# Patient Record
Sex: Male | Born: 1960 | Hispanic: Yes | Smoking: Current every day smoker
Health system: Southern US, Community
[De-identification: ages and names within clinical notes are randomized; demographics above are authoritative.]

## PROBLEM LIST (undated history)

## (undated) DIAGNOSIS — I259 Chronic ischemic heart disease, unspecified: Secondary | ICD-10-CM

## (undated) DIAGNOSIS — F329 Major depressive disorder, single episode, unspecified: Secondary | ICD-10-CM

## (undated) DIAGNOSIS — E7849 Other hyperlipidemia: Secondary | ICD-10-CM

## (undated) DIAGNOSIS — M1990 Unspecified osteoarthritis, unspecified site: Secondary | ICD-10-CM

## (undated) DIAGNOSIS — I255 Ischemic cardiomyopathy: Secondary | ICD-10-CM

## (undated) DIAGNOSIS — I509 Heart failure, unspecified: Secondary | ICD-10-CM

## (undated) DIAGNOSIS — K5792 Diverticulitis of intestine, part unspecified, without perforation or abscess without bleeding: Secondary | ICD-10-CM

## (undated) DIAGNOSIS — E119 Type 2 diabetes mellitus without complications: Secondary | ICD-10-CM

## (undated) DIAGNOSIS — I219 Acute myocardial infarction, unspecified: Secondary | ICD-10-CM

## (undated) DIAGNOSIS — F32A Depression, unspecified: Secondary | ICD-10-CM

## (undated) DIAGNOSIS — I2510 Atherosclerotic heart disease of native coronary artery without angina pectoris: Secondary | ICD-10-CM

## (undated) DIAGNOSIS — E785 Hyperlipidemia, unspecified: Secondary | ICD-10-CM

## (undated) DIAGNOSIS — I10 Essential (primary) hypertension: Secondary | ICD-10-CM

## (undated) DIAGNOSIS — A6920 Lyme disease, unspecified: Secondary | ICD-10-CM

## (undated) HISTORY — DX: Diverticulitis of intestine, part unspecified, without perforation or abscess without bleeding: K57.92

## (undated) HISTORY — PX: KNEE SURGERY: SHX244

## (undated) HISTORY — PX: HERNIA REPAIR: SHX51

## (undated) HISTORY — DX: Ischemic cardiomyopathy: I25.5

## (undated) HISTORY — DX: Other hyperlipidemia: E78.49

## (undated) HISTORY — PX: TONSILLECTOMY: SUR1361

## (undated) HISTORY — DX: Chronic ischemic heart disease, unspecified: I25.9

## (undated) HISTORY — DX: Atherosclerotic heart disease of native coronary artery without angina pectoris: I25.10

## (undated) HISTORY — DX: Heart failure, unspecified: I50.9

## (undated) HISTORY — PX: SMALL INTESTINE SURGERY: SHX150

## (undated) HISTORY — DX: Unspecified osteoarthritis, unspecified site: M19.90

## (undated) HISTORY — PX: JOINT REPLACEMENT: SHX530

## (undated) HISTORY — DX: Type 2 diabetes mellitus without complications: E11.9

## (undated) HISTORY — DX: Acute myocardial infarction, unspecified: I21.9

## (undated) HISTORY — DX: Lyme disease, unspecified: A69.20

## (undated) HISTORY — DX: Hyperlipidemia, unspecified: E78.5

## (undated) HISTORY — DX: Essential (primary) hypertension: I10

---

## 2010-10-29 HISTORY — PX: CARDIAC CATHETERIZATION: SHX172

## 2012-10-29 HISTORY — PX: CARDIAC CATHETERIZATION: SHX172

## 2016-10-29 DIAGNOSIS — A6920 Lyme disease, unspecified: Secondary | ICD-10-CM

## 2016-10-29 HISTORY — DX: Lyme disease, unspecified: A69.20

## 2017-06-11 ENCOUNTER — Encounter: Payer: Self-pay | Admitting: Family Medicine

## 2017-06-14 ENCOUNTER — Encounter: Payer: Self-pay | Admitting: Emergency Medicine

## 2017-06-14 ENCOUNTER — Ambulatory Visit (INDEPENDENT_AMBULATORY_CARE_PROVIDER_SITE_OTHER): Payer: Medicare Other | Admitting: Emergency Medicine

## 2017-06-14 VITALS — BP 112/74 | HR 73 | Temp 97.8°F | Resp 16 | Ht 71.0 in | Wt 184.8 lb

## 2017-06-14 DIAGNOSIS — I10 Essential (primary) hypertension: Secondary | ICD-10-CM

## 2017-06-14 DIAGNOSIS — G894 Chronic pain syndrome: Secondary | ICD-10-CM

## 2017-06-14 DIAGNOSIS — I2510 Atherosclerotic heart disease of native coronary artery without angina pectoris: Secondary | ICD-10-CM | POA: Diagnosis not present

## 2017-06-14 DIAGNOSIS — E785 Hyperlipidemia, unspecified: Secondary | ICD-10-CM | POA: Diagnosis not present

## 2017-06-14 DIAGNOSIS — I259 Chronic ischemic heart disease, unspecified: Secondary | ICD-10-CM

## 2017-06-14 DIAGNOSIS — E119 Type 2 diabetes mellitus without complications: Secondary | ICD-10-CM

## 2017-06-14 DIAGNOSIS — E1169 Type 2 diabetes mellitus with other specified complication: Secondary | ICD-10-CM | POA: Insufficient documentation

## 2017-06-14 DIAGNOSIS — Z96651 Presence of right artificial knee joint: Secondary | ICD-10-CM | POA: Diagnosis not present

## 2017-06-14 NOTE — Progress Notes (Signed)
Alyas Talamantez 56 y.o.   Chief Complaint  Patient presents with  . Establish Care    new to the area    HISTORY OF PRESENT ILLNESS: This is a 56 y.o. male recently moved from [place]; multiple stable medical problems. Has no complaints; also has h/o chronic pain syndrome taking Oxycodone several times a day.  HPI   Prior to Admission medications   Not on File    No Known Allergies  There are no active problems to display for this patient.   Past Medical History:  Diagnosis Date  . CAD (coronary artery disease)   . Diabetes mellitus without complication (HCC)   . Hyperlipidemia   . Hypertension   . IHD (ischemic heart disease)     Past Surgical History:  Procedure Laterality Date  . KNEE SURGERY Right     Social History   Social History  . Marital status: Married    Spouse name: N/A  . Number of children: N/A  . Years of education: N/A   Occupational History  . Not on file.   Social History Main Topics  . Smoking status: Current Every Day Smoker    Packs/day: 1.00    Years: 30.00    Types: Cigarettes  . Smokeless tobacco: Never Used  . Alcohol use No  . Drug use: No  . Sexual activity: Not on file   Other Topics Concern  . Not on file   Social History Narrative  . No narrative on file    Family History  Problem Relation Age of Onset  . Hyperlipidemia Mother      Review of Systems  Constitutional: Negative.  Negative for chills and fever.  HENT: Negative.   Eyes: Negative.   Respiratory: Negative for cough and shortness of breath.   Cardiovascular: Negative.  Negative for chest pain, palpitations and leg swelling.  Gastrointestinal: Negative for abdominal pain, diarrhea, nausea and vomiting.  Genitourinary: Negative.   Musculoskeletal: Negative.   Skin: Negative.  Negative for rash.  Neurological: Negative.  Negative for dizziness and headaches.  Endo/Heme/Allergies: Negative.   All other systems reviewed and are negative.  Vitals:   06/14/17 1045  BP: 112/74  Pulse: 73  Resp: 16  Temp: 97.8 F (36.6 C)  SpO2: 97%     Physical Exam  Constitutional: He is oriented to person, place, and time. He appears well-developed and well-nourished.  HENT:  Head: Normocephalic and atraumatic.  Nose: Nose normal.  Mouth/Throat: Oropharynx is clear and moist.  Eyes: Pupils are equal, round, and reactive to light. Conjunctivae and EOM are normal.  Neck: Normal range of motion. Neck supple. No JVD present. No thyromegaly present.  Cardiovascular: Normal rate, regular rhythm, normal heart sounds and intact distal pulses.   Pulmonary/Chest: Effort normal and breath sounds normal.  Abdominal: Soft. Bowel sounds are normal. He exhibits no distension. There is no tenderness.  Musculoskeletal: Normal range of motion.  Lymphadenopathy:    He has no cervical adenopathy.  Neurological: He is alert and oriented to person, place, and time. No sensory deficit. He exhibits normal muscle tone.  Skin: Skin is warm and dry. Capillary refill takes less than 2 seconds. No rash noted.  Psychiatric: He has a normal mood and affect. His behavior is normal.  Vitals reviewed.    ASSESSMENT & PLAN: Xzayvion was seen today for establish care.  Diagnoses and all orders for this visit:  CAD, multiple vessel -     Ambulatory referral to Cardiology  IHD (ischemic  heart disease) -     CBC with Differential/Platelet -     Comprehensive metabolic panel -     Ambulatory referral to Cardiology  Type 2 diabetes mellitus without complication, without long-term current use of insulin (HCC) -     Hemoglobin A1c  Essential hypertension -     CBC with Differential/Platelet -     Comprehensive metabolic panel  Dyslipidemia -     Lipid panel  History of right knee joint replacement -     Ambulatory referral to Orthopedic Surgery  Chronic pain syndrome -     Ambulatory referral to Pain Clinic     Patient Instructions       IF you  received an x-ray today, you will receive an invoice from [facility]. Please contact [facility] at [phone] with questions or concerns regarding your invoice.   IF you received labwork today, you will receive an invoice from [facility]. Please contact [facility] at [phone] with questions or concerns regarding your invoice.   Our billing staff will not be able to assist you with questions regarding bills from these companies.  You will be contacted with the lab results as soon as they are available. The fastest way to get your results is to activate your My Chart account. Instructions are located on the last page of this paperwork. If you have not heard from [place] regarding the results in 2 weeks, please contact this office.     Coronary Artery Disease, Male Coronary artery disease (CAD) is a condition in which the arteries that lead to the heart (coronary arteries) become narrow or blocked. The narrowing or blockage can lead to decreased blood flow to the heart. Prolonged reduced blood flow can cause a heart attack (myocardial infarction or MI). This condition may also be called coronary heart disease. Because CAD is the leading cause of death in men, it is important to understand what causes this condition and how it is treated. What are the causes? CAD is most often caused by atherosclerosis. This is the buildup of fat and cholesterol (plaque) on the inside of the arteries. Over time, the plaque may narrow or block the artery, reducing blood flow to the heart. Plaque can also become weak and break off within a coronary artery and cause a sudden blockage. Other less common causes of CAD include:  An embolism or blood clot in a coronary artery.  A tearing of the artery (spontaneous coronary artery dissection).  An aneurysm.  Inflammation (vasculitis) in the artery wall.  What increases the risk? The following factors may make you more likely to develop this  condition:  Age. Men over age 26 are at a greater risk of CAD.  Family history of CAD.  Gender. Men often develop CAD earlier in life than women.  High blood pressure (hypertension).  Diabetes.  High cholesterol levels.  Tobacco use.  Excessive alcohol use.  Lack of exercise.  A diet high in saturated and trans fats, such as fried food and processed meat.  Other possible risk factors include:  High stress levels.  Depression.  Obesity.  Sleep apnea.  What are the signs or symptoms? Many people do not have any symptoms during the early stages of CAD. As the condition progresses, symptoms may include:  Chest pain (angina). The pain can: ? Feel like a crushing or squeezing, or a tightness, pressure, fullness, or heaviness in the chest. ? Last more than a few minutes or can stop and recur. The pain  tends to get worse with exercise or stress and to fade with rest.  Pain in the arms, neck, jaw, or back.  Unexplained heartburn or indigestion.  Shortness of breath.  Nausea or vomiting.  Sudden light-headedness.  Sudden cold sweats.  Fluttering or fast heartbeat (palpitations).  How is this diagnosed? This condition is diagnosed based on:  Your family and medical history.  A physical exam.  Tests, including: ? A test to check the electrical signals in your heart (electrocardiogram). ? Exercise stress test. This looks for signs of blockage when the heart is stressed with exercise, such as running on a treadmill. ? Pharmacologic stress test. This test looks for signs of blockage when the heart is being stressed with a medicine. ? Blood tests. ? Coronary angiogram. This is a procedure to look at the coronary arteries to see if there is any blockage. During this test, a dye is injected into your arteries so they appear on an X-ray. ? A test that uses sound waves to take a picture of your heart (echocardiogram). ? Chest X-ray.  How is this treated? This  condition may be treated by:  Healthy lifestyle changes to reduce risk factors.  Medicines such as: ? Antiplatelet medicines and blood-thinning medicines, such as aspirin. These help to prevent blood clots. ? Nitroglycerin. ? Blood pressure medicines. ? Cholesterol-lowering medicine.  Coronary angioplasty and stenting. During this procedure, a thin, flexible tube is inserted through a blood vessel and into a blocked artery. A balloon or similar device on the end of the tube is inflated to open up the artery. In some cases, a small, mesh tube (stent) is inserted into the artery to keep it open.  Coronary artery bypass surgery. During this surgery, veins or arteries from other parts of the body are used to create a bypass around the blockage and allow blood to reach your heart.  Follow these instructions at home: Medicines  Take over-the-counter and prescription medicines only as told by your health care provider.  Do not take the following medicines unless your health care provider approves: ? NSAIDs, such as ibuprofen, naproxen, or celecoxib. ? Vitamin supplements that contain vitamin A, vitamin E, or both. Lifestyle  Follow an exercise program approved by your health care provider. Aim for 150 minutes of moderate exercise or 75 minutes of vigorous exercise each week.  Maintain a healthy weight or lose weight as approved by your health care provider.  Rest when you are tired.  Learn to manage stress or try to limit your stress. Ask your health care provider for suggestions if you need help.  Get screened for depression and seek treatment, if needed.  Do not use any products that contain nicotine or tobacco, such as cigarettes and e-cigarettes. If you need help quitting, ask your health care provider.  Do not use illegal drugs. Eating and drinking  Follow a heart-healthy diet. A dietitian can help educate you about healthy food options and changes. In general, eat plenty of  fruits and vegetables, lean meats, and whole grains.  Avoid foods high in: ? Sugar. ? Salt (sodium). ? Saturated fat, such as processed or fatty meat. ? Trans fat, such as fried foods.  Use healthy cooking methods such as roasting, grilling, broiling, baking, poaching, steaming, or stir-frying.  If you drink alcohol, and your health care provider approves, limit your alcohol intake to no more than 2 drinks per day. One drink equals 12 ounces of beer, 5 ounces of wine, or 1 ounces  of hard liquor. General instructions  Manage any other health conditions, such as hypertension and diabetes. These conditions affect your heart.  Your health care provider may ask you to monitor your blood pressure. Ideally, your blood pressure should be below 130/80.  Keep all follow-up visits as told by your health care provider. This is important. Get help right away if:  You have pain in your chest, neck, arm, jaw, stomach, or back that: ? Lasts more than a few minutes. ? Is recurring. ? Is not relieved by taking medicine under your tongue (sublingualnitroglycerin).  You have too much (profuse) sweating without cause.  You have unexplained: ? Heartburn or indigestion. ? Shortness of breath or difficulty breathing. ? Fluttering or fast heartbeat (palpitations). ? Nausea or vomiting. ? Fatigue. ? Feelings of nervousness or anxiety. ? Weakness. ? Diarrhea.  You have sudden light-headedness or dizziness.  You faint.  You feel like hurting yourself or think about taking your own life. These symptoms may represent a serious problem that is an emergency. Do not wait to see if the symptoms will go away. Get medical help right away. Call your local emergency services (911 in the [place] Do not drive yourself to the hospital. Summary  Coronary artery disease (CAD) is a process in which the arteries that lead to the heart (coronary arteries) become narrow or blocked. The narrowing or blockage can lead  to a heart attack.  Many people do not have any symptoms during the early stages of CAD. This is called "silent CAD."  CAD can be treated with lifestyle changes, medicines, surgery, or a combination of these treatments. This information is not intended to replace advice given to you by your health care provider. Make sure you discuss any questions you have with your health care provider. Document Released: 05/12/2014 Document Revised: 10/05/2016 Document Reviewed: 10/05/2016 Elsevier Interactive Patient Education  2017 [employer] Inc.      Edwina Barth, MD [facility]

## 2017-06-14 NOTE — Patient Instructions (Addendum)
IF you received an x-ray today, you will receive an invoice from [facility]. Please contact [facility] at [phone] with questions or concerns regarding your invoice.   IF you received labwork today, you will receive an invoice from [facility]. Please contact [facility] at [phone] with questions or concerns regarding your invoice.   Our billing staff will not be able to assist you with questions regarding bills from these companies.  You will be contacted with the lab results as soon as they are available. The fastest way to get your results is to activate your My Chart account. Instructions are located on the last page of this paperwork. If you have not heard from [place] regarding the results in 2 weeks, please contact this office.     Coronary Artery Disease, Male Coronary artery disease (CAD) is a condition in which the arteries that lead to the heart (coronary arteries) become narrow or blocked. The narrowing or blockage can lead to decreased blood flow to the heart. Prolonged reduced blood flow can cause a heart attack (myocardial infarction or MI). This condition may also be called coronary heart disease. Because CAD is the leading cause of death in men, it is important to understand what causes this condition and how it is treated. What are the causes? CAD is most often caused by atherosclerosis. This is the buildup of fat and cholesterol (plaque) on the inside of the arteries. Over time, the plaque may narrow or block the artery, reducing blood flow to the heart. Plaque can also become weak and break off within a coronary artery and cause a sudden blockage. Other less common causes of CAD include:  An embolism or blood clot in a coronary artery.  A tearing of the artery (spontaneous coronary artery dissection).  An aneurysm.  Inflammation (vasculitis) in the artery wall.  What increases the risk? The following factors may make you more likely to develop  this condition:  Age. Men over age 56 are at a greater risk of CAD.  Family history of CAD.  Gender. Men often develop CAD earlier in life than women.  High blood pressure (hypertension).  Diabetes.  High cholesterol levels.  Tobacco use.  Excessive alcohol use.  Lack of exercise.  A diet high in saturated and trans fats, such as fried food and processed meat.  Other possible risk factors include:  High stress levels.  Depression.  Obesity.  Sleep apnea.  What are the signs or symptoms? Many people do not have any symptoms during the early stages of CAD. As the condition progresses, symptoms may include:  Chest pain (angina). The pain can: ? Feel like a crushing or squeezing, or a tightness, pressure, fullness, or heaviness in the chest. ? Last more than a few minutes or can stop and recur. The pain tends to get worse with exercise or stress and to fade with rest.  Pain in the arms, neck, jaw, or back.  Unexplained heartburn or indigestion.  Shortness of breath.  Nausea or vomiting.  Sudden light-headedness.  Sudden cold sweats.  Fluttering or fast heartbeat (palpitations).  How is this diagnosed? This condition is diagnosed based on:  Your family and medical history.  A physical exam.  Tests, including: ? A test to check the electrical signals in your heart (electrocardiogram). ? Exercise stress test. This looks for signs of blockage when the heart is stressed with exercise, such as running on a treadmill. ? Pharmacologic stress test. This test looks for signs of  blockage when the heart is being stressed with a medicine. ? Blood tests. ? Coronary angiogram. This is a procedure to look at the coronary arteries to see if there is any blockage. During this test, a dye is injected into your arteries so they appear on an X-ray. ? A test that uses sound waves to take a picture of your heart (echocardiogram). ? Chest X-ray.  How is this treated? This  condition may be treated by:  Healthy lifestyle changes to reduce risk factors.  Medicines such as: ? Antiplatelet medicines and blood-thinning medicines, such as aspirin. These help to prevent blood clots. ? Nitroglycerin. ? Blood pressure medicines. ? Cholesterol-lowering medicine.  Coronary angioplasty and stenting. During this procedure, a thin, flexible tube is inserted through a blood vessel and into a blocked artery. A balloon or similar device on the end of the tube is inflated to open up the artery. In some cases, a small, mesh tube (stent) is inserted into the artery to keep it open.  Coronary artery bypass surgery. During this surgery, veins or arteries from other parts of the body are used to create a bypass around the blockage and allow blood to reach your heart.  Follow these instructions at home: Medicines  Take over-the-counter and prescription medicines only as told by your health care provider.  Do not take the following medicines unless your health care provider approves: ? NSAIDs, such as ibuprofen, naproxen, or celecoxib. ? Vitamin supplements that contain vitamin A, vitamin E, or both. Lifestyle  Follow an exercise program approved by your health care provider. Aim for 150 minutes of moderate exercise or 75 minutes of vigorous exercise each week.  Maintain a healthy weight or lose weight as approved by your health care provider.  Rest when you are tired.  Learn to manage stress or try to limit your stress. Ask your health care provider for suggestions if you need help.  Get screened for depression and seek treatment, if needed.  Do not use any products that contain nicotine or tobacco, such as cigarettes and e-cigarettes. If you need help quitting, ask your health care provider.  Do not use illegal drugs. Eating and drinking  Follow a heart-healthy diet. A dietitian can help educate you about healthy food options and changes. In general, eat plenty of  fruits and vegetables, lean meats, and whole grains.  Avoid foods high in: ? Sugar. ? Salt (sodium). ? Saturated fat, such as processed or fatty meat. ? Trans fat, such as fried foods.  Use healthy cooking methods such as roasting, grilling, broiling, baking, poaching, steaming, or stir-frying.  If you drink alcohol, and your health care provider approves, limit your alcohol intake to no more than 2 drinks per day. One drink equals 12 ounces of beer, 5 ounces of wine, or 1 ounces of hard liquor. General instructions  Manage any other health conditions, such as hypertension and diabetes. These conditions affect your heart.  Your health care provider may ask you to monitor your blood pressure. Ideally, your blood pressure should be below 130/80.  Keep all follow-up visits as told by your health care provider. This is important. Get help right away if:  You have pain in your chest, neck, arm, jaw, stomach, or back that: ? Lasts more than a few minutes. ? Is recurring. ? Is not relieved by taking medicine under your tongue (sublingualnitroglycerin).  You have too much (profuse) sweating without cause.  You have unexplained: ? Heartburn or indigestion. ? Shortness of  breath or difficulty breathing. ? Fluttering or fast heartbeat (palpitations). ? Nausea or vomiting. ? Fatigue. ? Feelings of nervousness or anxiety. ? Weakness. ? Diarrhea.  You have sudden light-headedness or dizziness.  You faint.  You feel like hurting yourself or think about taking your own life. These symptoms may represent a serious problem that is an emergency. Do not wait to see if the symptoms will go away. Get medical help right away. Call your local emergency services (911 in the [place] Do not drive yourself to the hospital. Summary  Coronary artery disease (CAD) is a process in which the arteries that lead to the heart (coronary arteries) become narrow or blocked. The narrowing or blockage can lead  to a heart attack.  Many people do not have any symptoms during the early stages of CAD. This is called "silent CAD."  CAD can be treated with lifestyle changes, medicines, surgery, or a combination of these treatments. This information is not intended to replace advice given to you by your health care provider. Make sure you discuss any questions you have with your health care provider. Document Released: 05/12/2014 Document Revised: 10/05/2016 Document Reviewed: 10/05/2016 Elsevier Interactive Patient Education  2017 [employer].

## 2017-06-15 LAB — HEMOGLOBIN A1C
ESTIMATED AVERAGE GLUCOSE: 131 mg/dL
HEMOGLOBIN A1C: 6.2 % — AB (ref 4.8–5.6)

## 2017-06-15 LAB — COMPREHENSIVE METABOLIC PANEL
A/G RATIO: 2 (ref 1.2–2.2)
ALBUMIN: 5.1 g/dL (ref 3.5–5.5)
ALT: 18 IU/L (ref 0–44)
AST: 19 IU/L (ref 0–40)
Alkaline Phosphatase: 122 IU/L — ABNORMAL HIGH (ref 39–117)
BILIRUBIN TOTAL: 0.9 mg/dL (ref 0.0–1.2)
BUN / CREAT RATIO: 16 (ref 9–20)
BUN: 13 mg/dL (ref 6–24)
CALCIUM: 10.4 mg/dL — AB (ref 8.7–10.2)
CHLORIDE: 101 mmol/L (ref 96–106)
CO2: 21 mmol/L (ref 20–29)
Creatinine, Ser: 0.82 mg/dL (ref 0.76–1.27)
GFR, EST AFRICAN AMERICAN: 115 mL/min/{1.73_m2} (ref >59)
GFR, EST NON AFRICAN AMERICAN: 100 mL/min/{1.73_m2} (ref >59)
GLUCOSE: 124 mg/dL — AB (ref 65–99)
Globulin, Total: 2.6 g/dL (ref 1.5–4.5)
Potassium: 4.6 mmol/L (ref 3.5–5.2)
Sodium: 145 mmol/L — ABNORMAL HIGH (ref 134–144)
TOTAL PROTEIN: 7.7 g/dL (ref 6.0–8.5)

## 2017-06-15 LAB — CBC WITH DIFFERENTIAL/PLATELET
BASOS ABS: 0 10*3/uL (ref 0.0–0.2)
Basos: 0 %
EOS (ABSOLUTE): 0.1 10*3/uL (ref 0.0–0.4)
Eos: 1 %
HEMOGLOBIN: 17 g/dL (ref 13.0–17.7)
Hematocrit: 49 % (ref 37.5–51.0)
IMMATURE GRANS (ABS): 0 10*3/uL (ref 0.0–0.1)
Immature Granulocytes: 0 %
LYMPHS: 29 %
Lymphocytes Absolute: 2 10*3/uL (ref 0.7–3.1)
MCH: 31 pg (ref 26.6–33.0)
MCHC: 34.7 g/dL (ref 31.5–35.7)
MCV: 89 fL (ref 79–97)
MONOCYTES: 7 %
Monocytes Absolute: 0.5 10*3/uL (ref 0.1–0.9)
NEUTROS PCT: 63 %
Neutrophils Absolute: 4.3 10*3/uL (ref 1.4–7.0)
PLATELETS: 234 10*3/uL (ref 150–379)
RBC: 5.49 x10E6/uL (ref 4.14–5.80)
RDW: 13.4 % (ref 12.3–15.4)
WBC: 6.8 10*3/uL (ref 3.4–10.8)

## 2017-06-15 LAB — LIPID PANEL
CHOL/HDL RATIO: 4.5 ratio (ref 0.0–5.0)
Cholesterol, Total: 154 mg/dL (ref 100–199)
HDL: 34 mg/dL — AB (ref >39)
LDL Calculated: 85 mg/dL (ref 0–99)
Triglycerides: 175 mg/dL — ABNORMAL HIGH (ref 0–149)
VLDL CHOLESTEROL CAL: 35 mg/dL (ref 5–40)

## 2017-06-26 ENCOUNTER — Telehealth: Payer: Self-pay | Admitting: Emergency Medicine

## 2017-06-26 NOTE — Telephone Encounter (Signed)
Don't know the answer but I'm sure patient knows.

## 2017-06-26 NOTE — Telephone Encounter (Signed)
I tried calling the pt to get this information but call would not go through. Will try again later. I let Kathie RhodesBetty know we did not have this information, but they are needing it before scheduling pt.

## 2017-06-26 NOTE — Telephone Encounter (Signed)
Betty from [facility] called stating that Dr. Jordan LikesSpivey at their office needs to know the dosage/frequency of oxycodone that pt takes. Please advise. Kathie RhodesBetty can be reached at [phone].

## 2017-06-27 NOTE — Telephone Encounter (Signed)
Call placed to patient, wife answered phone and would not allow staff to speak with patient. Patient wife states that patient has upcoming visit with new PCP, will not need referral. Patient wife then hung up./ S.Kyjuan Gause,CMA

## 2017-06-28 NOTE — Telephone Encounter (Signed)
Left message for John Mathis at [facility] to disregard referral. Will close this referral. Thanks.

## 2017-06-29 NOTE — Telephone Encounter (Signed)
OK 

## 2017-07-16 ENCOUNTER — Ambulatory Visit: Payer: Self-pay | Admitting: Unknown Physician Specialty

## 2017-08-09 ENCOUNTER — Encounter: Payer: Self-pay | Admitting: Family Medicine

## 2017-08-09 ENCOUNTER — Ambulatory Visit (INDEPENDENT_AMBULATORY_CARE_PROVIDER_SITE_OTHER): Payer: Medicare Other | Admitting: Family Medicine

## 2017-08-09 VITALS — BP 116/72 | HR 75 | Temp 98.3°F | Ht 70.0 in | Wt 186.5 lb

## 2017-08-09 DIAGNOSIS — Z7689 Persons encountering health services in other specified circumstances: Secondary | ICD-10-CM | POA: Diagnosis not present

## 2017-08-09 DIAGNOSIS — E119 Type 2 diabetes mellitus without complications: Secondary | ICD-10-CM

## 2017-08-09 DIAGNOSIS — Z79891 Long term (current) use of opiate analgesic: Secondary | ICD-10-CM

## 2017-08-09 DIAGNOSIS — I2510 Atherosclerotic heart disease of native coronary artery without angina pectoris: Secondary | ICD-10-CM | POA: Diagnosis not present

## 2017-08-09 DIAGNOSIS — I10 Essential (primary) hypertension: Secondary | ICD-10-CM

## 2017-08-09 DIAGNOSIS — G8929 Other chronic pain: Secondary | ICD-10-CM | POA: Diagnosis not present

## 2017-08-09 DIAGNOSIS — Z96652 Presence of left artificial knee joint: Secondary | ICD-10-CM | POA: Diagnosis not present

## 2017-08-09 DIAGNOSIS — M25562 Pain in left knee: Secondary | ICD-10-CM

## 2017-08-09 DIAGNOSIS — Z720 Tobacco use: Secondary | ICD-10-CM

## 2017-08-09 NOTE — Patient Instructions (Signed)
I will put in referral to pain management, orthopedics and cardiology  Please follow up in 6 months  Consider working to stop smoking completely   Steps to Quit Smoking Smoking tobacco can be harmful to your health and can affect almost every organ in your body. Smoking puts you, and those around you, at risk for developing many serious chronic diseases. Quitting smoking is difficult, but it is one of the best things that you can do for your health. It is never too late to quit. What are the benefits of quitting smoking? When you quit smoking, you lower your risk of developing serious diseases and conditions, such as:  Lung cancer or lung disease, such as COPD.  Heart disease.  Stroke.  Heart attack.  Infertility.  Osteoporosis and bone fractures.  Additionally, symptoms such as coughing, wheezing, and shortness of breath may get better when you quit. You may also find that you get sick less often because your body is stronger at fighting off colds and infections. If you are pregnant, quitting smoking can help to reduce your chances of having a baby of low birth weight. How do I get ready to quit? When you decide to quit smoking, create a plan to make sure that you are successful. Before you quit:  Pick a date to quit. Set a date within the next two weeks to give you time to prepare.  Write down the reasons why you are quitting. Keep this list in places where you will see it often, such as on your bathroom mirror or in your car or wallet.  Identify the people, places, things, and activities that make you want to smoke (triggers) and avoid them. Make sure to take these actions: ? Throw away all cigarettes at home, at work, and in your car. ? Throw away smoking accessories, such as Set designer. ? Clean your car and make sure to empty the ashtray. ? Clean your home, including curtains and carpets.  Tell your family, friends, and coworkers that you are quitting. Support  from your loved ones can make quitting easier.  Talk with your health care provider about your options for quitting smoking.  Find out what treatment options are covered by your health insurance.  What strategies can I use to quit smoking? Talk with your healthcare provider about different strategies to quit smoking. Some strategies include:  Quitting smoking altogether instead of gradually lessening how much you smoke over a period of time. Research shows that quitting "cold [place]" is more successful than gradually quitting.  Attending in-person counseling to help you build problem-solving skills. You are more likely to have success in quitting if you attend several counseling sessions. Even short sessions of 10 minutes can be effective.  Finding resources and support systems that can help you to quit smoking and remain smoke-free after you quit. These resources are most helpful when you use them often. They can include: ? Online chats with a Veterinary surgeon. ? Telephone quitlines. ? Automotive engineer. ? Support groups or group counseling. ? Text messaging programs. ? Mobile phone applications.  Taking medicines to help you quit smoking. (If you are pregnant or breastfeeding, talk with your health care provider first.) Some medicines contain nicotine and some do not. Both types of medicines help with cravings, but the medicines that include nicotine help to relieve withdrawal symptoms. Your health care provider may recommend: ? Nicotine patches, gum, or lozenges. ? Nicotine inhalers or sprays. ? Non-nicotine medicine that is taken by  mouth.  Talk with your health care provider about combining strategies, such as taking medicines while you are also receiving in-person counseling. Using these two strategies together makes you more likely to succeed in quitting than if you used either strategy on its own. If you are pregnant or breastfeeding, talk with your health care provider about  finding counseling or other support strategies to quit smoking. Do not take medicine to help you quit smoking unless told to do so by your health care provider. What things can I do to make it easier to quit? Quitting smoking might feel overwhelming at first, but there is a lot that you can do to make it easier. Take these important actions:  Reach out to your family and friends and ask that they support and encourage you during this time. Call telephone quitlines, reach out to support groups, or work with a counselor for support.  Ask people who smoke to avoid smoking around you.  Avoid places that trigger you to smoke, such as bars, parties, or smoke-break areas at work.  Spend time around people who do not smoke.  Lessen stress in your life, because stress can be a smoking trigger for some people. To lessen stress, try: ? Exercising regularly. ? Deep-breathing exercises. ? Yoga. ? Meditating. ? Performing a body scan. This involves closing your eyes, scanning your body from head to toe, and noticing which parts of your body are particularly tense. Purposefully relax the muscles in those areas.  Download or purchase mobile phone or tablet apps (applications) that can help you stick to your quit plan by providing reminders, tips, and encouragement. There are many free apps, such as QuitGuide from the [employer] developer for Disease Control and Prevention). You can find other support for quitting smoking (smoking cessation) through smokefree.gov and other websites.  How will I feel when I quit smoking? Within the first 24 hours of quitting smoking, you may start to feel some withdrawal symptoms. These symptoms are usually most noticeable 2-3 days after quitting, but they usually do not last beyond 2-3 weeks. Changes or symptoms that you might experience include:  Mood swings.  Restlessness, anxiety, or irritation.  Difficulty concentrating.  Dizziness.  Strong cravings for sugary foods in  addition to nicotine.  Mild weight gain.  Constipation.  Nausea.  Coughing or a sore throat.  Changes in how your medicines work in your body.  A depressed mood.  Difficulty sleeping (insomnia).  After the first 2-3 weeks of quitting, you may start to notice more positive results, such as:  Improved sense of smell and taste.  Decreased coughing and sore throat.  Slower heart rate.  Lower blood pressure.  Clearer skin.  The ability to breathe more easily.  Fewer sick days.  Quitting smoking is very challenging for most people. Do not get discouraged if you are not successful the first time. Some people need to make many attempts to quit before they achieve long-term success. Do your best to stick to your quit plan, and talk with your health care provider if you have any questions or concerns. This information is not intended to replace advice given to you by your health care provider. Make sure you discuss any questions you have with your health care provider. Document Released: 10/09/2001 Document Revised: 06/12/2016 Document Reviewed: 03/01/2015 Elsevier Interactive Patient Education  2017 [employer].

## 2017-08-09 NOTE — Progress Notes (Signed)
Subjective:    Patient ID: John Mathis, male    DOB: November 20, 1960, 56 y.o.   MRN: [id number]  HPI This is a 56 yo male who presents today to establish care. Recently moved to [place] from [place]. Lives with your wife and youngest child (52, male). Enjoys swimming, is retired, walks his dogs for exercise.   Was seen by Dr. Alvy Bimler (PCP) 8/18 to establish care but decided to not stay with him because his wife didn't like the doctor's bedside manner. Dr. Alvy Bimler put in referral to cardiology, pain management and orthopedic surgery to address patient's CAD, long term opioid use and chronic right knee pain s/p knee replacement x 2. Patient's wife told staff at PCP to cancel referrals that patient was establishing with new PCP.   Chronic pain- He is having pain medication filled by  PCP MD in [place]. Takes oxycodone for pain at knee joint replacement site. Last joint replacement (#2) was March 2017. He also takes Ambien occasionally for sleep, states he hasn't needed since moving to [place]. His wife asked that I refill his alprazolam prescription because he has been taking hers. I do not see on his med list or in [employer]. Database showed oxycodone 15 mg #300 filled 07/24/17, zolpidem 5 mg # 30 filled 07/22/17, prior fills- oxycodone 15 mg  #300 filled 06/26/17, hydrocodone-acetaminophen 7.5-325 # 18 filled 06/21/17, zolpidem 5 mg #30 filled 06/10/17.   CAD/CHF- reported by patient. He denies any current chest pain/sob/leg swelling. Smokes 1/2 ppd, has reduced from 1 ppd since his move. Takes ASA, atorvastatin, lisinopril, metoprolol and clopidogrel. Lipid panel 06/14/17 showed total cholesterol 154, triglycerides 175, HDL 34, LDL 85, chol/HDL ratio 4.5.   DM type 2- takes metformin 500 mg bid. HgbA1C was 6.2 06/14/17.    Past Medical History:  Diagnosis Date  . Arthritis   . CAD (coronary artery disease)   . CHF (congestive heart failure) (HCC)   . Diabetes mellitus without complication (HCC)   . Hyperlipidemia    . Hypertension   . IHD (ischemic heart disease)   . Myocardial infarction [facility])    Past Surgical History:  Procedure Laterality Date  . HERNIA REPAIR    . JOINT REPLACEMENT    . KNEE SURGERY Right   . SMALL INTESTINE SURGERY     Family History  Problem Relation Age of Onset  . Hyperlipidemia Mother   . Mental illness Maternal Grandmother    Social History  Substance Use Topics  . Smoking status: Current Every Day Smoker    Packs/day: 1.00    Years: 30.00    Types: Cigarettes  . Smokeless tobacco: Never Used  . Alcohol use No      Review of Systems Per HPI    Objective:   Physical Exam Physical Exam  Constitutional: Oriented to person, place, and time. He appears well-developed and well-nourished.  HENT:  Head: Normocephalic and atraumatic.  Eyes: Conjunctivae are normal.  Neck: Normal range of motion. Neck supple.  Cardiovascular: Normal rate, regular rhythm and normal heart sounds.   Pulmonary/Chest: Effort normal and breath sounds normal.  Musculoskeletal: Normal range of motion.  Neurological: Alert and oriented to person, place, and time.  Skin: Skin is warm and dry.  Psychiatric: Normal mood and affect. Behavior is normal. Judgment and thought content normal.  Vitals reviewed.     BP 116/72   Pulse 75   Temp 98.3 F (36.8 C) (Oral)   Ht  (1.778 m)   Wt  186 lb 8 oz (84.6 kg)   SpO2 96%   BMI 26.76 kg/m  Wt Readings from Last 3 Encounters:  08/09/17 186 lb 8 oz (84.6 kg)  06/14/17 184 lb 12.8 oz (83.8 kg)   BP Readings from Last 3 Encounters:  08/09/17 116/72  06/14/17 112/74        Assessment & Plan:  1. Encounter to establish care - will request records from previous providers in [place]   2. CAD, multiple vessel - Ambulatory referral to Cardiology  3. Chronic knee pain after total replacement of left knee joint - Ambulatory referral to Pain Clinic - Ambulatory referral to Orthopedic Surgery  4. Type 2 diabetes mellitus without  complication, without long-term current use of insulin (HCC) - continue metformin  5. Essential hypertension - good control on current meds, continue lisinopril and metoprolol - Ambulatory referral to Cardiology  6. Long term (current) use of opiate analgesic - informed him that I would not write for any controlled substances for him, advised him against taking zolpidem, alprazolam with his oxycodone due to risk of accidental overdose/respiratory depression - Ambulatory referral to Pain Clinic - Ambulatory referral to Orthopedic Surgery  7. Tobacco abuse - has cut back on smoking, encouraged him to stop, he is not ready at this time  - follow up in 6 months  Olean Ree, FNP-BC  [facility], [facility]  08/13/2017 9:51 AM

## 2017-08-13 ENCOUNTER — Telehealth: Payer: Self-pay

## 2017-08-13 DIAGNOSIS — Z79891 Long term (current) use of opiate analgesic: Secondary | ICD-10-CM | POA: Insufficient documentation

## 2017-08-13 DIAGNOSIS — Z720 Tobacco use: Secondary | ICD-10-CM | POA: Insufficient documentation

## 2017-08-13 NOTE — Telephone Encounter (Signed)
pts wife (DPR signed) said that pt had anxiety attack last night and she does not want to take pt to ED. Request alprazolam refilled; advised per 08/09/17 office note that D Leone Payor FNP will not prescribe controlled substances and advised against taking alprazolam due to risk of overdose or respiratory depression since taking oxycodone also. Mrs Solivan voiced understanding and then said what can she do for her husband; she understands will take time to get appt with pain clinic. Advised if needed to take pt to ED. pts wife voiced understanding. FYI to Harlin Heys FNP. I ran this past Allayne Gitelman NP and she advised pt could try deep breathing exercises and trying to relax.

## 2017-09-26 NOTE — Telephone Encounter (Signed)
Noted  

## 2017-10-02 ENCOUNTER — Ambulatory Visit: Payer: Medicare Other | Admitting: Internal Medicine

## 2017-10-02 ENCOUNTER — Encounter: Payer: Self-pay | Admitting: Internal Medicine

## 2017-10-02 VITALS — BP 121/81 | HR 64 | Ht 71.0 in | Wt 187.0 lb

## 2017-10-02 DIAGNOSIS — I5022 Chronic systolic (congestive) heart failure: Secondary | ICD-10-CM

## 2017-10-02 DIAGNOSIS — I255 Ischemic cardiomyopathy: Secondary | ICD-10-CM

## 2017-10-02 DIAGNOSIS — E785 Hyperlipidemia, unspecified: Secondary | ICD-10-CM

## 2017-10-02 DIAGNOSIS — I2510 Atherosclerotic heart disease of native coronary artery without angina pectoris: Secondary | ICD-10-CM

## 2017-10-02 MED ORDER — LISINOPRIL 5 MG PO TABS: 5.0000 mg | ORAL_TABLET | Freq: Every day | ORAL | 3 refills | Status: DC

## 2017-10-02 MED ORDER — CLOPIDOGREL BISULFATE 75 MG PO TABS: 75.0000 mg | ORAL_TABLET | Freq: Every day | ORAL | 3 refills | Status: DC

## 2017-10-02 MED ORDER — ATORVASTATIN CALCIUM 80 MG PO TABS: 80.0000 mg | ORAL_TABLET | Freq: Every day | ORAL | 3 refills | Status: DC

## 2017-10-02 MED ORDER — METOPROLOL SUCCINATE ER 25 MG PO TB24: 25.0000 mg | ORAL_TABLET | Freq: Every day | ORAL | 3 refills | Status: DC

## 2017-10-02 NOTE — Patient Instructions (Addendum)
Medication Instructions:  Your physician has recommended you make the following change in your medication:  1- INCREASE Lipitor to 80 mg by mouth once a day.   Labwork: Your physician recommends that you return for lab work in: 3 MONTHS AT MEDICAL MALL (LIPID, ALT) - ON OR AROUND December 31, 2017. - YOU WILL NEED TO BE FASTING. - Please go to the [facility]. You will check in at the front desk to the right as you walk into the atrium. Valet Parking is offered if needed.    Testing/Procedures: none  Follow-Up: Your physician recommends that you schedule a follow-up appointment in: 6 MONTHS WITH DR END.   If you need a refill on your cardiac medications before your next appointment, please call your pharmacy.

## 2017-10-02 NOTE — Progress Notes (Addendum)
New Outpatient Visit Date: 10/02/2017  Referring Provider: Emi BelfastGessner, Deborah B, FNP [address] [place], [place] [id number]  Chief Complaint: Establish cardiology care with history of coronary artery disease and ischemic cardiomyopathy  HPI:  Mr. Lindaann SloughGulin is a 56 y.o. male who is being seen today for the evaluation of coronary artery disease and ischemic cardiomyopathy at the request of Ms. Leone PayorGessner. He has a history of CAD with report of 14 prior stent placements while living in [place], ischemic cardiomyopathy with LVEF of 30-35% by echo in 2017, hypertension, hyperlipidemia, diabetes mellitus, and Lyme disease.  Mr. Lindaann SloughGulin reports having his first heart attack around 2012.  He believes that he had to 90% blockages and was treated with a total of 4 stents.  Two years later, he had recurrence of chest pain and ultimately needed with 10 additional stent that month.  Since that time, he has done well.  He moved to [place] in September after retiring from the [employer] with [place].   Today, Mr. Lindaann SloughGulin reports feeling very well.  He has not had any chest pain, shortness of breath, palpitations, lightheadedness, orthopnea, PND, and edema.  He has occasional muscle cramps involving his chest wall, though these are nonexertional and typically only last a few minutes.  He remains compliant with his medications.  He walks regularly, albeit usually not for more than 1/4 mile at a time.  He does not have any limitations with this.  He denies ever being told that he needed an ICD.  --------------------------------------------------------------------------------------------------  Cardiovascular History & Procedures: Cardiovascular Problems:  Coronary artery disease status post multiple PCI's  Chronic systolic heart failure secondary to ischemic cardiomyopathy  Risk Factors:  Known coronary artery disease, hypertension, hyperlipidemia, diabetes mellitus, male  gender, and age greater than 1355  Cath/PCI:  None available.  Patient reports a total of 14 stents from 2012 through 2014  CV Surgery:  None  EP Procedures and Devices:  None  Non-Invasive Evaluation(s):  TTE ([facility]/18/16, [facility], [place], [place] Normal LV size.  LVEF 30-35% with inferior hypokinesis.  Trace aortic and mild mitral regurgitation.  Trace to mild tricuspid regurgitation.  Normal RV size and function.  Recent CV Pertinent Labs: Lab Results  Component Value Date   CHOL 154 06/14/2017   HDL 34 (L) 06/14/2017   LDLCALC 85 06/14/2017   TRIG 175 (H) 06/14/2017   CHOLHDL 4.5 06/14/2017   K 4.6 06/14/2017   BUN 13 06/14/2017   CREATININE 0.82 06/14/2017    --------------------------------------------------------------------------------------------------  Past Medical History:  Diagnosis Date  . Arthritis   . CAD (coronary artery disease)   . CHF (congestive heart failure) (HCC)   . Diabetes mellitus without complication (HCC)   . Hyperlipidemia   . Hypertension   . IHD (ischemic heart disease)   . Lyme disease   . Myocardial infarction [facility](HCC)     Past Surgical History:  Procedure Laterality Date  . CARDIAC CATHETERIZATION  2012   x4 stents  . CARDIAC CATHETERIZATION  2014   x6 stents  . CARDIAC CATHETERIZATION  2014   x2 stents  . HERNIA REPAIR    . JOINT REPLACEMENT    . KNEE SURGERY Right   . SMALL INTESTINE SURGERY      Current Meds  Medication Sig  . aspirin EC 81 MG tablet Take 81 mg by mouth daily.  . clopidogrel (PLAVIX) 75 MG tablet Take 1 tablet (75 mg total) by  mouth daily.  Marland Kitchen lisinopril (PRINIVIL,ZESTRIL) 5 MG tablet Take 1 tablet (5 mg total) by mouth daily.  . metFORMIN (GLUCOPHAGE) 500 MG tablet Take by mouth 2 (two) times daily with a meal.  . metoprolol succinate (TOPROL-XL) 25 MG 24 hr tablet Take 1 tablet (25 mg total) by mouth daily.  Marland Kitchen oxyCODONE (ROXICODONE) 15 MG immediate release tablet Take 14 mg by  mouth. Take 2 tablets by mouth 4 times a day. Take 2 tablets at bedtime  . zolpidem (AMBIEN) 5 MG tablet Take 5 mg by mouth at bedtime as needed. for sleep  . [DISCONTINUED] atorvastatin (LIPITOR) 40 MG tablet Take 40 mg by mouth daily.  . [DISCONTINUED] clopidogrel (PLAVIX) 75 MG tablet Take 75 mg by mouth daily.  . [DISCONTINUED] lisinopril (PRINIVIL,ZESTRIL) 5 MG tablet Take 5 mg by mouth daily.  . [DISCONTINUED] metoprolol succinate (TOPROL-XL) 25 MG 24 hr tablet Take 25 mg by mouth daily.    Allergies: Ivp dye [iodinated diagnostic agents]  Social History   Socioeconomic History  . Marital status: Married    Spouse name: Not on file  . Number of children: Not on file  . Years of education: Not on file  . Highest education level: Not on file  Social Needs  . Financial resource strain: Not on file  . Food insecurity - worry: Not on file  . Food insecurity - inability: Not on file  . Transportation needs - medical: Not on file  . Transportation needs - non-medical: Not on file  Occupational History  . Not on file  Tobacco Use  . Smoking status: Current Every Day Smoker    Packs/day: 0.50    Years: 30.00    Pack years: 15.00    Types: Cigarettes  . Smokeless tobacco: Never Used  Substance and Sexual Activity  . Alcohol use: No  . Drug use: No  . Sexual activity: No  Other Topics Concern  . Not on file  Social History Narrative  . Not on file    Family History  Problem Relation Age of Onset  . Hyperlipidemia Mother   . Other Father        parasite infection  . Mental illness Maternal Grandmother     Review of Systems: A 12-system review of systems was performed and was negative except as noted in the HPI.  --------------------------------------------------------------------------------------------------  Physical Exam: BP 121/81 (BP Location: Right Arm, Patient Position: Sitting, Cuff Size: Normal)   Pulse 64   Ht 5\' 11"  (1.803 m)   Wt 187 lb (84.8 kg)    BMI 26.08 kg/m   General: Well-developed, well-nourished man, seated comfortably in the exam room. HEENT: No conjunctival pallor or scleral icterus. Moist mucous membranes. OP clear. Neck: Supple without lymphadenopathy, thyromegaly, JVD, or HJR. No carotid bruit. Lungs: Normal work of breathing. Clear to auscultation bilaterally without wheezes or crackles. Heart: Regular rate and rhythm without murmurs, rubs, or gallops. Non-displaced PMI. Abd: Bowel sounds present. Soft, NT/ND without hepatosplenomegaly Ext: No lower extremity edema. Radial, PT, and DP pulses are 2+ bilaterally Skin: Warm and dry without rash. Neuro: CNIII-XII intact. Strength and fine-touch sensation intact in upper and lower extremities bilaterally. Psych: Normal mood and affect.  EKG: Normal sinus rhythm with baseline wander.  No significant abnormalities.  Lab Results  Component Value Date   WBC 6.8 06/14/2017   HGB 17.0 06/14/2017   HCT 49.0 06/14/2017   MCV 89 06/14/2017   PLT 234 06/14/2017    Lab Results  Component  Value Date   NA 145 (H) 06/14/2017   K 4.6 06/14/2017   CL 101 06/14/2017   CO2 21 06/14/2017   BUN 13 06/14/2017   CREATININE 0.82 06/14/2017   GLUCOSE 124 (H) 06/14/2017   ALT 18 06/14/2017    Lab Results  Component Value Date   CHOL 154 06/14/2017   HDL 34 (L) 06/14/2017   LDLCALC 85 06/14/2017   TRIG 175 (H) 06/14/2017   CHOLHDL 4.5 06/14/2017     --------------------------------------------------------------------------------------------------  ASSESSMENT AND PLAN: Coronary artery disease without angina Mr. Lindaann SloughGulin reports an extensive history of coronary disease with up to 14 stents.  He has not had any symptoms for over a year.  He is on good medical therapy, including aspirin, clopidogrel, atorvastatin, and metoprolol.  I will request his records from his prior cardiologist in [place].  I encouraged Mr. Lindaann SloughGulin to remain active and to stop smoking.  Chronic systolic  heart failure secondary to ischemic cardiomyopathy Echo report from 2017 indicates moderate to severe LV dysfunction with an EF of 30-35%.  Mr. Lindaann SloughGulin appears euvolemic and well compensated on exam today with NYHA class I-II symptoms.  I will attempt to obtain further records from his prior cardiologist.  It may be worthwhile to repeat an echo to see if his EF has improved at all.  We will continue his current doses of metoprolol and lisinopril.  I am hesitant to escalate metoprolol given his low normal resting heart rate. We could consider increasing lisinopril in the future.  Hyperlipidemia Goal LDL is less than 70.  Labs in 05/2017 showed an LDL of 85.  We have agreed to increase atorvastatin from 40 mg daily to 80 mg daily.  We will plan to recheck a lipid panel and ALT in about 3 months.  Tobacco use Smoking cessation was encouraged.  Follow-up: Return to clinic in 6 months.  Yvonne Kendallhristopher Amparo Donalson, MD 10/04/2017 9:40 AM

## 2017-10-04 ENCOUNTER — Encounter: Payer: Self-pay | Admitting: Internal Medicine

## 2017-10-04 DIAGNOSIS — E7849 Other hyperlipidemia: Secondary | ICD-10-CM | POA: Insufficient documentation

## 2017-10-04 DIAGNOSIS — E782 Mixed hyperlipidemia: Secondary | ICD-10-CM | POA: Insufficient documentation

## 2017-10-04 DIAGNOSIS — I255 Ischemic cardiomyopathy: Secondary | ICD-10-CM | POA: Insufficient documentation

## 2017-10-04 DIAGNOSIS — E1169 Type 2 diabetes mellitus with other specified complication: Secondary | ICD-10-CM | POA: Insufficient documentation

## 2017-10-10 ENCOUNTER — Telehealth: Payer: Self-pay | Admitting: *Deleted

## 2017-10-10 DIAGNOSIS — I255 Ischemic cardiomyopathy: Secondary | ICD-10-CM

## 2017-10-10 NOTE — Telephone Encounter (Signed)
-----   Message from Yvonne Kendallhristopher End, MD sent at 10/10/2017  7:50 AM EST ----- Regarding: Outside records Please let Mr. Lindaann SloughGulin know that I have reviewed his outside records and would like to schedule him for an echocardiogram to reassess his LV function (history of ischemic cardiomyopathy and LVEF of 30-35% in 2017). This can be done at Mr. Delton SeeGulin's convenience before his next f/u appointment. Thanks.  Thayer Ohmhris

## 2017-10-10 NOTE — Telephone Encounter (Signed)
S/w patient. He verbalized understanding of plan of care for echo. Order for echo entered and [facility]

## 2017-10-23 ENCOUNTER — Other Ambulatory Visit: Payer: Medicare Other

## 2017-11-07 DIAGNOSIS — M25569 Pain in unspecified knee: Secondary | ICD-10-CM | POA: Diagnosis not present

## 2017-11-07 DIAGNOSIS — Z79891 Long term (current) use of opiate analgesic: Secondary | ICD-10-CM | POA: Diagnosis not present

## 2017-11-07 DIAGNOSIS — Z79899 Other long term (current) drug therapy: Secondary | ICD-10-CM | POA: Diagnosis not present

## 2017-11-07 DIAGNOSIS — M1710 Unilateral primary osteoarthritis, unspecified knee: Secondary | ICD-10-CM | POA: Diagnosis not present

## 2017-11-07 DIAGNOSIS — G894 Chronic pain syndrome: Secondary | ICD-10-CM | POA: Diagnosis not present

## 2017-11-08 ENCOUNTER — Other Ambulatory Visit: Payer: Medicare HMO

## 2017-11-18 ENCOUNTER — Other Ambulatory Visit: Payer: Self-pay

## 2017-11-18 ENCOUNTER — Ambulatory Visit (INDEPENDENT_AMBULATORY_CARE_PROVIDER_SITE_OTHER): Payer: Medicare HMO

## 2017-11-18 DIAGNOSIS — I255 Ischemic cardiomyopathy: Secondary | ICD-10-CM

## 2017-11-18 LAB — ECHOCARDIOGRAM COMPLETE
CHL CUP MV DEC (S): 208
CHL CUP STROKE VOLUME: 41 mL
E/e' ratio: 5.83
EWDT: 208 ms
FS: 26 % — AB (ref 28–44)
IV/PV OW: 0.82
LA ID, A-P, ES: 37 mm
LA diam index: 1.8 cm/m2
LAVOLA4C: 29.2 mL
LDCA: 3.14 cm2
LEFT ATRIUM END SYS DIAM: 37 mm
LV E/e'average: 5.83
LV PW d: 11 mm — AB (ref 0.6–1.1)
LV dias vol index: 35 mL/m2
LV sys vol: 31 mL (ref 21–61)
LVDIAVOL: 73 mL (ref 62–150)
LVEEMED: 5.83
LVELAT: 13.3 cm/s
LVOT VTI: 18.9 cm
LVOT diameter: 20 mm
LVOTSV: 59 mL
LVSYSVOLIN: 15 mL/m2
Lateral S' vel: 14.6 cm/s
MV Peak grad: 2 mmHg
MV pk A vel: 76.2 m/s
MV pk E vel: 77.6 m/s
Simpson's disk: 57
TAPSE: 26 mm
TDI e' lateral: 13.3
TDI e' medial: 9.03

## 2017-11-19 ENCOUNTER — Telehealth: Payer: Self-pay | Admitting: Internal Medicine

## 2017-11-19 NOTE — Telephone Encounter (Signed)
Patient returning call.

## 2017-11-20 NOTE — Telephone Encounter (Signed)
Results of recent echo called to patient and verbalized understanding.

## 2017-12-16 ENCOUNTER — Ambulatory Visit: Payer: Medicare HMO | Admitting: Family Medicine

## 2017-12-16 ENCOUNTER — Encounter: Payer: Self-pay | Admitting: Family Medicine

## 2017-12-16 VITALS — BP 128/78 | HR 75 | Temp 98.0°F | Wt 186.2 lb

## 2017-12-16 DIAGNOSIS — M7552 Bursitis of left shoulder: Secondary | ICD-10-CM

## 2017-12-16 MED ORDER — PREDNISONE 10 MG PO TABS: ORAL_TABLET | ORAL | 0 refills | Status: DC

## 2017-12-16 NOTE — Patient Instructions (Signed)
If not better in 10-14 days, consider making an appointment with Dr. Patsy Lageropland- sports medicine for possible injection

## 2017-12-16 NOTE — Progress Notes (Signed)
Subjective:    Patient ID: John Mathis, male    DOB: 1961/01/08, 57 y.o.   MRN: [id number]  HPI This is a 57 yo male who presents today with left shoulder pain. Started about 3 weeks ago, feels like a hot knife going through it, constant pain. Right handed. Has tried heat/ice. Does not recall a specific injury, but has lifted his wife several times recently when she has fallen. No falls.  He continues to see pain management for his chronic knee pain, is currently on levophanol, off oxycontin. Taking Levorphanol sparingly.  Increased stress at home with wife's recent frequent hospitalizations, she was readmitted today following a fall and hip dislocation.   Past Medical History:  Diagnosis Date  . Arthritis   . CAD (coronary artery disease)   . CHF (congestive heart failure) (HCC)   . Diabetes mellitus without complication (HCC)   . Hyperlipidemia   . Hypertension   . IHD (ischemic heart disease)   . Lyme disease   . Myocardial infarction [facility])    Past Surgical History:  Procedure Laterality Date  . CARDIAC CATHETERIZATION  2012   x4 stents  . CARDIAC CATHETERIZATION  2014   x6 stents  . CARDIAC CATHETERIZATION  2014   x2 stents  . HERNIA REPAIR    . JOINT REPLACEMENT    . KNEE SURGERY Right   . SMALL INTESTINE SURGERY     Family History  Problem Relation Age of Onset  . Hyperlipidemia Mother   . Other Father        parasite infection  . Mental illness Maternal Grandmother    Social History   Tobacco Use  . Smoking status: Current Every Day Smoker    Packs/day: 0.50    Years: 30.00    Pack years: 15.00    Types: Cigarettes  . Smokeless tobacco: Never Used  Substance Use Topics  . Alcohol use: No  . Drug use: No      Review of Systems Per HPI    Objective:   Physical Exam  Constitutional: He is oriented to person, place, and time. He appears well-developed and well-nourished. No distress.  HENT:  Head: Normocephalic and atraumatic.  Eyes:  Conjunctivae are normal.  Cardiovascular: Normal rate.  Pulmonary/Chest: Effort normal.  Musculoskeletal:       Left shoulder: He exhibits tenderness (anterior) and crepitus. He exhibits normal range of motion and normal strength.  Pain with abduction and supination.   Neurological: He is alert and oriented to person, place, and time.  Skin: Skin is warm and dry. He is not diaphoretic.  Psychiatric: He has a normal mood and affect. His behavior is normal. Judgment and thought content normal.  Vitals reviewed.     BP 128/78   Pulse 75   Temp 98 F (36.7 C) (Oral)   Wt 186 lb 4 oz (84.5 kg)   SpO2 98%   BMI 25.98 kg/m  Wt Readings from Last 3 Encounters:  12/16/17 186 lb 4 oz (84.5 kg)  10/02/17 187 lb (84.8 kg)  08/09/17 186 lb 8 oz (84.6 kg)       Assessment & Plan:  1. Acute bursitis of left shoulder - on plavix for CAD so unable to take NSAIDs, will give course of prednisone - Provided written and verbal information regarding diagnosis and treatment. - potential side effects of prednisone reviewed - predniSONE (DELTASONE) 10 MG tablet; Take 6 x 1 day, 5x1 day, 4x1 day, 3 x 1 day, 2 x  1 day, 1 x 1 day  Dispense: 21 tablet; Refill: 0 - follow up suggested with Dr. Patsy Lageropland if not improved with prednisone for possible injection  Olean Reeeborah Sentoria Brent, FNP-BC  [facility], [facility]  12/16/2017 11:03 AM

## 2017-12-26 ENCOUNTER — Ambulatory Visit (INDEPENDENT_AMBULATORY_CARE_PROVIDER_SITE_OTHER)
Admission: RE | Admit: 2017-12-26 | Discharge: 2017-12-26 | Disposition: A | Discharge status: Home / Self Care | Payer: Medicare HMO | Source: Ambulatory Visit | Attending: Internal Medicine | Admitting: Internal Medicine

## 2017-12-26 ENCOUNTER — Ambulatory Visit: Payer: Medicare HMO | Admitting: Internal Medicine

## 2017-12-26 ENCOUNTER — Encounter: Payer: Self-pay | Admitting: Internal Medicine

## 2017-12-26 VITALS — BP 120/68 | HR 74 | Temp 97.8°F | Wt 186.0 lb

## 2017-12-26 DIAGNOSIS — M25512 Pain in left shoulder: Secondary | ICD-10-CM

## 2017-12-26 NOTE — Progress Notes (Signed)
Subjective:    Patient ID: John Mathis, male    DOB: [birth year], 57 y.o.   MRN: [id number]  HPI  Pt presents to the clinic today with c/o left shoulder pain. This started 1 month ago. He describes the pain as sharp and stabbing. The pain radiates deep into his shoulder. He denies numbness, tingling or weakness. He has pain with range of motion. He denies any injury to the area. He was seen 12/16/17 by his PCP for the same. He was diagnosed with bursitis and treated with a Prednisone Taper.  Review of Systems      Past Medical History:  Diagnosis Date  . Arthritis   . CAD (coronary artery disease)   . CHF (congestive heart failure) (HCC)   . Diabetes mellitus without complication (HCC)   . Hyperlipidemia   . Hypertension   . IHD (ischemic heart disease)   . Lyme disease   . Myocardial infarction [facility](HCC)     Current Outpatient Medications  Medication Sig Dispense Refill  . ALPRAZolam (XANAX) 1 MG tablet TAKE 1 TABLET BY MOUTH 3 TIMES A DAY AS NEEDED FOR ANXIETY  5  . aspirin EC 81 MG tablet Take 81 mg by mouth daily.    Marland Kitchen. atorvastatin (LIPITOR) 80 MG tablet Take 1 tablet (80 mg total) by mouth daily. 90 tablet 3  . clopidogrel (PLAVIX) 75 MG tablet Take 1 tablet (75 mg total) by mouth daily. 90 tablet 3  . levorphanol (LEVODROMORAN) 2 MG tablet TAKE 1.5 TABLETS BY MOUTH THREE TIMES DAILY.  0  . lisinopril (PRINIVIL,ZESTRIL) 5 MG tablet Take 1 tablet (5 mg total) by mouth daily. 90 tablet 3  . metFORMIN (GLUCOPHAGE) 500 MG tablet Take by mouth 2 (two) times daily with a meal.    . metoprolol succinate (TOPROL-XL) 25 MG 24 hr tablet Take 1 tablet (25 mg total) by mouth daily. 90 tablet 3  . zolpidem (AMBIEN) 5 MG tablet Take 5 mg by mouth at bedtime as needed. for sleep  0   No current facility-administered medications for this visit.     Allergies  Allergen Reactions  . Ivp Dye [Iodinated Diagnostic Agents] Hives    Family History  Problem Relation Age of Onset  .  Hyperlipidemia Mother   . Other Father        parasite infection  . Mental illness Maternal Grandmother     Social History   Socioeconomic History  . Marital status: Married    Spouse name: Not on file  . Number of children: Not on file  . Years of education: Not on file  . Highest education level: Not on file  Social Needs  . Financial resource strain: Not on file  . Food insecurity - worry: Not on file  . Food insecurity - inability: Not on file  . Transportation needs - medical: Not on file  . Transportation needs - non-medical: Not on file  Occupational History  . Not on file  Tobacco Use  . Smoking status: Current Every Day Smoker    Packs/day: 0.50    Years: 30.00    Pack years: 15.00    Types: Cigarettes  . Smokeless tobacco: Never Used  Substance and Sexual Activity  . Alcohol use: No  . Drug use: No  . Sexual activity: No  Other Topics Concern  . Not on file  Social History Narrative  . Not on file     Constitutional: Denies fever, malaise, fatigue, headache or abrupt weight changes.  Musculoskeletal: Pt reports left shoulder pain, decrease in ROM. Denies difficulty with gait, muscle pain or joint swelling.  Neurological: Denies dizziness, difficulty with memory, difficulty with speech or problems with balance and coordination.    No other specific complaints in a complete review of systems (except as listed in HPI above).  Objective:   Physical Exam   BP 120/68   Pulse 74   Temp 97.8 F (36.6 C) (Oral)   Wt 186 lb (84.4 kg)   SpO2 97%   BMI 25.94 kg/m  Wt Readings from Last 3 Encounters:  12/26/17 186 lb (84.4 kg)  12/16/17 186 lb 4 oz (84.5 kg)  10/02/17 187 lb (84.8 kg)    General: Appears his stated age, well developed, well nourished in NAD. Musculoskeletal: Decreased external rotation of the left shoulder. Normal internal rotation. Pain with palpation over the left anterior biceps tendon. Negative drop can test. Strength 5/5  BUE. Neurological: Sensation intact to BUE.  BMET    Component Value Date/Time   NA 145 (H) 06/14/2017 1123   K 4.6 06/14/2017 1123   CL 101 06/14/2017 1123   CO2 21 06/14/2017 1123   GLUCOSE 124 (H) 06/14/2017 1123   BUN 13 06/14/2017 1123   CREATININE 0.82 06/14/2017 1123   CALCIUM 10.4 (H) 06/14/2017 1123   GFRNONAA 100 06/14/2017 1123   GFRAA 115 06/14/2017 1123    Lipid Panel     Component Value Date/Time   CHOL 154 06/14/2017 1123   TRIG 175 (H) 06/14/2017 1123   HDL 34 (L) 06/14/2017 1123   CHOLHDL 4.5 06/14/2017 1123   LDLCALC 85 06/14/2017 1123    CBC    Component Value Date/Time   WBC 6.8 06/14/2017 1123   RBC 5.49 06/14/2017 1123   HGB 17.0 06/14/2017 1123   HCT 49.0 06/14/2017 1123   PLT 234 06/14/2017 1123   MCV 89 06/14/2017 1123   MCH 31.0 06/14/2017 1123   MCHC 34.7 06/14/2017 1123   RDW 13.4 06/14/2017 1123   LYMPHSABS 2.0 06/14/2017 1123   EOSABS 0.1 06/14/2017 1123   BASOSABS 0.0 06/14/2017 1123    Hgb A1C Lab Results  Component Value Date   HGBA1C 6.2 (H) 06/14/2017           Assessment & Plan:   Left Shoulder Pain:  No improvement with Prednisone Will obtain xray of left shoulder today Advised Tylenol and Ibuprofen prn Ice may be helpful  Shoulder exercises given  Will follow up after xray, return precautions discussed Nicki Reaper, NP

## 2017-12-26 NOTE — Patient Instructions (Signed)
Shoulder Exercises Ask your health care provider which exercises are safe for you. Do exercises exactly as told by your health care provider and adjust them as directed. It is normal to feel mild stretching, pulling, tightness, or discomfort as you do these exercises, but you should stop right away if you feel sudden pain or your pain gets worse.Do not begin these exercises until told by your health care provider. RANGE OF MOTION EXERCISES These exercises warm up your muscles and joints and improve the movement and flexibility of your shoulder. These exercises also help to relieve pain, numbness, and tingling. These exercises involve stretching your injured shoulder directly. Exercise A: Pendulum  1. Stand near a wall or a surface that you can hold onto for balance. 2. Bend at the waist and let your left / right arm hang straight down. Use your other arm to support you. Keep your back straight and do not lock your knees. 3. Relax your left / right arm and shoulder muscles, and move your hips and your trunk so your left / right arm swings freely. Your arm should swing because of the motion of your body, not because you are using your arm or shoulder muscles. 4. Keep moving your body so your arm swings in the following directions, as told by your health care provider: ? Side to side. ? Forward and backward. ? In clockwise and counterclockwise circles. 5. Continue each motion for __________ seconds, or for as long as told by your health care provider. 6. Slowly return to the starting position. Repeat __________ times. Complete this exercise __________ times a day. Exercise B:Flexion, Standing  1. Stand and hold a broomstick, a cane, or a similar object. Place your hands a little more than shoulder-width apart on the object. Your left / right hand should be palm-up, and your other hand should be palm-down. 2. Keep your elbow straight and keep your shoulder muscles relaxed. Push the stick down with  your healthy arm to raise your left / right arm in front of your body, and then over your head until you feel a stretch in your shoulder. ? Avoid shrugging your shoulder while you raise your arm. Keep your shoulder blade tucked down toward the middle of your back. 3. Hold for __________ seconds. 4. Slowly return to the starting position. Repeat __________ times. Complete this exercise __________ times a day. Exercise C: Abduction, Standing 1. Stand and hold a broomstick, a cane, or a similar object. Place your hands a little more than shoulder-width apart on the object. Your left / right hand should be palm-up, and your other hand should be palm-down. 2. While keeping your elbow straight and your shoulder muscles relaxed, push the stick across your body toward your left / right side. Raise your left / right arm to the side of your body and then over your head until you feel a stretch in your shoulder. ? Do not raise your arm above shoulder height, unless your health care provider tells you to do that. ? Avoid shrugging your shoulder while you raise your arm. Keep your shoulder blade tucked down toward the middle of your back. 3. Hold for __________ seconds. 4. Slowly return to the starting position. Repeat __________ times. Complete this exercise __________ times a day. Exercise D:Internal Rotation  1. Place your left / right hand behind your back, palm-up. 2. Use your other hand to dangle an exercise band, a towel, or a similar object over your shoulder. Grasp the band with   your left / right hand so you are holding onto both ends. 3. Gently pull up on the band until you feel a stretch in the front of your left / right shoulder. ? Avoid shrugging your shoulder while you raise your arm. Keep your shoulder blade tucked down toward the middle of your back. 4. Hold for __________ seconds. 5. Release the stretch by letting go of the band and lowering your hands. Repeat __________ times. Complete  this exercise __________ times a day. STRETCHING EXERCISES These exercises warm up your muscles and joints and improve the movement and flexibility of your shoulder. These exercises also help to relieve pain, numbness, and tingling. These exercises are done using your healthy shoulder to help stretch the muscles of your injured shoulder. Exercise E: Corner Stretch (External Rotation and Abduction)  1. Stand in a doorway with one of your feet slightly in front of the other. This is called a staggered stance. If you cannot reach your forearms to the door frame, stand facing a corner of a room. 2. Choose one of the following positions as told by your health care provider: ? Place your hands and forearms on the door frame above your head. ? Place your hands and forearms on the door frame at the height of your head. ? Place your hands on the door frame at the height of your elbows. 3. Slowly move your weight onto your front foot until you feel a stretch across your chest and in the front of your shoulders. Keep your head and chest upright and keep your abdominal muscles tight. 4. Hold for __________ seconds. 5. To release the stretch, shift your weight to your back foot. Repeat __________ times. Complete this stretch __________ times a day. Exercise F:Extension, Standing 1. Stand and hold a broomstick, a cane, or a similar object behind your back. ? Your hands should be a little wider than shoulder-width apart. ? Your palms should face away from your back. 2. Keeping your elbows straight and keeping your shoulder muscles relaxed, move the stick away from your body until you feel a stretch in your shoulder. ? Avoid shrugging your shoulders while you move the stick. Keep your shoulder blade tucked down toward the middle of your back. 3. Hold for __________ seconds. 4. Slowly return to the starting position. Repeat __________ times. Complete this exercise __________ times a day. STRENGTHENING  EXERCISES These exercises build strength and endurance in your shoulder. Endurance is the ability to use your muscles for a long time, even after they get tired. Exercise G:External Rotation  1. Sit in a stable chair without armrests. 2. Secure an exercise band at elbow height on your left / right side. 3. Place a soft object, such as a folded towel or a small pillow, between your left / right upper arm and your body to move your elbow a few inches away (about 10 cm) from your side. 4. Hold the end of the band so it is tight and there is no slack. 5. Keeping your elbow pressed against the soft object, move your left / right forearm out, away from your abdomen. Keep your body steady so only your forearm moves. 6. Hold for __________ seconds. 7. Slowly return to the starting position. Repeat __________ times. Complete this exercise __________ times a day. Exercise H:Shoulder Abduction  1. Sit in a stable chair without armrests, or stand. 2. Hold a __________ weight in your left / right hand, or hold an exercise band with both hands.   3. Start with your arms straight down and your left / right palm facing in, toward your body. 4. Slowly lift your left / right hand out to your side. Do not lift your hand above shoulder height unless your health care provider tells you that this is safe. ? Keep your arms straight. ? Avoid shrugging your shoulder while you do this movement. Keep your shoulder blade tucked down toward the middle of your back. 5. Hold for __________ seconds. 6. Slowly lower your arm, and return to the starting position. Repeat __________ times. Complete this exercise __________ times a day. Exercise I:Shoulder Extension 1. Sit in a stable chair without armrests, or stand. 2. Secure an exercise band to a stable object in front of you where it is at shoulder height. 3. Hold one end of the exercise band in each hand. Your palms should face each other. 4. Straighten your elbows and  lift your hands up to shoulder height. 5. Step back, away from the secured end of the exercise band, until the band is tight and there is no slack. 6. Squeeze your shoulder blades together as you pull your hands down to the sides of your thighs. Stop when your hands are straight down by your sides. Do not let your hands go behind your body. 7. Hold for __________ seconds. 8. Slowly return to the starting position. Repeat __________ times. Complete this exercise __________ times a day. Exercise J:Standing Shoulder Row 1. Sit in a stable chair without armrests, or stand. 2. Secure an exercise band to a stable object in front of you so it is at waist height. 3. Hold one end of the exercise band in each hand. Your palms should be in a thumbs-up position. 4. Bend each of your elbows to an "L" shape (about 90 degrees) and keep your upper arms at your sides. 5. Step back until the band is tight and there is no slack. 6. Slowly pull your elbows back behind you. 7. Hold for __________ seconds. 8. Slowly return to the starting position. Repeat __________ times. Complete this exercise __________ times a day. Exercise K:Shoulder Press-Ups  1. Sit in a stable chair that has armrests. Sit upright, with your feet flat on the floor. 2. Put your hands on the armrests so your elbows are bent and your fingers are pointing forward. Your hands should be about even with the sides of your body. 3. Push down on the armrests and use your arms to lift yourself off of the chair. Straighten your elbows and lift yourself up as much as you comfortably can. ? Move your shoulder blades down, and avoid letting your shoulders move up toward your ears. ? Keep your feet on the ground. As you get stronger, your feet should support less of your body weight as you lift yourself up. 4. Hold for __________ seconds. 5. Slowly lower yourself back into the chair. Repeat __________ times. Complete this exercise __________ times a  day. Exercise L: Wall Push-Ups  1. Stand so you are facing a stable wall. Your feet should be about one arm-length away from the wall. 2. Lean forward and place your palms on the wall at shoulder height. 3. Keep your feet flat on the floor as you bend your elbows and lean forward toward the wall. 4. Hold for __________ seconds. 5. Straighten your elbows to push yourself back to the starting position. Repeat __________ times. Complete this exercise __________ times a day. This information is not intended to replace advice   given to you by your health care provider. Make sure you discuss any questions you have with your health care provider. Document Released: 08/29/2005 Document Revised: 07/09/2016 Document Reviewed: 06/26/2015 Elsevier Interactive Patient Education  2018 [employer] Inc[employer]  

## 2017-12-30 ENCOUNTER — Other Ambulatory Visit: Payer: Self-pay | Admitting: Internal Medicine

## 2017-12-30 ENCOUNTER — Telehealth: Payer: Self-pay

## 2017-12-30 DIAGNOSIS — M25512 Pain in left shoulder: Secondary | ICD-10-CM

## 2017-12-30 NOTE — Telephone Encounter (Signed)
Patient asked for a call back from provider. He states this is in regards to "bad flare up of colitis he is having he thinks". He has had this in the past and wants to know what he can take over the counter. Patient advised that the nurse will call back first probably but I would send a message. -AH

## 2017-12-30 NOTE — Telephone Encounter (Signed)
Called and spoke with patient who reports intermittent LLQ pain which is dull. Has been having some constipation, had small bowel movement today. Noticed small amount bright red blood. No fever or chills. He was advised to take Miralax, follow up if no improvement in 24-48 hours or sooner if worsening symptoms.

## 2018-01-01 DIAGNOSIS — M7542 Impingement syndrome of left shoulder: Secondary | ICD-10-CM | POA: Diagnosis not present

## 2018-01-01 DIAGNOSIS — M7540 Impingement syndrome of unspecified shoulder: Secondary | ICD-10-CM | POA: Insufficient documentation

## 2018-01-08 ENCOUNTER — Ambulatory Visit: Payer: Medicare Other | Admitting: Internal Medicine

## 2018-01-13 DIAGNOSIS — R609 Edema, unspecified: Secondary | ICD-10-CM | POA: Diagnosis not present

## 2018-01-13 DIAGNOSIS — M25612 Stiffness of left shoulder, not elsewhere classified: Secondary | ICD-10-CM | POA: Diagnosis not present

## 2018-01-13 DIAGNOSIS — M25512 Pain in left shoulder: Secondary | ICD-10-CM | POA: Diagnosis not present

## 2018-01-20 ENCOUNTER — Other Ambulatory Visit: Payer: Self-pay | Admitting: Family Medicine

## 2018-01-20 DIAGNOSIS — M25612 Stiffness of left shoulder, not elsewhere classified: Secondary | ICD-10-CM | POA: Diagnosis not present

## 2018-01-20 DIAGNOSIS — M25512 Pain in left shoulder: Secondary | ICD-10-CM | POA: Diagnosis not present

## 2018-01-20 DIAGNOSIS — R609 Edema, unspecified: Secondary | ICD-10-CM | POA: Diagnosis not present

## 2018-01-22 DIAGNOSIS — R609 Edema, unspecified: Secondary | ICD-10-CM | POA: Diagnosis not present

## 2018-01-22 DIAGNOSIS — M25612 Stiffness of left shoulder, not elsewhere classified: Secondary | ICD-10-CM | POA: Diagnosis not present

## 2018-01-22 DIAGNOSIS — M25512 Pain in left shoulder: Secondary | ICD-10-CM | POA: Diagnosis not present

## 2018-01-29 DIAGNOSIS — M25612 Stiffness of left shoulder, not elsewhere classified: Secondary | ICD-10-CM | POA: Diagnosis not present

## 2018-01-29 DIAGNOSIS — M25512 Pain in left shoulder: Secondary | ICD-10-CM | POA: Diagnosis not present

## 2018-01-29 DIAGNOSIS — R609 Edema, unspecified: Secondary | ICD-10-CM | POA: Diagnosis not present

## 2018-01-31 ENCOUNTER — Other Ambulatory Visit: Payer: Self-pay | Admitting: Family Medicine

## 2018-01-31 DIAGNOSIS — E119 Type 2 diabetes mellitus without complications: Secondary | ICD-10-CM

## 2018-01-31 MED ORDER — METFORMIN HCL 500 MG PO TABS: 500.0000 mg | ORAL_TABLET | Freq: Two times a day (BID) | ORAL | 3 refills | Status: DC

## 2018-01-31 NOTE — Progress Notes (Signed)
Patient was in office with his wife and requested refill of metformin.

## 2018-02-03 DIAGNOSIS — M25512 Pain in left shoulder: Secondary | ICD-10-CM | POA: Diagnosis not present

## 2018-02-03 DIAGNOSIS — R609 Edema, unspecified: Secondary | ICD-10-CM | POA: Diagnosis not present

## 2018-02-03 DIAGNOSIS — M25612 Stiffness of left shoulder, not elsewhere classified: Secondary | ICD-10-CM | POA: Diagnosis not present

## 2018-02-04 ENCOUNTER — Other Ambulatory Visit: Payer: Self-pay | Admitting: Family Medicine

## 2018-02-04 NOTE — Telephone Encounter (Signed)
Copied from CRM [id number]. Topic: Quick Communication - Rx Refill/Question >> Feb 04, 2018 11:29 AM Cipriano BunkerLambe, Annette S wrote:  Medication: zolpidem (AMBIEN) 5 MG tablet He has one pill left  Has the patient contacted their pharmacy? No.  (Agent: If no, request that the patient contact the pharmacy for the refill.) Preferred Pharmacy (with phone number or street name):  [id number] [place] [place] - [address] [place] [id number] Phone: [phone] Fax: [phone]  Has been told to call to call pharmacy at least 5 days early for next prescriptions.   Agent: Please be advised that RX refills may take up to 3 business days. We ask that you follow-up with your pharmacy.

## 2018-02-04 NOTE — Telephone Encounter (Signed)
Pt is out of medication

## 2018-02-04 NOTE — Telephone Encounter (Signed)
Wife called, she is asking for a call about this medication, pt it out.  She wants to know what can be done to get this filled today.

## 2018-02-04 NOTE — Telephone Encounter (Signed)
LOV  12/26/17 [facility] [address]

## 2018-02-05 NOTE — Telephone Encounter (Signed)
I have never [place]. Please call the patient and tell him that he will have to get this from his Pain Management provider because it is a controlled substance.

## 2018-02-05 NOTE — Telephone Encounter (Signed)
[facility] states he will contact pain mgmt

## 2018-02-05 NOTE — Telephone Encounter (Signed)
[facility] [place];  Pt established care 08/09/17; in the office note last received ambien # 30 on 06/10/17.  Last acute visit 12/26/17. Do not see where Harlin Heys [facility] FNP has filled ambien.Please advise. Pt is out of medication.

## 2018-02-06 DIAGNOSIS — G894 Chronic pain syndrome: Secondary | ICD-10-CM | POA: Diagnosis not present

## 2018-02-06 DIAGNOSIS — M1710 Unilateral primary osteoarthritis, unspecified knee: Secondary | ICD-10-CM | POA: Diagnosis not present

## 2018-02-06 DIAGNOSIS — Z79891 Long term (current) use of opiate analgesic: Secondary | ICD-10-CM | POA: Diagnosis not present

## 2018-02-06 DIAGNOSIS — M25569 Pain in unspecified knee: Secondary | ICD-10-CM | POA: Diagnosis not present

## 2018-02-06 DIAGNOSIS — Z79899 Other long term (current) drug therapy: Secondary | ICD-10-CM | POA: Diagnosis not present

## 2018-02-06 DIAGNOSIS — M19019 Primary osteoarthritis, unspecified shoulder: Secondary | ICD-10-CM | POA: Diagnosis not present

## 2018-02-10 DIAGNOSIS — M25612 Stiffness of left shoulder, not elsewhere classified: Secondary | ICD-10-CM | POA: Diagnosis not present

## 2018-02-10 DIAGNOSIS — R609 Edema, unspecified: Secondary | ICD-10-CM | POA: Diagnosis not present

## 2018-02-10 DIAGNOSIS — M25512 Pain in left shoulder: Secondary | ICD-10-CM | POA: Diagnosis not present

## 2018-02-12 DIAGNOSIS — M7542 Impingement syndrome of left shoulder: Secondary | ICD-10-CM | POA: Diagnosis not present

## 2018-02-17 ENCOUNTER — Ambulatory Visit: Payer: Medicare HMO | Admitting: Primary Care

## 2018-02-17 ENCOUNTER — Encounter: Payer: Self-pay | Admitting: Primary Care

## 2018-02-17 VITALS — BP 114/72 | HR 78 | Temp 98.1°F | Ht 71.0 in | Wt 184.0 lb

## 2018-02-17 DIAGNOSIS — R1012 Left upper quadrant pain: Secondary | ICD-10-CM

## 2018-02-17 LAB — CBC WITH DIFFERENTIAL/PLATELET
BASOS PCT: 0.3 % (ref 0.0–3.0)
Basophils Absolute: 0 10*3/uL (ref 0.0–0.1)
Eosinophils Absolute: 0.1 10*3/uL (ref 0.0–0.7)
Eosinophils Relative: 1.6 % (ref 0.0–5.0)
HCT: 46 % (ref 39.0–52.0)
Hemoglobin: 16.3 g/dL (ref 13.0–17.0)
LYMPHS ABS: 1.4 10*3/uL (ref 0.7–4.0)
Lymphocytes Relative: 22.2 % (ref 12.0–46.0)
MCHC: 35.5 g/dL (ref 30.0–36.0)
MCV: 89.3 fl (ref 78.0–100.0)
MONO ABS: 0.3 10*3/uL (ref 0.1–1.0)
Monocytes Relative: 5.2 % (ref 3.0–12.0)
NEUTROS ABS: 4.3 10*3/uL (ref 1.4–7.7)
NEUTROS PCT: 70.7 % (ref 43.0–77.0)
PLATELETS: 239 10*3/uL (ref 150.0–400.0)
RBC: 5.15 Mil/uL (ref 4.22–5.81)
RDW: 13.3 % (ref 11.5–15.5)
WBC: 6.1 10*3/uL (ref 4.0–10.5)

## 2018-02-17 LAB — COMPREHENSIVE METABOLIC PANEL
ALT: 17 U/L (ref 0–53)
AST: 14 U/L (ref 0–37)
Albumin: 4.5 g/dL (ref 3.5–5.2)
Alkaline Phosphatase: 111 U/L (ref 39–117)
BUN: 13 mg/dL (ref 6–23)
CALCIUM: 9.9 mg/dL (ref 8.4–10.5)
CHLORIDE: 102 meq/L (ref 96–112)
CO2: 29 meq/L (ref 19–32)
CREATININE: 0.83 mg/dL (ref 0.40–1.50)
GFR: 101.7 mL/min (ref >60.00)
GLUCOSE: 248 mg/dL — AB (ref 70–99)
Potassium: 4.2 mEq/L (ref 3.5–5.1)
SODIUM: 140 meq/L (ref 135–145)
Total Bilirubin: 0.8 mg/dL (ref 0.2–1.2)
Total Protein: 7.3 g/dL (ref 6.0–8.3)

## 2018-02-17 MED ORDER — CIPROFLOXACIN HCL 500 MG PO TABS: 500.0000 mg | ORAL_TABLET | Freq: Two times a day (BID) | ORAL | 0 refills | Status: DC

## 2018-02-17 MED ORDER — METRONIDAZOLE 500 MG PO TABS: 500.0000 mg | ORAL_TABLET | Freq: Three times a day (TID) | ORAL | 0 refills | Status: DC

## 2018-02-17 NOTE — Patient Instructions (Signed)
Stop by the lab prior to leaving today. I will notify you of your results once received.   Start ciprofloxacin antibiotics for the infection. Take 1 tablet by mouth twice daily for 10 days.  Start metronidazole antibiotics for the infection. Take 1 tablet by mouth three times daily for 10 days.  Work on a clear liquid diet to prevent further stomach upset.   Please go to the hospital if you develop fevers, notice increased pain, or do not feel improved after 3-4 days on antibiotics.  It was a pleasure meeting you!   Clear Liquid Diet A clear liquid diet means that you only have liquids that you can see through. You do not eat any food on this diet. Most people need to follow this diet for only a short time. What do I need to know about this diet?  A clear liquid is a liquid that you can see through when you hold it up to a light.  This diet does not give you all the nutrients that you need. Choose a variety of the liquids that your doctor says you can drink on this diet. That way, you will get as many nutrients as possible.  If you are not sure whether you can have certain items, ask your doctor. What can I have?  Water and flavored water.  Fruit juices that do not have pulp, such as cranberry juice and apple juice.  Tea and coffee without milk or cream.  Clear bouillon or broth.  Broth-based soups that have been strained.  Flavored gelatins.  Honey.  Sugar water.  Frozen ice or frozen ice pops that do not have any milk, yogurt, fruit pieces, or fruit pulp in them.  Clear sodas.  Clear sports drinks. The items listed above may not be a complete list of recommended liquids. Contact your food and nutrition expert (dietitian) for more options. What can I not have?  Juices that have pulp.  Milk.  Cream or cream-based soups.  Yogurt. The items listed above may not be a complete list of liquids to avoid. Contact your food and nutrition expert for more  information. Summary  A clear liquid diet is a diet that includes only liquids that you can see through.  The goal of this diet is to help you recover.  Make sure to avoid liquids with milk, cream, or pulp while you are on this diet. This information is not intended to replace advice given to you by your health care provider. Make sure you discuss any questions you have with your health care provider. Document Released: 09/27/2008 Document Revised: 05/28/2016 Document Reviewed: 09/11/2013 Elsevier Interactive Patient Education  2017 [employer]

## 2018-02-17 NOTE — Progress Notes (Signed)
Subjective:    Patient ID: John Mathis, male    DOB: January 22, 1961, 57 y.o.   MRN: [id number]  HPI  Mr. Maldonado is a 57 year old male with a history of type 2 diabetes, tobacco abuse, hyperlipidemia, CAD, diverticulitis with surgical intervention who presents today with a chief complaint of abdominal pain.   His pain is located to the left upper and lower quadrant that began 10 days ago. He's been taking some old Amoxicillin 500 mg tablets twice daily for two days, stopped two days ago with some improvement in inflammation. He's been under a lot of stress with his wife who has been in and out of the hospital over the last several months. He denies rectal bleeding, fevers, vomiting, diarrhea. He's taken Miralax without improvement in constipation.   His last bout of diverticulitis was several years ago, this feels similar. Overall he's feeling worse.   dfdReview of Systems  Constitutional: Negative for fever.  Gastrointestinal: Positive for abdominal pain, constipation and nausea. Negative for diarrhea and vomiting.  Neurological: Negative for dizziness.       Past Medical History:  Diagnosis Date  . Arthritis   . CAD (coronary artery disease)   . CHF (congestive heart failure) (HCC)   . Diabetes mellitus without complication (HCC)   . Diverticulitis   . Hyperlipidemia   . Hypertension   . IHD (ischemic heart disease)   . Lyme disease   . Myocardial infarction [facility])      Social History   Socioeconomic History  . Marital status: Married    Spouse name: Not on file  . Number of children: Not on file  . Years of education: Not on file  . Highest education level: Not on file  Occupational History  . Not on file  Social Needs  . Financial resource strain: Not on file  . Food insecurity:    Worry: Not on file    Inability: Not on file  . Transportation needs:    Medical: Not on file    Non-medical: Not on file  Tobacco Use  . Smoking status: Current Every Day Smoker   Packs/day: 0.50    Years: 30.00    Pack years: 15.00    Types: Cigarettes  . Smokeless tobacco: Never Used  Substance and Sexual Activity  . Alcohol use: No  . Drug use: No  . Sexual activity: Never  Lifestyle  . Physical activity:    Days per week: Not on file    Minutes per session: Not on file  . Stress: Not on file  Relationships  . Social connections:    Talks on phone: Not on file    Gets together: Not on file    Attends religious service: Not on file    Active member of club or organization: Not on file    Attends meetings of clubs or organizations: Not on file    Relationship status: Not on file  . Intimate partner violence:    Fear of current or ex partner: Not on file    Emotionally abused: Not on file    Physically abused: Not on file    Forced sexual activity: Not on file  Other Topics Concern  . Not on file  Social History Narrative  . Not on file    Past Surgical History:  Procedure Laterality Date  . CARDIAC CATHETERIZATION  2012   x4 stents  . CARDIAC CATHETERIZATION  2014   x6 stents  . CARDIAC CATHETERIZATION  2014   x2 stents  . HERNIA REPAIR    . JOINT REPLACEMENT    . KNEE SURGERY Right   . SMALL INTESTINE SURGERY      Family History  Problem Relation Age of Onset  . Hyperlipidemia Mother   . Other Father        parasite infection  . Mental illness Maternal Grandmother     Allergies  Allergen Reactions  . Ivp Dye [Iodinated Diagnostic Agents] Hives    Current Outpatient Medications on File Prior to Visit  Medication Sig Dispense Refill  . ALPRAZolam (XANAX) 1 MG tablet TAKE 1 TABLET BY MOUTH 3 TIMES A DAY AS NEEDED FOR ANXIETY  5  . aspirin EC 81 MG tablet Take 81 mg by mouth daily.    Marland Kitchen. atorvastatin (LIPITOR) 80 MG tablet Take 1 tablet (80 mg total) by mouth daily. 90 tablet 3  . clopidogrel (PLAVIX) 75 MG tablet Take 1 tablet (75 mg total) by mouth daily. 90 tablet 3  . levorphanol (LEVODROMORAN) 2 MG tablet TAKE 1.5 TABLETS BY  MOUTH THREE TIMES DAILY.  0  . lisinopril (PRINIVIL,ZESTRIL) 5 MG tablet Take 1 tablet (5 mg total) by mouth daily. 90 tablet 3  . metFORMIN (GLUCOPHAGE) 500 MG tablet Take 1 tablet (500 mg total) by mouth 2 (two) times daily with a meal. 180 tablet 3  . metoprolol succinate (TOPROL-XL) 25 MG 24 hr tablet TAKE 1 TABLET BY MOUTH TWICE DAILY 180 tablet 1   No current facility-administered medications on file prior to visit.     BP 114/72   Pulse 78   Temp 98.1 F (36.7 C) (Oral)   Ht 5\' 11"  (1.803 m)   Wt 184 lb (83.5 kg)   SpO2 98%   BMI 25.66 kg/m    Objective:   Physical Exam  Constitutional: He appears well-nourished. He does not appear ill.  Cardiovascular: Normal rate and regular rhythm.  Pulmonary/Chest: Effort normal and breath sounds normal.  Abdominal: Soft. Normal appearance and bowel sounds are normal. There is tenderness in the left upper quadrant and left lower quadrant. There is no rebound.    Skin: Skin is warm and dry.          Assessment & Plan:  Abdominal Pain:  Located to left mid and lower quadrant x 10 days. Exam today suspicious for acute diverticulitis, especially given history.  Check labs today including CBC, CMP. Rx for Cipro and Flagyl courses sent to pharmacy.  Discussed clear liquid diet. Strict emergency department precautions, he verbalized understanding. At this point he appears stable for outpatient treatment.  Doreene NestKatherine K Elbie Statzer, NP

## 2018-02-24 DIAGNOSIS — M7542 Impingement syndrome of left shoulder: Secondary | ICD-10-CM | POA: Diagnosis not present

## 2018-03-25 ENCOUNTER — Emergency Department
Admission: EM | Admit: 2018-03-25 | Discharge: 2018-03-25 | Disposition: A | Discharge status: Home / Self Care | Payer: Medicare HMO | Attending: Emergency Medicine | Admitting: Emergency Medicine

## 2018-03-25 ENCOUNTER — Encounter: Payer: Self-pay | Admitting: Emergency Medicine

## 2018-03-25 ENCOUNTER — Ambulatory Visit: Payer: Self-pay | Admitting: *Deleted

## 2018-03-25 DIAGNOSIS — R42 Dizziness and giddiness: Secondary | ICD-10-CM | POA: Insufficient documentation

## 2018-03-25 DIAGNOSIS — Z5321 Procedure and treatment not carried out due to patient leaving prior to being seen by health care provider: Secondary | ICD-10-CM | POA: Insufficient documentation

## 2018-03-25 LAB — BASIC METABOLIC PANEL WITH GFR
Anion gap: 12 (ref 5–15)
BUN: 13 mg/dL (ref 6–20)
CO2: 20 mmol/L — ABNORMAL LOW (ref 22–32)
Calcium: 9.4 mg/dL (ref 8.9–10.3)
Chloride: 102 mmol/L (ref 101–111)
Creatinine, Ser: 0.75 mg/dL (ref 0.61–1.24)
GFR calc Af Amer: >60 mL/min
GFR calc non Af Amer: >60 mL/min
Glucose, Bld: 273 mg/dL — ABNORMAL HIGH (ref 65–99)
Potassium: 3.6 mmol/L (ref 3.5–5.1)
Sodium: 134 mmol/L — ABNORMAL LOW (ref 135–145)

## 2018-03-25 LAB — CBC
HCT: 45 % (ref 40.0–52.0)
Hemoglobin: 16.3 g/dL (ref 13.0–18.0)
MCH: 32.1 pg (ref 26.0–34.0)
MCHC: 36.3 g/dL — ABNORMAL HIGH (ref 32.0–36.0)
MCV: 88.5 fL (ref 80.0–100.0)
Platelets: 221 10*3/uL (ref 150–440)
RBC: 5.08 MIL/uL (ref 4.40–5.90)
RDW: 13.4 % (ref 11.5–14.5)
WBC: 9.9 10*3/uL (ref 3.8–10.6)

## 2018-03-25 NOTE — ED Triage Notes (Signed)
Patient presents to the ED with dizziness and palpitations that began this morning.  Patient reports he has had some cold symptoms and took nyquil last night.  Patient reports history of cardiac stents.  Patient is in no obvious distress at this time.  Denies chest pain.

## 2018-03-25 NOTE — Telephone Encounter (Signed)
No availability at [facility] pt advised to proceed to Urgent care    Reason for Disposition . [1] Heart beating very rapidly (e.g., > 140 / minute) AND [2] not present now  (Exception: during exercise)  Protocols used: HEART RATE AND HEARTBEAT QUESTIONS-A-AH

## 2018-03-25 NOTE — Telephone Encounter (Signed)
  Reason for Disposition . [1] Palpitations AND [2] no improvement after following Care Advice  Answer Assessment - Initial Assessment Questions 1. DESCRIPTION: "Please describe your heart rate or heart beat that you are having" (e.g., fast/slow, regular/irregular, skipped or extra beats, "palpitations")       Fast Heart beat 2. ONSET: "When did it start?" (Minutes, hours or days)         This  Am   3. DURATION: "How long does it last" (e.g., seconds, minutes, hours)        Constant x  3  Hours    4. PATTERN "Does it come and go, or has it been constant since it started?"  "Does it get worse with exertion?"   "Are you feeling it now?"       Constant   5. TAP: "Using your hand, can you tap out what you are feeling on a chair or table in front of you, so that I can hear?" (Note: not all patients can do this)      Yes  6. HEART RATE: "Can you tell me your heart rate?" "How many beats in 15 seconds?"  (Note: not all patients can do this)       120 7. RECURRENT SYMPTOM: "Have you ever had this before?" If so, ask: "When was the last time?" and "What happened that time?"       no 8. CAUSE: "What do you think is causing the palpitations?"     Taking otc cold medications  9. CARDIAC HISTORY: "Do you have any history of heart disease?" (e.g., heart attack, angina, bypass surgery, angioplasty, arrhythmia)        Heart disease   Stints    Heart attack x  2   10. OTHER SYMPTOMS: "Do you have any other symptoms?" (e.g., dizziness, chest pain, sweating, difficulty breathing)       Dizzy at times  Also has  Symptoms  Stuffy head  Coughing up phlegm   11. PREGNANCY: "Is there any chance you are pregnant?" "When was your last menstrual period?"       n/a  Protocols used: HEART RATE AND HEARTBEAT QUESTIONS-A-AH

## 2018-03-25 NOTE — ED Notes (Signed)
First Nurse Note:  Patients radial pulse is 98 and regular.  Patient complaining of head cold, taking OTC meds.  Complaining of heart racing, hands tingling.  Alert and oriented.

## 2018-03-27 ENCOUNTER — Telehealth: Payer: Self-pay | Admitting: Emergency Medicine

## 2018-03-27 NOTE — Telephone Encounter (Signed)
Called patient due to lwot to inquire about condition and follow up plans. Left message.   

## 2018-03-31 ENCOUNTER — Encounter: Payer: Self-pay | Admitting: Family Medicine

## 2018-03-31 ENCOUNTER — Ambulatory Visit: Payer: Medicare HMO | Admitting: Family Medicine

## 2018-03-31 VITALS — BP 120/78 | HR 85 | Temp 98.2°F | Ht 71.0 in | Wt 184.0 lb

## 2018-03-31 DIAGNOSIS — R05 Cough: Secondary | ICD-10-CM | POA: Diagnosis not present

## 2018-03-31 DIAGNOSIS — E119 Type 2 diabetes mellitus without complications: Secondary | ICD-10-CM | POA: Diagnosis not present

## 2018-03-31 DIAGNOSIS — J0100 Acute maxillary sinusitis, unspecified: Secondary | ICD-10-CM | POA: Diagnosis not present

## 2018-03-31 DIAGNOSIS — R059 Cough, unspecified: Secondary | ICD-10-CM

## 2018-03-31 LAB — POCT GLYCOSYLATED HEMOGLOBIN (HGB A1C): HEMOGLOBIN A1C: 6.8 % — AB (ref 4.0–5.6)

## 2018-03-31 MED ORDER — AMOXICILLIN-POT CLAVULANATE 875-125 MG PO TABS: 1.0000 | ORAL_TABLET | Freq: Two times a day (BID) | ORAL | 0 refills | Status: DC

## 2018-03-31 MED ORDER — BENZONATATE 100 MG PO CAPS: 100.0000 mg | ORAL_CAPSULE | Freq: Three times a day (TID) | ORAL | 0 refills | Status: DC | PRN

## 2018-03-31 NOTE — Patient Instructions (Addendum)
Take a daily antihistamine like cetirizine (generic Zyrtec) for drainage Can use saline nasal spray for congestion I have sent an antibiotic and cough pills to your pharmacy  Sinusitis, Adult Sinusitis is soreness and inflammation of your sinuses. Sinuses are hollow spaces in the bones around your face. They are located:  Around your eyes.  In the middle of your forehead.  Behind your nose.  In your cheekbones.  Your sinuses and nasal passages are lined with a stringy fluid (mucus). Mucus normally drains out of your sinuses. When your nasal tissues get inflamed or swollen, the mucus can get trapped or blocked so air cannot flow through your sinuses. This lets bacteria, viruses, and funguses grow, and that leads to infection. Follow these instructions at home: Medicines  Take, use, or apply over-the-counter and prescription medicines only as told by your doctor. These may include nasal sprays.  If you were prescribed an antibiotic medicine, take it as told by your doctor. Do not stop taking the antibiotic even if you start to feel better. Hydrate and Humidify  Drink enough water to keep your pee (urine) clear or pale yellow.  Use a cool mist humidifier to keep the humidity level in your home above 50%.  Breathe in steam for 10-15 minutes, 3-4 times a day or as told by your doctor. You can do this in the bathroom while a hot shower is running.  Try not to spend time in cool or dry air. Rest  Rest as much as possible.  Sleep with your head raised (elevated).  Make sure to get enough sleep each night. General instructions  Put a warm, moist washcloth on your face 3-4 times a day or as told by your doctor. This will help with discomfort.  Wash your hands often with soap and water. If there is no soap and water, use hand sanitizer.  Do not smoke. Avoid being around people who are smoking (secondhand smoke).  Keep all follow-up visits as told by your doctor. This is  important. Contact a doctor if:  You have a fever.  Your symptoms get worse.  Your symptoms do not get better within 10 days. Get help right away if:  You have a very bad headache.  You cannot stop throwing up (vomiting).  You have pain or swelling around your face or eyes.  You have trouble seeing.  You feel confused.  Your neck is stiff.  You have trouble breathing. This information is not intended to replace advice given to you by your health care provider. Make sure you discuss any questions you have with your health care provider. Document Released: 04/02/2008 Document Revised: 06/10/2016 Document Reviewed: 08/10/2015 Elsevier Interactive Patient Education  [employer]2018 Elsevier Inc.

## 2018-03-31 NOTE — Progress Notes (Signed)
Subjective:    Patient ID: John Mathis, male    DOB: 02/13/1961, 57 y.o.   MRN: [id number]  HPI This is a 57 yo male who presents today with several weeks of dry cough, green nasal drainage, nasal congestion. No known fever, no SOB, no wheeze. No ear pain.  Took some cold medication for symptoms, had some palpitations and went to ER, left before being seen.   Has had several elevated blood sugar readings recently.   Has been doing ok since wife died last month. His 57 yo son decided to move back to [place]. Patient plans to stay in this area.   Past Medical History:  Diagnosis Date  . Arthritis   . CAD (coronary artery disease)   . CHF (congestive heart failure) (HCC)   . Diabetes mellitus without complication (HCC)   . Diverticulitis   . Hyperlipidemia   . Hypertension   . IHD (ischemic heart disease)   . Lyme disease   . Myocardial infarction [facility](HCC)    Past Surgical History:  Procedure Laterality Date  . CARDIAC CATHETERIZATION  2012   x4 stents  . CARDIAC CATHETERIZATION  2014   x6 stents  . CARDIAC CATHETERIZATION  2014   x2 stents  . HERNIA REPAIR    . JOINT REPLACEMENT    . KNEE SURGERY Right   . SMALL INTESTINE SURGERY     Family History  Problem Relation Age of Onset  . Hyperlipidemia Mother   . Other Father        parasite infection  . Mental illness Maternal Grandmother    Social History   Tobacco Use  . Smoking status: Current Every Day Smoker    Packs/day: 0.50    Years: 30.00    Pack years: 15.00    Types: Cigarettes  . Smokeless tobacco: Never Used  Substance Use Topics  . Alcohol use: No  . Drug use: No      Review of Systems Per HPI    Objective:   Physical Exam  Constitutional: He is oriented to person, place, and time. He appears well-developed and well-nourished. No distress.  HENT:  Head: Normocephalic and atraumatic.  Right Ear: Tympanic membrane, external ear and ear canal normal.  Left Ear: Tympanic membrane, external ear  and ear canal normal.  Nose: Mucosal edema and rhinorrhea present. Right sinus exhibits maxillary sinus tenderness. Right sinus exhibits no frontal sinus tenderness. Left sinus exhibits maxillary sinus tenderness. Left sinus exhibits no frontal sinus tenderness.  Mouth/Throat: Uvula is midline and oropharynx is clear and moist.  Eyes: Conjunctivae are normal.  Cardiovascular: Normal rate, regular rhythm and normal heart sounds.  Pulmonary/Chest: Effort normal and breath sounds normal.  Neurological: He is alert and oriented to person, place, and time.  Skin: Skin is warm and dry. He is not diaphoretic.  Psychiatric: He has a normal mood and affect. His behavior is normal. Judgment and thought content normal.  Vitals reviewed.     BP 120/78 (BP Location: Right Arm, Patient Position: Sitting, Cuff Size: Normal)   Pulse 85   Temp 98.2 F (36.8 C) (Oral)   Ht 5\' 11"  (1.803 m)   Wt 184 lb (83.5 kg)   SpO2 97%   BMI 25.66 kg/m  Wt Readings from Last 3 Encounters:  03/31/18 184 lb (83.5 kg)  03/25/18 185 lb (83.9 kg)  02/17/18 184 lb (83.5 kg)   Results for orders placed or performed in visit on 03/31/18  HgB A1c  Result Value  Ref Range   Hemoglobin A1C 6.8 (A) 4.0 - 5.6 %   HbA1c, POC (prediabetic range)  5.7 - 6.4 %   HbA1c, POC (controlled diabetic range)  0.0 - 7.0 %       Assessment & Plan:  1. Type 2 diabetes mellitus without complication, without long-term current use of insulin (HCC) - HgB A1c- 6.8 today, up from 6.2, continue current dose metformin  2. Acute non-recurrent maxillary sinusitis -Provided written and verbal information regarding diagnosis and treatment. - amoxicillin-clavulanate (AUGMENTIN) 875-125 MG tablet; Take 1 tablet by mouth 2 (two) times daily.  Dispense: 14 tablet; Refill: 0 - RTC precautions reviewed  3. Cough - benzonatate (TESSALON) 100 MG capsule; Take 1-2 capsules (100-200 mg total) by mouth 3 (three) times daily as needed.  Dispense: 30  capsule; Refill: 0   Olean Ree, FNP-BC  [facility], [facility]  03/31/2018 11:14 AM

## 2018-04-08 DIAGNOSIS — M75122 Complete rotator cuff tear or rupture of left shoulder, not specified as traumatic: Secondary | ICD-10-CM | POA: Diagnosis not present

## 2018-04-09 ENCOUNTER — Telehealth: Payer: Self-pay | Admitting: Internal Medicine

## 2018-04-09 NOTE — Telephone Encounter (Signed)
The patient is due for follow up with Dr. Okey DupreEnd now. He should have an appointment to be seen with Dr. Okey DupreEnd or an APP for follow up and clearance.  Thanks!

## 2018-04-09 NOTE — Telephone Encounter (Signed)
Patient scheduled for 04/30/18 with Dr Okey DupreEnd

## 2018-04-09 NOTE — Telephone Encounter (Signed)
° °  [facility] Pre-operative Risk Assessment    Request for surgical clearance:  1. What type of surgery is being performed? Left Shoulder Arthroscopy With Open Roater Cuff repair   2. When is this surgery scheduled?  TBA  3. What type of clearance is required (medical clearance vs. Pharmacy clearance to hold med vs. Both)? Both   4. Are there any medications that need to be held prior to surgery and how long? Not listed  5. Practice name and name of physician performing surgery? [facility] office   6. What is your office phone number [phone]    7.   What is your office fax number [phone]  8.   Anesthesia type (None, local, MAC, general) ? General/Block

## 2018-04-09 NOTE — Telephone Encounter (Signed)
Victorino DikeJennifer- just an [place]!

## 2018-04-14 ENCOUNTER — Encounter: Payer: Self-pay | Admitting: Family Medicine

## 2018-04-29 NOTE — Progress Notes (Signed)
Follow-up Outpatient Visit Date: 04/30/2018  Primary Care Provider: Elby Beck, [place] [address]  Chief Complaint: Follow-up coronary artery disease and preoperative cardiac evaluation  HPI:  John Mathis is a 57 y.o. year-old male with history of CAD with report of 50 prior stent placements while living in [place], ischemic cardiomyopathy with LVEF of 30-35% by echo in 2017, hypertension, hyperlipidemia, diabetes mellitus, and Lyme disease, who presents for follow-up of coronary artery disease and ischemic cardiomyopathy.  I met John Mathis in December shortly after he moved to [facility].  He was feeling well at the time.  Subsequent echo showed low-normal LVEF (50-55%) with normal wall motion.  Today, John Mathis reports feeling well except for left shoulder and right knee pain.  He believes he injured his left shoulder moving a heavy wall unit.  He was recently evaluated at [facility] and will likely need operative intervention in the near future.  From a heart standpoint, he has been doing well.  He denies chest pain, shortness of breath, palpitations, lightheadedness, orthopnea, and edema.  He is tolerating his current medications well including dual antiplatelet therapy with aspirin and clopidogrel.  He is exercising regularly at the gym, doing both resistance training and cardio on the treadmill and stationary bike without limitations/symptoms.  --------------------------------------------------------------------------------------------------  Cardiovascular History & Procedures: Cardiovascular Problems:  Coronary artery disease status post multiple PCI's  Chronic systolic heart failure secondary to ischemic cardiomyopathy  Risk Factors:  Known coronary artery disease, hypertension, hyperlipidemia, diabetes mellitus, male gender, and age greater than 70  Cath/PCI:  None available.  Patient reports a total of 14 stents from 2012 through  2014  CV Surgery:  None  EP Procedures and Devices:  None  Non-Invasive Evaluation(s):  TTE (11/18/17): Mildly dilated LV with normal wall thickness.  LVEF 50-55% with normal wall motion and diastolic function.  Normal RV size and function.  TTE (01/18/16, [facility], [place], [place]): Normal LV size.  LVEF 30-35% with inferior hypokinesis.  Trace aortic and mild mitral regurgitation.  Trace to mild tricuspid regurgitation.  Normal RV size and function.   Recent CV Pertinent Labs: Lab Results  Component Value Date   CHOL 154 06/14/2017   HDL 34 (L) 06/14/2017   LDLCALC 85 06/14/2017   TRIG 175 (H) 06/14/2017   CHOLHDL 4.5 06/14/2017   K 3.6 03/25/2018   BUN 13 03/25/2018   BUN 13 06/14/2017   CREATININE 0.75 03/25/2018    Past medical and surgical history were reviewed and updated in EPIC.  Current Meds  Medication Sig  . aspirin EC 81 MG tablet Take 81 mg by mouth daily.  . clopidogrel (PLAVIX) 75 MG tablet Take 1 tablet (75 mg total) by mouth daily.  Marland Kitchen levorphanol (LEVODROMORAN) 2 MG tablet TAKE 1.5 TABLETS BY MOUTH THREE TIMES DAILY.  Marland Kitchen lisinopril (PRINIVIL,ZESTRIL) 5 MG tablet Take 1 tablet (5 mg total) by mouth daily.  . metFORMIN (GLUCOPHAGE) 500 MG tablet Take 1 tablet (500 mg total) by mouth 2 (two) times daily with a meal.  . metoprolol succinate (TOPROL-XL) 25 MG 24 hr tablet TAKE 1 TABLET BY MOUTH TWICE DAILY    Allergies: Ivp dye [iodinated diagnostic agents]  Social History   Tobacco Use  . Smoking status: Current Every Day Smoker    Packs/day: 0.50    Years: 30.00    Pack years: 15.00    Types: Cigarettes  . Smokeless tobacco: Never Used  Substance Use  Topics  . Alcohol use: No  . Drug use: No    Family History  Problem Relation Age of Onset  . Hyperlipidemia Mother   . Other Father        parasite infection  . Mental illness Maternal Grandmother     Review of Systems: A 12-system review of systems was performed  and was negative except as noted in the HPI.  --------------------------------------------------------------------------------------------------  Physical Exam: BP 110/60 (BP Location: Left Arm, Patient Position: Sitting, Cuff Size: Normal)   Pulse 75   Ht 5' 11"  (1.803 m)   Wt 187 lb (84.8 kg)   BMI 26.08 kg/m   General: NAD. HEENT: No conjunctival pallor or scleral icterus. Moist mucous membranes.  OP clear. Neck: Supple without lymphadenopathy, thyromegaly, JVD, or HJR. Lungs: Normal work of breathing. Clear to auscultation bilaterally without wheezes or crackles. Heart: Regular rate and rhythm without murmurs, rubs, or gallops. Non-displaced PMI. Abd: Bowel sounds present. Soft, NT/ND without hepatosplenomegaly Ext: No lower extremity edema.  Right knee brace in place. Skin: Warm and dry without rash.  EKG: Normal sinus rhythm with inferior Q waves.  No significant change from prior tracings.  Lab Results  Component Value Date   WBC 9.9 03/25/2018   HGB 16.3 03/25/2018   HCT 45.0 03/25/2018   MCV 88.5 03/25/2018   PLT 221 03/25/2018    Lab Results  Component Value Date   NA 134 (L) 03/25/2018   K 3.6 03/25/2018   CL 102 03/25/2018   CO2 20 (L) 03/25/2018   BUN 13 03/25/2018   CREATININE 0.75 03/25/2018   GLUCOSE 273 (H) 03/25/2018   ALT 17 02/17/2018    Lab Results  Component Value Date   CHOL 154 06/14/2017   HDL 34 (L) 06/14/2017   LDLCALC 85 06/14/2017   TRIG 175 (H) 06/14/2017   CHOLHDL 4.5 06/14/2017    --------------------------------------------------------------------------------------------------  ASSESSMENT AND PLAN: Coronary artery disease without angina John Mathis continues to do well following multiple PCI's.  He does not have any symptoms to suggest worsening coronary insufficiency.  We will continue aggressive secondary prevention and indefinite dual antiplatelet therapy with aspirin and clopidogrel.  Ischemic cardiomyopathy John Mathis  appears euvolemic and well compensated with NYHA class I symptoms.  Echocardiogram after last visit showed near normalization of LVEF, which had previously been moderately to severely reduced.  We will continue current doses of metoprolol succinate and lisinopril.  Borderline low blood pressure precludes up titration at this time.  Hyperlipidemia LDL last August was above goal at 85 (goal less than 70).  John Mathis has been making lifestyle changes and also remains on atorvastatin 80 mg daily.  I have asked him to continue with this.  He wishes to have a follow-up lipid panel with his PCP in the next few months.  If LDL remains greater than 70, addition of ezetimibe or a PCSK9 inhibitor will need to be considered.  Preoperative cardiovascular risk assessment John Mathis does not have any symptoms to suggest unstable cardiac disease.  Recent echocardiogram was reassuring.  He is able to perform more than 4 METS of activity without symptoms.  I think it is reasonable for him to proceed with elective shoulder surgery without additional cardiac testing or intervention.  Clopidogrel can be held 5 days prior to the procedure and restarted when felt safe to do so by his surgeon.  Aspirin should be continued throughout the perioperative period.  Follow-up: Return to clinic in 6 months.  Cyril Railey,  MD 04/30/2018 8:42 AM

## 2018-04-30 ENCOUNTER — Ambulatory Visit: Payer: Medicare HMO | Admitting: Internal Medicine

## 2018-04-30 ENCOUNTER — Encounter: Payer: Self-pay | Admitting: Internal Medicine

## 2018-04-30 VITALS — BP 110/60 | HR 75 | Ht 71.0 in | Wt 187.0 lb

## 2018-04-30 DIAGNOSIS — I2510 Atherosclerotic heart disease of native coronary artery without angina pectoris: Secondary | ICD-10-CM | POA: Diagnosis not present

## 2018-04-30 DIAGNOSIS — E785 Hyperlipidemia, unspecified: Secondary | ICD-10-CM

## 2018-04-30 DIAGNOSIS — I255 Ischemic cardiomyopathy: Secondary | ICD-10-CM

## 2018-04-30 DIAGNOSIS — Z01810 Encounter for preprocedural cardiovascular examination: Secondary | ICD-10-CM | POA: Diagnosis not present

## 2018-04-30 NOTE — Patient Instructions (Signed)
Medication Instructions: Your physician recommends that you continue on your current medications as directed. Please refer to the Current Medication list given to you today.  If you need a refill on your cardiac medications before your next appointment, please call your pharmacy.   Follow-Up: Your physician wants you to follow-up in 6 months with Dr. Okey DupreEnd. You will receive a reminder letter in the mail two months in advance. If you don't receive a letter, please call our office at [phone] to schedule this follow-up appointment.   Special Instructions: Please follow up with your PCP to have a fasting Lipid lab.   Thank you for choosing [facility]!

## 2018-05-09 ENCOUNTER — Emergency Department
Admission: EM | Admit: 2018-05-09 | Discharge: 2018-05-09 | Disposition: A | Discharge status: Home / Self Care | Payer: Medicare HMO | Attending: Emergency Medicine | Admitting: Emergency Medicine

## 2018-05-09 ENCOUNTER — Emergency Department: Payer: Medicare HMO

## 2018-05-09 ENCOUNTER — Encounter: Payer: Self-pay | Admitting: Emergency Medicine

## 2018-05-09 DIAGNOSIS — E119 Type 2 diabetes mellitus without complications: Secondary | ICD-10-CM | POA: Diagnosis not present

## 2018-05-09 DIAGNOSIS — Z79899 Other long term (current) drug therapy: Secondary | ICD-10-CM | POA: Diagnosis not present

## 2018-05-09 DIAGNOSIS — Z7984 Long term (current) use of oral hypoglycemic drugs: Secondary | ICD-10-CM | POA: Diagnosis not present

## 2018-05-09 DIAGNOSIS — M25562 Pain in left knee: Secondary | ICD-10-CM | POA: Diagnosis not present

## 2018-05-09 DIAGNOSIS — M79662 Pain in left lower leg: Secondary | ICD-10-CM | POA: Diagnosis not present

## 2018-05-09 DIAGNOSIS — Z7982 Long term (current) use of aspirin: Secondary | ICD-10-CM | POA: Insufficient documentation

## 2018-05-09 DIAGNOSIS — I110 Hypertensive heart disease with heart failure: Secondary | ICD-10-CM | POA: Diagnosis not present

## 2018-05-09 DIAGNOSIS — I509 Heart failure, unspecified: Secondary | ICD-10-CM | POA: Insufficient documentation

## 2018-05-09 DIAGNOSIS — M659 Synovitis and tenosynovitis, unspecified: Secondary | ICD-10-CM

## 2018-05-09 DIAGNOSIS — M76892 Other specified enthesopathies of left lower limb, excluding foot: Secondary | ICD-10-CM | POA: Diagnosis not present

## 2018-05-09 DIAGNOSIS — I2510 Atherosclerotic heart disease of native coronary artery without angina pectoris: Secondary | ICD-10-CM | POA: Insufficient documentation

## 2018-05-09 DIAGNOSIS — R69 Illness, unspecified: Secondary | ICD-10-CM | POA: Diagnosis not present

## 2018-05-09 DIAGNOSIS — F17210 Nicotine dependence, cigarettes, uncomplicated: Secondary | ICD-10-CM | POA: Insufficient documentation

## 2018-05-09 DIAGNOSIS — M65862 Other synovitis and tenosynovitis, left lower leg: Secondary | ICD-10-CM | POA: Diagnosis not present

## 2018-05-09 NOTE — ED Provider Notes (Signed)
[facility] Emergency Department Provider Note ____________________________________________  Time seen: 2238  I have reviewed the triage vital signs and the nursing notes.  HISTORY  Chief Complaint  Knee Pain  HPI John Mathis is a 57 y.o. male resents himself to the ED for evaluation of left lower leg pain. Patient describes pain to the lateral upper calf, he denies any known injury. He does admit to regular gym workouts including leg presses. He presents with exquisite tenderness to the lateral anterior calf. He denies chest pain, SOB, or edema.   Past Medical History:  Diagnosis Date  . Arthritis   . CAD (coronary artery disease)   . CHF (congestive heart failure) (HCC)   . Diabetes mellitus without complication (HCC)   . Diverticulitis   . Hyperlipidemia   . Hypertension   . IHD (ischemic heart disease)   . Lyme disease   . Myocardial infarction [facility](HCC)     Patient Active Problem List   Diagnosis Date Noted  . Coronary artery disease involving native coronary artery of native heart without angina pectoris 04/30/2018  . Ischemic cardiomyopathy 10/04/2017  . Hyperlipidemia LDL goal <70 10/04/2017  . Tobacco abuse 08/13/2017  . Long term (current) use of opiate analgesic 08/13/2017  . CAD, multiple vessel 06/14/2017  . IHD (ischemic heart disease) 06/14/2017  . Type 2 diabetes mellitus without complication, without long-term current use of insulin (HCC) 06/14/2017  . Essential hypertension 06/14/2017  . Dyslipidemia 06/14/2017  . History of right knee joint replacement 06/14/2017  . Chronic pain syndrome 06/14/2017    Past Surgical History:  Procedure Laterality Date  . CARDIAC CATHETERIZATION  2012   x4 stents  . CARDIAC CATHETERIZATION  2014   x6 stents  . CARDIAC CATHETERIZATION  2014   x2 stents  . HERNIA REPAIR    . JOINT REPLACEMENT    . KNEE SURGERY Right   . SMALL INTESTINE SURGERY      Prior to Admission medications    Medication Sig Start Date End Date Taking? Authorizing Provider  aspirin EC 81 MG tablet Take 81 mg by mouth daily.    [provider]  atorvastatin (LIPITOR) 80 MG tablet Take 1 tablet (80 mg total) by mouth daily. 10/02/17 02/17/18  End, Cristal Deerhristopher, MD  clopidogrel (PLAVIX) 75 MG tablet Take 1 tablet (75 mg total) by mouth daily. 10/02/17   End, Cristal Deerhristopher, MD  levorphanol (LEVODROMORAN) 2 MG tablet TAKE 1.5 TABLETS BY MOUTH THREE TIMES DAILY. 10/16/17   [provider]  lisinopril (PRINIVIL,ZESTRIL) 5 MG tablet Take 1 tablet (5 mg total) by mouth daily. 10/02/17   End, Cristal Deerhristopher, MD  metFORMIN (GLUCOPHAGE) 500 MG tablet Take 1 tablet (500 mg total) by mouth 2 (two) times daily with a meal. 01/31/18   Emi BelfastGessner, Deborah B, FNP  metoprolol succinate (TOPROL-XL) 25 MG 24 hr tablet TAKE 1 TABLET BY MOUTH TWICE DAILY 01/20/18   Emi BelfastGessner, Deborah B, FNP    Allergies Ivp dye [iodinated diagnostic agents]  Family History  Problem Relation Age of Onset  . Hyperlipidemia Mother   . Other Father        parasite infection  . Mental illness Maternal Grandmother     Social History Social History   Tobacco Use  . Smoking status: Current Every Day Smoker    Packs/day: 0.50    Years: 30.00    Pack years: 15.00    Types: Cigarettes  . Smokeless tobacco: Never Used  Substance Use Topics  . Alcohol use:  No  . Drug use: No    Review of Systems  Constitutional: Negative for fever. Cardiovascular: Negative for chest pain. Respiratory: Negative for shortness of breath. Musculoskeletal: Negative for back pain.  Left leg pain as above. Skin: Negative for rash. Neurological: Negative for headaches, focal weakness or numbness. ____________________________________________  PHYSICAL EXAM:  VITAL SIGNS: ED Triage Vitals  Enc Vitals Group     BP 05/09/18 2203 124/84     Pulse Rate 05/09/18 2203 76     Resp 05/09/18 2203 18     Temp 05/09/18 2203 98.3 F (36.8 C)     Temp  Source 05/09/18 2203 Oral     SpO2 05/09/18 2203 97 %     Weight 05/09/18 2200 185 lb (83.9 kg)     Height 05/09/18 2200 5\' 11"  (1.803 m)     Head Circumference --      Peak Flow --      Pain Score 05/09/18 2200 10     Pain Loc --      Pain Edu? --      Excl. in GC? --     Constitutional: Alert and oriented. Well appearing and in no distress. Head: Normocephalic and atraumatic. Cardiovascular: Normal rate, regular rhythm. Normal distal pulses. Respiratory: Normal respiratory effort.  Musculoskeletal: Left lower extremity without any obvious deformity, dislocation, or joint effusion.  Patient with tenderness to palpation to the anterior tibialis origin to the lateral proximal calf.  No palpable cords noted to the gastroc region.  No popliteal space fullness is noted.  Knee exam is otherwise benign with full range of motion.  Nontender with normal range of motion in all extremities.  Neurologic:  Normal gait without ataxia. Normal speech and language. No gross focal neurologic deficits are appreciated. Skin:  Skin is warm, dry and intact. No rash noted. ____________________________________________   RADIOLOGY  Left Knee IMPRESSION: Negative. ____________________________________________  PROCEDURES  Procedures Ace bandage  ____________________________________________  INITIAL IMPRESSION / ASSESSMENT AND PLAN / ED COURSE  Patient with ED evaluation of pain to the left lower leg.  Patient's x-rays negative for any acute fracture dislocation.  His exam is also benign in terms of any internal derangement being suspected.  He does have tenderness at the anterior tibialis muscle origin.  Symptoms may represent a small tear or tendinitis in his region.  Patient will be placed in Ace bandage around the upper leg for comfort and support.  He will ambulate with the single-point cane he brought in.  He will follow-up with Ortho provider next week if symptoms  persist. ____________________________________________  FINAL CLINICAL IMPRESSION(S) / ED DIAGNOSES  Final diagnoses:  Tibialis anterior tenosynovitis      Lissa Hoard, PA-C 05/09/18 2345    Merrily Brittle, MD 05/10/18 0041

## 2018-05-09 NOTE — ED Notes (Signed)

## 2018-05-09 NOTE — ED Triage Notes (Signed)
Patient with complaint of left knee pain that started about 17:00 tonight. Patient states that he can not put weight on his leg. Patient denies any injury.

## 2018-05-09 NOTE — Discharge Instructions (Addendum)
You appear to have a minor tear to the tibialis anterior muscle of the lower leg. Take your home meds as directed. Follow-up with your ortho provider for further management.

## 2018-05-15 ENCOUNTER — Other Ambulatory Visit: Payer: Self-pay | Admitting: Orthopedic Surgery

## 2018-05-19 ENCOUNTER — Other Ambulatory Visit: Payer: Self-pay

## 2018-05-19 ENCOUNTER — Encounter
Admission: RE | Admit: 2018-05-19 | Discharge: 2018-05-19 | Disposition: A | Discharge status: Home / Self Care | Payer: Medicare HMO | Source: Ambulatory Visit | Attending: Orthopedic Surgery | Admitting: Orthopedic Surgery

## 2018-05-19 DIAGNOSIS — M75122 Complete rotator cuff tear or rupture of left shoulder, not specified as traumatic: Secondary | ICD-10-CM | POA: Diagnosis present

## 2018-05-19 DIAGNOSIS — Z7982 Long term (current) use of aspirin: Secondary | ICD-10-CM | POA: Diagnosis not present

## 2018-05-19 DIAGNOSIS — M7542 Impingement syndrome of left shoulder: Secondary | ICD-10-CM | POA: Diagnosis not present

## 2018-05-19 DIAGNOSIS — M19012 Primary osteoarthritis, left shoulder: Secondary | ICD-10-CM | POA: Diagnosis not present

## 2018-05-19 DIAGNOSIS — Z79899 Other long term (current) drug therapy: Secondary | ICD-10-CM | POA: Diagnosis not present

## 2018-05-19 DIAGNOSIS — I2510 Atherosclerotic heart disease of native coronary artery without angina pectoris: Secondary | ICD-10-CM | POA: Diagnosis not present

## 2018-05-19 DIAGNOSIS — I509 Heart failure, unspecified: Secondary | ICD-10-CM | POA: Diagnosis not present

## 2018-05-19 DIAGNOSIS — E785 Hyperlipidemia, unspecified: Secondary | ICD-10-CM | POA: Diagnosis not present

## 2018-05-19 DIAGNOSIS — R69 Illness, unspecified: Secondary | ICD-10-CM | POA: Diagnosis not present

## 2018-05-19 DIAGNOSIS — Z955 Presence of coronary angioplasty implant and graft: Secondary | ICD-10-CM | POA: Diagnosis not present

## 2018-05-19 DIAGNOSIS — F329 Major depressive disorder, single episode, unspecified: Secondary | ICD-10-CM | POA: Diagnosis not present

## 2018-05-19 DIAGNOSIS — F17210 Nicotine dependence, cigarettes, uncomplicated: Secondary | ICD-10-CM | POA: Diagnosis not present

## 2018-05-19 DIAGNOSIS — I110 Hypertensive heart disease with heart failure: Secondary | ICD-10-CM | POA: Diagnosis not present

## 2018-05-19 DIAGNOSIS — Z7902 Long term (current) use of antithrombotics/antiplatelets: Secondary | ICD-10-CM | POA: Diagnosis not present

## 2018-05-19 DIAGNOSIS — Z7984 Long term (current) use of oral hypoglycemic drugs: Secondary | ICD-10-CM | POA: Diagnosis not present

## 2018-05-19 DIAGNOSIS — E119 Type 2 diabetes mellitus without complications: Secondary | ICD-10-CM | POA: Diagnosis not present

## 2018-05-19 DIAGNOSIS — I259 Chronic ischemic heart disease, unspecified: Secondary | ICD-10-CM | POA: Diagnosis not present

## 2018-05-19 DIAGNOSIS — I252 Old myocardial infarction: Secondary | ICD-10-CM | POA: Diagnosis not present

## 2018-05-19 DIAGNOSIS — M7552 Bursitis of left shoulder: Secondary | ICD-10-CM | POA: Diagnosis not present

## 2018-05-19 DIAGNOSIS — Z91041 Radiographic dye allergy status: Secondary | ICD-10-CM | POA: Diagnosis not present

## 2018-05-19 HISTORY — DX: Depression, unspecified: F32.A

## 2018-05-19 HISTORY — DX: Major depressive disorder, single episode, unspecified: F32.9

## 2018-05-19 LAB — CBC WITH DIFFERENTIAL/PLATELET
BASOS PCT: 0 %
Basophils Absolute: 0 10*3/uL (ref 0–0.1)
EOS ABS: 0.1 10*3/uL (ref 0–0.7)
Eosinophils Relative: 1 %
HCT: 48 % (ref 40.0–52.0)
Hemoglobin: 17.2 g/dL (ref 13.0–18.0)
Lymphocytes Relative: 22 %
Lymphs Abs: 1.7 10*3/uL (ref 1.0–3.6)
MCH: 32 pg (ref 26.0–34.0)
MCHC: 35.7 g/dL (ref 32.0–36.0)
MCV: 89.5 fL (ref 80.0–100.0)
MONOS PCT: 7 %
Monocytes Absolute: 0.5 10*3/uL (ref 0.2–1.0)
Neutro Abs: 5.5 10*3/uL (ref 1.4–6.5)
Neutrophils Relative %: 70 %
Platelets: 247 10*3/uL (ref 150–440)
RBC: 5.37 MIL/uL (ref 4.40–5.90)
RDW: 13.7 % (ref 11.5–14.5)
WBC: 7.7 10*3/uL (ref 3.8–10.6)

## 2018-05-19 LAB — BASIC METABOLIC PANEL
Anion gap: 7 (ref 5–15)
BUN: 12 mg/dL (ref 6–20)
CALCIUM: 9.8 mg/dL (ref 8.9–10.3)
CO2: 28 mmol/L (ref 22–32)
CREATININE: 0.76 mg/dL (ref 0.61–1.24)
Chloride: 105 mmol/L (ref 98–111)
GFR calc Af Amer: >60 mL/min (ref >60)
GLUCOSE: 133 mg/dL — AB (ref 70–99)
Potassium: 4 mmol/L (ref 3.5–5.1)
Sodium: 140 mmol/L (ref 135–145)

## 2018-05-19 LAB — PROTIME-INR
INR: 1
Prothrombin Time: 13.1 seconds (ref 11.4–15.2)

## 2018-05-19 LAB — APTT: APTT: 32 s (ref 24–36)

## 2018-05-19 NOTE — Patient Instructions (Signed)
Your procedure is scheduled on: May 21, 2018 THURSDAY Report to Day Surgery [facility]nd [facility] To find out your arrival time, please call [phone] between 1PM - 3PM on: Wednesday May 20, 2018  REMEMBER: Instructions that are not followed completely may result in serious medical risk, up to and including death; or upon the discretion of your surgeon and anesthesiologist your surgery may need to be rescheduled.  Do not eat food after midnight the night before your procedure.  No gum chewing, lozengers or hard candies.  You may however, drink CLEAR liquids up to 2 hours before you are scheduled to arrive for your surgery. Do not drink anything within 2 hours of the start of your surgery.  Clear liquids include: - water   Do NOT drink anything that is not on this list.  Type 1 and Type 2 diabetics should only drink water.  No Alcohol for 24 hours before or after surgery.  No Smoking including e-cigarettes for 24 hours prior to surgery.  No chewable tobacco products for at least 6 hours prior to surgery.  No nicotine patches on the day of surgery.  On the morning of surgery brush your teeth with toothpaste and water, you may rinse your mouth with mouthwash if you wish. Do not swallow any toothpaste or mouthwash.  Notify your doctor if there is any change in your medical condition (cold, fever, infection).  Do not wear jewelry, make-up, hairpins, clips or nail polish.  Do not wear lotions, powders, or perfumes. You may wear deodorant.  Do not shave 48 hours prior to surgery. Men may shave face and neck.  Contacts and dentures may not be worn into surgery.  Do not bring valuables to the hospital, including drivers license, insurance or credit cards.  [facility] is not responsible for any belongings or valuables.   TAKE THESE MEDICATIONS THE MORNING OF SURGERY: METOPROLOL  Use CHG Soap or wipes as directed on instruction sheet.  Stop Metformin  2 days  prior to surgery.  Follow recommendations from Cardiologist, Pulmonologist or PCP regarding stopping, Plavix STOP 5 DAYS BEFORE AND CONTINUE TAKING ASPIRIN.  Stop Anti-inflammatories (NSAIDS) such as Advil, Aleve, Ibuprofen, Motrin, Naproxen, Naprosyn and Aspirin based products such as Excedrin, Goodys Powder, BC Powder. (May take Tylenol or Acetaminophen if needed.)  Stop ANY OVER THE COUNTER supplements until after surgery. (May continue Vitamin D, Vitamin B, and multivitamin.)  Wear comfortable clothing (specific to your surgery type) to the hospital.  Plan for stool softeners for home use.  If you are being discharged the day of surgery, you will not be allowed to drive home. You will need a responsible adult to drive you home and stay with you that night.   If you are taking public transportation, you will need to have a responsible adult with you. Please confirm with your physician that it is acceptable to use public transportation.   Please call [phone] if you have any questions about these instructions.

## 2018-05-21 MED ORDER — CEFAZOLIN SODIUM-DEXTROSE 2-4 GM/100ML-% IV SOLN
2.0000 g | INTRAVENOUS | Status: AC
  Administered 2018-05-22: 2 g via INTRAVENOUS

## 2018-05-22 ENCOUNTER — Ambulatory Visit
Admission: RE | Admit: 2018-05-22 | Discharge: 2018-05-22 | Disposition: A | Discharge status: Home / Self Care | Payer: Medicare HMO | Source: Ambulatory Visit | Attending: Orthopedic Surgery | Admitting: Orthopedic Surgery

## 2018-05-22 ENCOUNTER — Ambulatory Visit: Payer: Medicare HMO | Admitting: Anesthesiology

## 2018-05-22 ENCOUNTER — Encounter: Payer: Self-pay | Admitting: Anesthesiology

## 2018-05-22 ENCOUNTER — Encounter
Admission: RE | Disposition: A | Discharge status: Home / Self Care | Payer: Self-pay | Source: Ambulatory Visit | Attending: Orthopedic Surgery

## 2018-05-22 ENCOUNTER — Other Ambulatory Visit: Payer: Self-pay

## 2018-05-22 DIAGNOSIS — M7542 Impingement syndrome of left shoulder: Secondary | ICD-10-CM | POA: Diagnosis not present

## 2018-05-22 DIAGNOSIS — Z79899 Other long term (current) drug therapy: Secondary | ICD-10-CM | POA: Insufficient documentation

## 2018-05-22 DIAGNOSIS — I2510 Atherosclerotic heart disease of native coronary artery without angina pectoris: Secondary | ICD-10-CM | POA: Diagnosis not present

## 2018-05-22 DIAGNOSIS — F329 Major depressive disorder, single episode, unspecified: Secondary | ICD-10-CM | POA: Insufficient documentation

## 2018-05-22 DIAGNOSIS — I259 Chronic ischemic heart disease, unspecified: Secondary | ICD-10-CM | POA: Insufficient documentation

## 2018-05-22 DIAGNOSIS — R69 Illness, unspecified: Secondary | ICD-10-CM | POA: Diagnosis not present

## 2018-05-22 DIAGNOSIS — Z7902 Long term (current) use of antithrombotics/antiplatelets: Secondary | ICD-10-CM | POA: Insufficient documentation

## 2018-05-22 DIAGNOSIS — E119 Type 2 diabetes mellitus without complications: Secondary | ICD-10-CM | POA: Diagnosis not present

## 2018-05-22 DIAGNOSIS — Z7984 Long term (current) use of oral hypoglycemic drugs: Secondary | ICD-10-CM | POA: Insufficient documentation

## 2018-05-22 DIAGNOSIS — E785 Hyperlipidemia, unspecified: Secondary | ICD-10-CM | POA: Insufficient documentation

## 2018-05-22 DIAGNOSIS — M75122 Complete rotator cuff tear or rupture of left shoulder, not specified as traumatic: Secondary | ICD-10-CM | POA: Diagnosis not present

## 2018-05-22 DIAGNOSIS — I110 Hypertensive heart disease with heart failure: Secondary | ICD-10-CM | POA: Insufficient documentation

## 2018-05-22 DIAGNOSIS — M19012 Primary osteoarthritis, left shoulder: Secondary | ICD-10-CM | POA: Diagnosis not present

## 2018-05-22 DIAGNOSIS — Z7982 Long term (current) use of aspirin: Secondary | ICD-10-CM | POA: Insufficient documentation

## 2018-05-22 DIAGNOSIS — I509 Heart failure, unspecified: Secondary | ICD-10-CM | POA: Insufficient documentation

## 2018-05-22 DIAGNOSIS — G8918 Other acute postprocedural pain: Secondary | ICD-10-CM | POA: Diagnosis not present

## 2018-05-22 DIAGNOSIS — I252 Old myocardial infarction: Secondary | ICD-10-CM | POA: Insufficient documentation

## 2018-05-22 DIAGNOSIS — F17210 Nicotine dependence, cigarettes, uncomplicated: Secondary | ICD-10-CM | POA: Insufficient documentation

## 2018-05-22 DIAGNOSIS — Z955 Presence of coronary angioplasty implant and graft: Secondary | ICD-10-CM | POA: Insufficient documentation

## 2018-05-22 DIAGNOSIS — M7552 Bursitis of left shoulder: Secondary | ICD-10-CM | POA: Insufficient documentation

## 2018-05-22 DIAGNOSIS — Z91041 Radiographic dye allergy status: Secondary | ICD-10-CM | POA: Insufficient documentation

## 2018-05-22 DIAGNOSIS — M25512 Pain in left shoulder: Secondary | ICD-10-CM | POA: Diagnosis not present

## 2018-05-22 HISTORY — PX: SHOULDER ARTHROSCOPY WITH OPEN ROTATOR CUFF REPAIR AND DISTAL CLAVICLE ACROMINECTOMY: SHX5683

## 2018-05-22 LAB — GLUCOSE, CAPILLARY
GLUCOSE-CAPILLARY: 130 mg/dL — AB (ref 70–99)
Glucose-Capillary: 111 mg/dL — ABNORMAL HIGH (ref 70–99)

## 2018-05-22 SURGERY — SHOULDER ARTHROSCOPY WITH OPEN ROTATOR CUFF REPAIR AND DISTAL CLAVICLE ACROMINECTOMY
Anesthesia: General | Site: Shoulder | Laterality: Left | Wound class: Clean

## 2018-05-22 MED ORDER — BUPIVACAINE LIPOSOME 1.3 % IJ SUSP
INTRAMUSCULAR | Status: AC
  Filled 2018-05-22: qty 20

## 2018-05-22 MED ORDER — SUCCINYLCHOLINE CHLORIDE 20 MG/ML IJ SOLN
INTRAMUSCULAR | Status: DC | PRN
  Administered 2018-05-22: 100 mg via INTRAVENOUS
  Administered 2018-05-22: 80 mg via INTRAVENOUS

## 2018-05-22 MED ORDER — BUPIVACAINE HCL (PF) 0.25 % IJ SOLN
INTRAMUSCULAR | Status: AC
  Filled 2018-05-22: qty 30

## 2018-05-22 MED ORDER — DEXAMETHASONE SODIUM PHOSPHATE 10 MG/ML IJ SOLN
INTRAMUSCULAR | Status: AC
  Filled 2018-05-22: qty 1

## 2018-05-22 MED ORDER — LIDOCAINE HCL (PF) 1 % IJ SOLN
INTRAMUSCULAR | Status: AC
  Filled 2018-05-22: qty 30

## 2018-05-22 MED ORDER — CHLORHEXIDINE GLUCONATE CLOTH 2 % EX PADS: 6.0000 | MEDICATED_PAD | Freq: Once | CUTANEOUS | Status: DC

## 2018-05-22 MED ORDER — MIDAZOLAM HCL 2 MG/2ML IJ SOLN
INTRAMUSCULAR | Status: AC
  Administered 2018-05-22: 1 mg via INTRAVENOUS
  Filled 2018-05-22: qty 2

## 2018-05-22 MED ORDER — CEFAZOLIN SODIUM-DEXTROSE 2-4 GM/100ML-% IV SOLN
INTRAVENOUS | Status: AC
  Filled 2018-05-22: qty 100

## 2018-05-22 MED ORDER — PROPOFOL 10 MG/ML IV BOLUS
INTRAVENOUS | Status: AC
  Filled 2018-05-22: qty 20

## 2018-05-22 MED ORDER — BUPIVACAINE LIPOSOME 1.3 % IJ SUSP
INTRAMUSCULAR | Status: DC | PRN
  Administered 2018-05-22: 20 mL

## 2018-05-22 MED ORDER — PHENYLEPHRINE HCL 10 MG/ML IJ SOLN
INTRAMUSCULAR | Status: DC | PRN
  Administered 2018-05-22: 100 ug via INTRAVENOUS

## 2018-05-22 MED ORDER — ONDANSETRON HCL 4 MG PO TABS: 4.0000 mg | ORAL_TABLET | Freq: Three times a day (TID) | ORAL | 0 refills | Status: DC | PRN

## 2018-05-22 MED ORDER — ONDANSETRON HCL 4 MG/2ML IJ SOLN
INTRAMUSCULAR | Status: DC | PRN
  Administered 2018-05-22: 4 mg via INTRAVENOUS

## 2018-05-22 MED ORDER — LACTATED RINGERS IV SOLN
INTRAVENOUS | Status: DC
  Administered 2018-05-22: 11:00:00 via INTRAVENOUS

## 2018-05-22 MED ORDER — ONDANSETRON HCL 4 MG/2ML IJ SOLN: 4.0000 mg | Freq: Once | INTRAMUSCULAR | Status: DC | PRN

## 2018-05-22 MED ORDER — ONDANSETRON HCL 4 MG/2ML IJ SOLN
INTRAMUSCULAR | Status: AC
  Filled 2018-05-22: qty 2

## 2018-05-22 MED ORDER — ROCURONIUM BROMIDE 50 MG/5ML IV SOLN
INTRAVENOUS | Status: AC
  Filled 2018-05-22: qty 1

## 2018-05-22 MED ORDER — FENTANYL CITRATE (PF) 100 MCG/2ML IJ SOLN: 25.0000 ug | INTRAMUSCULAR | Status: DC | PRN

## 2018-05-22 MED ORDER — BUPIVACAINE HCL (PF) 0.5 % IJ SOLN
INTRAMUSCULAR | Status: DC | PRN
  Administered 2018-05-22: 10 mL

## 2018-05-22 MED ORDER — PROPOFOL 10 MG/ML IV BOLUS
INTRAVENOUS | Status: DC | PRN
  Administered 2018-05-22: 100 mg via INTRAVENOUS
  Administered 2018-05-22: 150 mg via INTRAVENOUS

## 2018-05-22 MED ORDER — SUCCINYLCHOLINE CHLORIDE 20 MG/ML IJ SOLN
INTRAMUSCULAR | Status: AC
  Filled 2018-05-22: qty 1

## 2018-05-22 MED ORDER — SUGAMMADEX SODIUM 200 MG/2ML IV SOLN
INTRAVENOUS | Status: DC | PRN
  Administered 2018-05-22: 200 mg via INTRAVENOUS

## 2018-05-22 MED ORDER — EPINEPHRINE PF 1 MG/ML IJ SOLN
INTRAMUSCULAR | Status: DC | PRN
  Administered 2018-05-22: 13 mL

## 2018-05-22 MED ORDER — GLYCOPYRROLATE 0.2 MG/ML IJ SOLN
INTRAMUSCULAR | Status: DC | PRN
  Administered 2018-05-22: 0.2 mg via INTRAVENOUS

## 2018-05-22 MED ORDER — FENTANYL CITRATE (PF) 100 MCG/2ML IJ SOLN
50.0000 ug | Freq: Once | INTRAMUSCULAR | Status: AC
  Administered 2018-05-22: 50 ug via INTRAVENOUS

## 2018-05-22 MED ORDER — FENTANYL CITRATE (PF) 100 MCG/2ML IJ SOLN
INTRAMUSCULAR | Status: DC | PRN
  Administered 2018-05-22 (×2): 50 ug via INTRAVENOUS

## 2018-05-22 MED ORDER — FAMOTIDINE 20 MG PO TABS: 20.0000 mg | ORAL_TABLET | Freq: Once | ORAL | Status: DC

## 2018-05-22 MED ORDER — MIDAZOLAM HCL 2 MG/2ML IJ SOLN
INTRAMUSCULAR | Status: AC
  Filled 2018-05-22: qty 2

## 2018-05-22 MED ORDER — MIDAZOLAM HCL 2 MG/2ML IJ SOLN
1.0000 mg | Freq: Once | INTRAMUSCULAR | Status: AC
  Administered 2018-05-22: 1 mg via INTRAVENOUS

## 2018-05-22 MED ORDER — BUPIVACAINE-EPINEPHRINE (PF) 0.25% -1:200000 IJ SOLN
INTRAMUSCULAR | Status: DC | PRN
  Administered 2018-05-22: 30 mL

## 2018-05-22 MED ORDER — LIDOCAINE HCL (PF) 2 % IJ SOLN
INTRAMUSCULAR | Status: AC
  Filled 2018-05-22: qty 10

## 2018-05-22 MED ORDER — GLYCOPYRROLATE 0.2 MG/ML IJ SOLN
INTRAMUSCULAR | Status: AC
  Filled 2018-05-22: qty 1

## 2018-05-22 MED ORDER — EPINEPHRINE 30 MG/30ML IJ SOLN
INTRAMUSCULAR | Status: AC
Start: 2018-05-22 — End: ?
  Filled 2018-05-22: qty 1

## 2018-05-22 MED ORDER — LIDOCAINE HCL (CARDIAC) PF 100 MG/5ML IV SOSY
PREFILLED_SYRINGE | INTRAVENOUS | Status: DC | PRN
  Administered 2018-05-22: 80 mg via INTRAVENOUS

## 2018-05-22 MED ORDER — DEXAMETHASONE SODIUM PHOSPHATE 10 MG/ML IJ SOLN
INTRAMUSCULAR | Status: DC | PRN
  Administered 2018-05-22: 5 mg via INTRAVENOUS

## 2018-05-22 MED ORDER — SODIUM CHLORIDE 0.9 % IV SOLN
INTRAVENOUS | Status: DC | PRN
  Administered 2018-05-22: 15 ug/min via INTRAVENOUS

## 2018-05-22 MED ORDER — FENTANYL CITRATE (PF) 100 MCG/2ML IJ SOLN
INTRAMUSCULAR | Status: AC
  Administered 2018-05-22: 50 ug via INTRAVENOUS
  Filled 2018-05-22: qty 2

## 2018-05-22 MED ORDER — BUPIVACAINE HCL (PF) 0.5 % IJ SOLN
INTRAMUSCULAR | Status: AC
  Filled 2018-05-22: qty 10

## 2018-05-22 MED ORDER — CEPHALEXIN 500 MG PO CAPS
500.0000 mg | ORAL_CAPSULE | Freq: Four times a day (QID) | ORAL | 0 refills | Status: DC
Start: 2018-05-22 — End: 2018-10-31

## 2018-05-22 MED ORDER — SULFAMETHOXAZOLE-TRIMETHOPRIM 800-160 MG PO TABS
1.0000 | ORAL_TABLET | Freq: Two times a day (BID) | ORAL | 0 refills | Status: DC
Start: 2018-05-22 — End: 2018-10-31

## 2018-05-22 MED ORDER — NEOMYCIN-POLYMYXIN B GU 40-200000 IR SOLN
Status: AC
Start: 2018-05-22 — End: ?
  Filled 2018-05-22: qty 2

## 2018-05-22 MED ORDER — OXYCODONE HCL 5 MG PO TABS: 5.0000 mg | ORAL_TABLET | ORAL | 0 refills | Status: DC | PRN

## 2018-05-22 MED ORDER — NEOMYCIN-POLYMYXIN B GU 40-200000 IR SOLN
Status: DC | PRN
  Administered 2018-05-22: 2 mL

## 2018-05-22 MED ORDER — ROCURONIUM BROMIDE 100 MG/10ML IV SOLN
INTRAVENOUS | Status: DC | PRN
  Administered 2018-05-22: 25 mg via INTRAVENOUS
  Administered 2018-05-22: 5 mg via INTRAVENOUS

## 2018-05-22 MED ORDER — FENTANYL CITRATE (PF) 100 MCG/2ML IJ SOLN
INTRAMUSCULAR | Status: AC
  Filled 2018-05-22: qty 2

## 2018-05-22 SURGICAL SUPPLY — 77 items
ADAPTER IRRIG TUBE 2 SPIKE SOL (ADAPTER) IMPLANT
ANCHOR ALL-SUT Q-FIX 2.8 (Anchor) ×2 IMPLANT
ANCHOR SUT 5.5 MULTIFIX (Orthopedic Implant) ×2 IMPLANT
BLADE SHAVER BONE 5.0X13 (MISCELLANEOUS) ×2 IMPLANT
BUR RADIUS 4.0X18.5 (BURR) IMPLANT
BUR RADIUS 5.5 (BURR) IMPLANT
CANISTER SUCT LVC 12 LTR MEDI- (MISCELLANEOUS) IMPLANT
CANNULA 5.75X7 CRYSTAL CLEAR (CANNULA) ×4 IMPLANT
CANNULA PARTIAL THREAD 2X7 (CANNULA) ×2 IMPLANT
CANNULA TWIST IN 8.25X9CM (CANNULA) ×4 IMPLANT
CHLORAPREP W/TINT 26ML (MISCELLANEOUS) ×8 IMPLANT
CONNECTOR PERFECT PASSER (CONNECTOR) ×2 IMPLANT
COOLER POLAR GLACIER W/PUMP (MISCELLANEOUS) ×2 IMPLANT
CRADLE LAMINECT ARM (MISCELLANEOUS) ×4 IMPLANT
CUTTER BONE 4.0MM X 13CM (MISCELLANEOUS) ×2 IMPLANT
DEVICE SUCT BLK HOLE OR FLOOR (MISCELLANEOUS) ×4 IMPLANT
DRAPE IMP U-DRAPE 54X76 (DRAPES) ×4 IMPLANT
DRAPE INCISE IOBAN 66X45 STRL (DRAPES) ×2 IMPLANT
DRAPE SHEET LG 3/4 BI-LAMINATE (DRAPES) ×2 IMPLANT
DRAPE U-SHAPE 47X51 STRL (DRAPES) ×2 IMPLANT
DURAPREP 26ML APPLICATOR (WOUND CARE) IMPLANT
DW OUTFLOW CASSETTE/TUBE SET (MISCELLANEOUS) ×2 IMPLANT
ELECT REM PT RETURN 9FT ADLT (ELECTROSURGICAL) ×2
ELECTRODE REM PT RTRN 9FT ADLT (ELECTROSURGICAL) ×1 IMPLANT
GAUZE PETRO XEROFOAM 1X8 (MISCELLANEOUS) ×2 IMPLANT
GAUZE SPONGE 4X4 12PLY STRL (GAUZE/BANDAGES/DRESSINGS) ×2 IMPLANT
GLOVE BIO SURGEON STRL SZ7 (GLOVE) ×4 IMPLANT
GLOVE BIOGEL PI IND STRL 7.5 (GLOVE) ×2 IMPLANT
GLOVE BIOGEL PI IND STRL 9 (GLOVE) ×1 IMPLANT
GLOVE BIOGEL PI INDICATOR 7.5 (GLOVE) ×2
GLOVE BIOGEL PI INDICATOR 9 (GLOVE) ×1
GLOVE SURG 9.0 ORTHO LTXF (GLOVE) ×6 IMPLANT
GOWN STRL REUS TWL 2XL XL LVL4 (GOWN DISPOSABLE) ×2 IMPLANT
GOWN STRL REUS W/ TWL LRG LVL3 (GOWN DISPOSABLE) ×1 IMPLANT
GOWN STRL REUS W/TWL LRG LVL3 (GOWN DISPOSABLE) ×1
IV LACTATED RINGER IRRG 3000ML (IV SOLUTION) ×11
IV LR IRRIG 3000ML ARTHROMATIC (IV SOLUTION) ×11 IMPLANT
KIT STABILIZATION SHOULDER (MISCELLANEOUS) ×2 IMPLANT
KIT SUTURE 2.8 Q-FIX DISP (MISCELLANEOUS) ×2 IMPLANT
KIT SUTURETAK 3.0 INSERT PERC (KITS) IMPLANT
KIT TURNOVER KIT A (KITS) ×2 IMPLANT
MANIFOLD NEPTUNE II (INSTRUMENTS) ×2 IMPLANT
MASK FACE SPIDER DISP (MASK) ×2 IMPLANT
MAT BLUE FLOOR 46X72 FLO (MISCELLANEOUS) ×4 IMPLANT
NDL SAFETY ECLIPSE 18X1.5 (NEEDLE) ×1 IMPLANT
NEEDLE HYPO 18GX1.5 SHARP (NEEDLE) ×1
NEEDLE HYPO 22GX1.5 SAFETY (NEEDLE) ×2 IMPLANT
NS IRRIG 500ML POUR BTL (IV SOLUTION) ×2 IMPLANT
PACK ARTHROSCOPY SHOULDER (MISCELLANEOUS) ×2 IMPLANT
PAD WRAPON POLAR SHDR XLG (MISCELLANEOUS) ×1 IMPLANT
PASSER SUT CAPTURE FIRST (SUTURE) ×2 IMPLANT
PROBE APOLLO 90XL (SURGICAL WAND) ×2 IMPLANT
SET ADAPTER IRRIG ARTHO 72IN Y (ADAPTER) ×2 IMPLANT
SET TUBE SUCT SHAVER OUTFL 24K (TUBING) IMPLANT
SET TUBE TIP INTRA-ARTICULAR (MISCELLANEOUS) IMPLANT
STRIP CLOSURE SKIN 1/2X4 (GAUZE/BANDAGES/DRESSINGS) ×2 IMPLANT
SUT ETHILON 4-0 (SUTURE) ×1
SUT ETHILON 4-0 FS2 18XMFL BLK (SUTURE) ×1
SUT LASSO 90 DEG SD STR (SUTURE) IMPLANT
SUT MNCRL 4-0 (SUTURE) ×1
SUT MNCRL 4-0 27XMFL (SUTURE) ×1
SUT PDS AB 0 CT1 27 (SUTURE) ×2 IMPLANT
SUT PERFECTPASSER WHITE CART (SUTURE) ×2 IMPLANT
SUT SMART STITCH CARTRIDGE (SUTURE) ×4 IMPLANT
SUT VIC AB 0 CT1 36 (SUTURE) ×2 IMPLANT
SUT VIC AB 2-0 CT2 27 (SUTURE) ×2 IMPLANT
SUTURE ETHLN 4-0 FS2 18XMF BLK (SUTURE) ×1 IMPLANT
SUTURE MAGNUM WIRE 2X48 BLK (SUTURE) IMPLANT
SUTURE MNCRL 4-0 27XMF (SUTURE) ×1 IMPLANT
SYR 10ML LL (SYRINGE) ×2 IMPLANT
TAPE MICROFOAM 4IN (TAPE) ×2 IMPLANT
TUBING ARTHRO INFLOW-ONLY STRL (TUBING) IMPLANT
TUBING CONNECTING 10 (TUBING) ×2 IMPLANT
TUBING REDEUCE PAT W/CON 8IN (MISCELLANEOUS) ×2 IMPLANT
TUBING REDEUCE PUMP W/CON 8IN (MISCELLANEOUS) ×2 IMPLANT
WAND HAND CNTRL MULTIVAC 90 (MISCELLANEOUS) IMPLANT
WRAPON POLAR PAD SHDR XLG (MISCELLANEOUS) ×2

## 2018-05-22 NOTE — Anesthesia Procedure Notes (Signed)
Anesthesia Regional Block: Interscalene brachial plexus block   Pre-Anesthetic Checklist: ,, timeout performed, Correct Patient, Correct Site, Correct Laterality, Correct Procedure, Correct Position, site marked, Risks and benefits discussed,  Surgical consent,  Pre-op evaluation,  At surgeon's request and post-op pain management   Prep: chloraprep       Needles:  Injection technique: Single-shot  Needle Type: Echogenic Stimulator Needle     Needle Length: 5cm  Needle Gauge: 21     Additional Needles:   Procedures:, nerve stimulator,,,,,,,   Nerve Stimulator or Paresthesia:  Response: biceps flexion, 0.8 mA,   Additional Responses:   Narrative:  Injection made incrementally with aspirations every 5 mL.  Performed by: Personally  Anesthesiologist: John Lacey, MD  Additional Notes: Functioning IV was confirmed and monitors were applied.  A 50mm 22ga Arrow echogenic stimulator needle was used. Sterile prep and drape,hand hygiene and sterile gloves were used.  Negative aspiration and negative test dose prior to incremental administration of local anesthetic. The patient tolerated the procedure well.  Ultrasound guidance: relevent anatomy identified, needle position confirmed, local anesthetic spread visualized around nerve(s), vascular puncture avoided.  Image printed for medical record.       

## 2018-05-22 NOTE — Discharge Instructions (Addendum)
Wear sling at all times, including sleep.  You will need to use the sling for a total of 4 weeks following surgery.  Do not try and lift your arm up or away from your body for any reason.   Keep the dressing dry.  You may remove bandage in 3 days.  Leave the Steri-Strips (white medical tape) in place.  You may place additional Band-Aids over top of the Steri-Strips if you wish.  May shower once dressing is removed in 3 days.  Remove sling carefully only for showers, leaving arm down by your side while in the shower.  Make sure to take some pain medication this evening before you fall asleep, in preparation for the nerve block wearing off in the middle of the night.  If the the pain medication causes itching, or is too strong, try taking a single tablet at a time, or combining with Benadryl.  You may be most comfortable sleeping in a recliner.  If you do sleep in near bed, placed pillows behind the shoulder that have the operation to support it.  Take bactrim and keflex as prescribed until your post-op visit to treat the erythema in your armpit.  AMBULATORY SURGERY  DISCHARGE INSTRUCTIONS   1) The drugs that you were given will stay in your system until tomorrow so for the next 24 hours you should not:  A) Drive an automobile B) Make any legal decisions C) Drink any alcoholic beverage   2) You may resume regular meals tomorrow.  Today it is better to start with liquids and gradually work up to solid foods.  You may eat anything you prefer, but it is better to start with liquids, then soup and crackers, and gradually work up to solid foods.   3) Please notify your doctor immediately if you have any unusual bleeding, trouble breathing, redness and pain at the surgery site, drainage, fever, or pain not relieved by medication.    4) Additional Instructions:        Please contact your physician with any problems or Same Day Surgery at [phone], Monday through Friday 6 am to  4 pm, or [place] [facility] Main number at [phone].

## 2018-05-22 NOTE — Anesthesia Postprocedure Evaluation (Signed)
Anesthesia Post Note  Patient: [name]  Procedure(s) Performed: SHOULDER ARTHROSCOPY WITH OPEN ROTATOR CUFF REPAIR, DISTAL CLAVICLE EXCISION, AND SUBACROMINAL DECOMPRESSION (Left Shoulder)  Patient location during evaluation: PACU Anesthesia Type: General and Regional Level of consciousness: awake and alert Pain management: pain level controlled Vital Signs Assessment: post-procedure vital signs reviewed and stable Respiratory status: spontaneous breathing, nonlabored ventilation, respiratory function stable and patient connected to nasal cannula oxygen Cardiovascular status: blood pressure returned to baseline and stable Postop Assessment: no apparent nausea or vomiting Anesthetic complications: no     Last Vitals:  Vitals:   05/22/18 1530 05/22/18 1541  BP: 103/73 112/74  Pulse: 75 71  Resp: 19 16  Temp: (!) 36.2 C 36.5 C  SpO2: 96% 100%    Last Pain:  Vitals:   05/22/18 1541  TempSrc: Oral  PainSc: 0-No pain                 Doral Digangi S

## 2018-05-22 NOTE — Transfer of Care (Signed)
Immediate Anesthesia Transfer of Care Note  Patient: Parke Poissoneynaldo Bartow  Procedure(s) Performed: SHOULDER ARTHROSCOPY WITH OPEN ROTATOR CUFF REPAIR, DISTAL CLAVICLE EXCISION, AND SUBACROMINAL DECOMPRESSION (Left Shoulder)  Patient Location: PACU  Anesthesia Type:General  Level of Consciousness: awake and alert   Airway & Oxygen Therapy: Patient Spontanous Breathing and Patient connected to face mask oxygen  Post-op Assessment: Report given to RN and Post -op Vital signs reviewed and stable  Post vital signs: Reviewed and stable  Last Vitals:  Vitals Value Taken Time  BP 117/83 05/22/2018  2:46 PM  Temp 36.4 C 05/22/2018  2:45 PM  Pulse 99 05/22/2018  2:48 PM  Resp 25 05/22/2018  2:48 PM  SpO2 99 % 05/22/2018  2:48 PM  Vitals shown include unvalidated device data.  Last Pain:  Vitals:   05/22/18 1445  TempSrc:   PainSc: Asleep         Complications: No apparent anesthesia complications

## 2018-05-22 NOTE — H&P (Signed)
PREOPERATIVE H&P  Chief Complaint: LEFT SHOULDER FULL THICKNESS ROTATOR CUFF TEAR  HPI: John Mathis is a 57 y.o. male who presents for preoperative history and physical with a diagnosis of LEFT SHOULDER FULL THICKNESS ROTATOR CUFF TEAR. Symptoms are rated as moderate to severe, and have been worsening.  This is significantly impairing activities of daily living.  He has elected for surgical management.   Past Medical History:  Diagnosis Date  . Arthritis   . CAD (coronary artery disease)   . CHF (congestive heart failure) (HCC)   . Depression   . Diabetes mellitus without complication (HCC)   . Diverticulitis   . Hyperlipidemia   . Hypertension   . IHD (ischemic heart disease)   . Lyme disease   . Myocardial infarction (HCC)    X2   Past Surgical History:  Procedure Laterality Date  . CARDIAC CATHETERIZATION  2012   x4 stents  . CARDIAC CATHETERIZATION  2014   x6 stents  . CARDIAC CATHETERIZATION  2014   x2 stents  . HERNIA REPAIR    . JOINT REPLACEMENT     partial then full  . KNEE SURGERY Right   . SMALL INTESTINE SURGERY    . TONSILLECTOMY     Social History   Socioeconomic History  . Marital status: Married    Spouse name: Not on file  . Number of children: Not on file  . Years of education: Not on file  . Highest education level: Not on file  Occupational History  . Not on file  Social Needs  . Financial resource strain: Not on file  . Food insecurity:    Worry: Not on file    Inability: Not on file  . Transportation needs:    Medical: Not on file    Non-medical: Not on file  Tobacco Use  . Smoking status: Current Every Day Smoker    Packs/day: 0.50    Years: 30.00    Pack years: 15.00    Types: Cigarettes  . Smokeless tobacco: Never Used  Substance and Sexual Activity  . Alcohol use: No  . Drug use: No  . Sexual activity: Not on file  Lifestyle  . Physical activity:    Days per week: Not on file    Minutes per session: Not on file  .  Stress: Not on file  Relationships  . Social connections:    Talks on phone: Not on file    Gets together: Not on file    Attends religious service: Not on file    Active member of club or organization: Not on file    Attends meetings of clubs or organizations: Not on file    Relationship status: Not on file  Other Topics Concern  . Not on file  Social History Narrative  . Not on file   Family History  Problem Relation Age of Onset  . Hyperlipidemia Mother   . Other Father        parasite infection  . Mental illness Maternal Grandmother    Allergies  Allergen Reactions  . Ivp Dye [Iodinated Diagnostic Agents] Hives   Prior to Admission medications   Medication Sig Start Date End Date Taking? Authorizing Provider  aspirin EC 81 MG tablet Take 81 mg by mouth daily.   Yes [provider]  atorvastatin (LIPITOR) 80 MG tablet Take 1 tablet (80 mg total) by mouth daily. 10/02/17 05/14/19 Yes End, Cristal Deer, MD  clopidogrel (PLAVIX) 75 MG tablet Take 1 tablet (75  mg total) by mouth daily. 10/02/17  Yes End, Cristal Deerhristopher, MD  levorphanol (LEVODROMORAN) 2 MG tablet Take 2 mg by mouth 3 (three) times daily as needed for pain.   Yes [provider]  lisinopril (PRINIVIL,ZESTRIL) 5 MG tablet Take 1 tablet (5 mg total) by mouth daily. 10/02/17  Yes End, Cristal Deerhristopher, MD  metFORMIN (GLUCOPHAGE) 500 MG tablet Take 1 tablet (500 mg total) by mouth 2 (two) times daily with a meal. 01/31/18  Yes Emi BelfastGessner, Deborah B, FNP  metoprolol succinate (TOPROL-XL) 25 MG 24 hr tablet TAKE 1 TABLET BY MOUTH TWICE DAILY Patient taking differently: TAKE 2 TABLETS (50 MG) BY MOUTH ONCE DAILY 01/20/18  Yes Emi BelfastGessner, Deborah B, FNP     Positive ROS: All other systems have been reviewed and were otherwise negative with the exception of those mentioned in the HPI and as above.  Physical Exam: General: Alert, no acute distress Cardiovascular: Regular rate and rhythm, no murmurs rubs or gallops.  No pedal  edema Respiratory: Clear to auscultation bilaterally, no wheezes rales or rhonchi. No cyanosis, no use of accessory musculature GI: No organomegaly, abdomen is soft and non-tender nondistended with positive bowel sounds. Skin: Skin intact, no lesions within the operative field. Neurologic: Sensation intact distally Psychiatric: Patient is competent for consent with normal mood and affect Lymphatic: No axillary or cervical lymphadenopathy  MUSCULOSKELETAL: Right shouler: Skin is intact.  Patient had a small area of erythema in the axilla which may indicate some folliculitis.  There is no drainage tenderness or fluctuance.  Patient has pain with forward elevation and abduction to 90 degrees.  Is positive impingement signs.  He has no apprehension or instability.  He had pain with resisted abduction to the left shoulder.  He has palpable radial pulse.  He is intact sensation to light touch and intact motor function.  Assessment: RIGHT SHOULDER FULL THICKNESS ROTATOR CUFF TEAR  Plan: Plan for Procedure(s): RIGHT SHOULDER ARTHROSCOPY WITH OPEN ROTATOR CUFF REPAIR AND DISTAL CLAVICLE ACROMINECTOMY  I have discussed the details of the rotator cuff repair surgery as well as the postoperative course with the patient.  I discussed the risks and benefits of surgery.  He understands the risks include but are not limited to infection, bleeding, nerve or blood vessel injury, joint stiffness or loss of motion, persistent pain, weakness or instability,and hardware failure and the need for further surgery. Medical risks include but are not limited to DVT and pulmonary embolism, myocardial infarction, stroke, pneumonia, respiratory failure and death. Patient understood these risks and wished to proceed.   Juanell FairlyKRASINSKI, Hena Ewalt, MD   05/22/2018 11:04 AM

## 2018-05-22 NOTE — Anesthesia Post-op Follow-up Note (Signed)
Anesthesia QCDR form completed.        

## 2018-05-22 NOTE — Op Note (Addendum)
05/22/2018  3:01 PM  PATIENT:  John Mathis  57 y.o. male  PRE-OPERATIVE DIAGNOSIS:  Left shoulder FULL THICKNESS ROTATOR CUFF TEAR, subacromial impingement  POST-OPERATIVE DIAGNOSIS:   Same  PROCEDURE:  Procedure(s): LEFT SHOULDER ARTHROSCOPY WITH OPEN ROTATOR CUFF REPAIR, AND SUBACROMINAL DECOMPRESSION (Left)  SURGEON:  Surgeon(s) and Role:    Thornton Park, MD - Primary  ANESTHESIA:   local, general and paracervical block   PREOPERATIVE INDICATIONS:  John Mathis is a  57 y.o. male with a diagnosis of LEFT SHOULDER FULL THICKNESS ROTATOR CUFF TEAR failed conservative treatment and elected for surgical management.    The risks benefits and alternatives were discussed with the patient preoperatively including but not limited to the risks of infection, bleeding, nerve injury, persistent pain or weakness, failure of the hardware, re-tear of the rotator cuff and the need for further surgery. Medical risks include DVT and pulmonary embolism, myocardial infarction, stroke, pneumonia, respiratory failure and death. Patient understood these risks and wished to proceed.  OPERATIVE IMPLANTS: Smith [employer] Multifix anchor x 1 & [employer] Q Fix anchors x 1  OPERATIVE FINDINGS: Full-thickness supraspinatus tear without retraction, subacromial spurring and   OPERATIVE PROCEDURE: The patient was met in the preoperative area. The left shoulder was signed with the word yes and my initials according the hospital's correct site of surgery protocol.   History and physical was perform in the preop area.  She was found to have a small erythematous area along his thorax at the inferior aspect of his axilla which is likely folliculitis.  There is no fluctuance or drainage.  Patient is not having fevers or chills.  The decision was made to proceed with surgery as this area did not affect the operative field.  Patient underwent an interscalene block by the anesthesia service.  Patient was  brought to the operating room where he underwent general anesthesia.  The patient was placed in a beachchair position.  A spider arm positioner was used for this case.   Examination under anesthesia revealed no limitation of motion or instability with load shift testing. The patient had a negative sulcus sign.  There were no limitations to passive range of motion.  Patient was prepped and draped in a sterile fashion. A timeout was performed to verify the patient's name, date of birth, medical record number, correct site of surgery and correct procedure to be performed there was also used to verify the patient received antibiotics that all appropriate instruments, implants and radiographs studies were available in the room. Once all in attendance were in agreement case began.  Bony landmarks were drawn out with a surgical marker along with proposed arthroscopy incisions. These were pre-injected with 1% lidocaine plain. An 11 blade was used to establish a posterior portal through which the arthroscope was placed in the glenohumeral joint. A full diagnostic examination of the shoulder was performed.  Patient was found to have a full-thickness tear involving the supraspinatus. An 18-gauge spinal needle was used to place a 0 PDS suture through the rotator cuff tear for identification from the bursal side.  The arthroscope was then placed in the subacromial space.  0 PDS suture was identified.  Extensive bursitis was encountered and debrided using a 4-0 resector shaver blade and a 90 ArthroCare wand from a lateral portal which was established under direct visualization using an 18-gauge spinal needle. A subacromial decompression was also performed using a 5.0 mm resector shaver blade from the lateral portal.  A 5.0  mm resector shaver blade was then used to debride the greater tuberosity of all torn fibers of the rotator cuff.  The partial-thickness tear was then completed with a 4.0 resector shaver blade.  Two  Perfect Pass sutures were placed in the lateral border of the rotator cuff tear. All arthroscopic instruments were then removed and the mini-open portion of the procedure began.  A saber-type incision was made along the lateral border of the acromion. The deltoid muscle was identified and split in line with its fibers which allowed visualization of the rotator cuff. The Perfect Pass sutures previously placed in the lateral border of the rotator cuff were brought out through the deltoid split.    A single [employer] anchor was placed at the articular margin of the humeral head with the greater tuberosity. The suture limbs of the Q Fix anchor were passed medially through the rotator cuff using a First Pass suture passer.   The two Perfect Pass sutures were then anchored to the greater tuberosity footprint using a [employer] multi fix anchor. These anchors were tensioned to allow for anatomic reduction of the rotator cuff to the greater tuberosity. The medial row sutures were then tied down using an arthroscopic knot tying technique.  Arthroscopic images of the repair were taken with the arthroscope both externally and from inside the glenohumeral joint.  All incisions were copiously irrigated. The deltoid fascia was repaired using a 0 Vicryl suture.  The subcutaneous tissue of all incisions were closed with a 2-0 Vicryl. Skin closure for the arthroscopic incisions was performed with 4-0 nylon. The skin edges of the saber incision was approximated with a running 4-0 undyed Monocryl.  0.25% Marcaine plain was then injected into the subacromial space for postoperative pain control. A dry sterile dressing was applied.  The patient was placed in an abduction sling and a Polar Care was applied to the shoulder.  All sharp and it instrument counts were correct at the conclusion of the case. I was scrubbed and present for the entire case. I spoke with the patient's sister postoperatively by phone to let  her know the case had gone without complication and the patient was stable in recovery room.

## 2018-05-22 NOTE — Progress Notes (Signed)
Uncharted IV removed from right hand. 

## 2018-05-22 NOTE — Anesthesia Preprocedure Evaluation (Signed)
Anesthesia Evaluation  Patient identified by MRN, date of birth, ID band Patient awake    Reviewed: Allergy & Precautions, NPO status , Patient's Chart, lab work & pertinent test results, reviewed documented beta blocker date and time   Airway Mallampati: II  TM Distance: >3 FB     Dental  (+) Chipped   Pulmonary Current Smoker,           Cardiovascular hypertension, Pt. on medications and Pt. on home beta blockers + CAD, + Past MI and +CHF       Neuro/Psych PSYCHIATRIC DISORDERS Depression    GI/Hepatic   Endo/Other  diabetes, Type 2  Renal/GU      Musculoskeletal  (+) Arthritis ,   Abdominal   Peds  Hematology   Anesthesia Other Findings Echo ok and ekg ok. Smokes.  Reproductive/Obstetrics                             Anesthesia Physical Anesthesia Plan  ASA: III  Anesthesia Plan: General   Post-op Pain Management:    Induction: Intravenous  PONV Risk Score and Plan:   Airway Management Planned: Oral ETT  Additional Equipment:   Intra-op Plan:   Post-operative Plan:   Informed Consent: I have reviewed the patients History and Physical, chart, labs and discussed the procedure including the risks, benefits and alternatives for the proposed anesthesia with the patient or authorized representative who has indicated his/her understanding and acceptance.     Plan Discussed with: CRNA  Anesthesia Plan Comments:         Anesthesia Quick Evaluation

## 2018-05-22 NOTE — Anesthesia Procedure Notes (Signed)
Procedure Name: Intubation Date/Time: 05/22/2018 11:35 AM Performed by: Ginger CarneMichelet, Karista Aispuro, CRNA Pre-anesthesia Checklist: Patient identified, Emergency Drugs available, Suction available, Patient being monitored and Timeout performed Patient Re-evaluated:Patient Re-evaluated prior to induction Oxygen Delivery Method: Circle system utilized Preoxygenation: Pre-oxygenation with 100% oxygen Induction Type: IV induction Ventilation: Mask ventilation without difficulty and Oral airway inserted - appropriate to patient size Laryngoscope Size: Hyacinth MeekerMiller, 2, McGraph and 3 Grade View: Grade I Tube type: Oral Tube size: 7.5 mm Number of attempts: 3 Airway Equipment and Method: Stylet,  Video-laryngoscopy,  Bougie stylet and Oral airway Placement Confirmation: ETT inserted through vocal cords under direct vision,  positive ETCO2 and breath sounds checked- equal and bilateral Secured at: 23 cm Tube secured with: Tape Dental Injury: Teeth and Oropharynx as per pre-operative assessment  Difficulty Due To: Difficulty was unanticipated, Difficult Airway- due to anterior larynx and Difficult Airway- due to limited oral opening Future Recommendations: Recommend- induction with short-acting agent, and alternative techniques readily available

## 2018-05-23 ENCOUNTER — Encounter: Payer: Self-pay | Admitting: Orthopedic Surgery

## 2018-06-01 DIAGNOSIS — M25561 Pain in right knee: Secondary | ICD-10-CM | POA: Diagnosis not present

## 2018-06-01 DIAGNOSIS — I2510 Atherosclerotic heart disease of native coronary artery without angina pectoris: Secondary | ICD-10-CM | POA: Diagnosis not present

## 2018-06-01 DIAGNOSIS — R69 Illness, unspecified: Secondary | ICD-10-CM | POA: Diagnosis not present

## 2018-06-01 DIAGNOSIS — I10 Essential (primary) hypertension: Secondary | ICD-10-CM | POA: Diagnosis not present

## 2018-06-01 DIAGNOSIS — E119 Type 2 diabetes mellitus without complications: Secondary | ICD-10-CM | POA: Diagnosis not present

## 2018-06-01 DIAGNOSIS — E663 Overweight: Secondary | ICD-10-CM | POA: Diagnosis not present

## 2018-06-01 DIAGNOSIS — I252 Old myocardial infarction: Secondary | ICD-10-CM | POA: Diagnosis not present

## 2018-06-01 DIAGNOSIS — Z720 Tobacco use: Secondary | ICD-10-CM | POA: Diagnosis not present

## 2018-06-01 DIAGNOSIS — E785 Hyperlipidemia, unspecified: Secondary | ICD-10-CM | POA: Diagnosis not present

## 2018-06-01 DIAGNOSIS — Z6826 Body mass index (BMI) 26.0-26.9, adult: Secondary | ICD-10-CM | POA: Diagnosis not present

## 2018-06-02 DIAGNOSIS — M25612 Stiffness of left shoulder, not elsewhere classified: Secondary | ICD-10-CM | POA: Diagnosis not present

## 2018-06-02 DIAGNOSIS — M25512 Pain in left shoulder: Secondary | ICD-10-CM | POA: Diagnosis not present

## 2018-06-02 DIAGNOSIS — R600 Localized edema: Secondary | ICD-10-CM | POA: Diagnosis not present

## 2018-06-05 DIAGNOSIS — M25612 Stiffness of left shoulder, not elsewhere classified: Secondary | ICD-10-CM | POA: Diagnosis not present

## 2018-06-05 DIAGNOSIS — M25512 Pain in left shoulder: Secondary | ICD-10-CM | POA: Diagnosis not present

## 2018-06-05 DIAGNOSIS — R600 Localized edema: Secondary | ICD-10-CM | POA: Diagnosis not present

## 2018-06-09 DIAGNOSIS — M25612 Stiffness of left shoulder, not elsewhere classified: Secondary | ICD-10-CM | POA: Diagnosis not present

## 2018-06-09 DIAGNOSIS — M25512 Pain in left shoulder: Secondary | ICD-10-CM | POA: Diagnosis not present

## 2018-06-12 DIAGNOSIS — M25612 Stiffness of left shoulder, not elsewhere classified: Secondary | ICD-10-CM | POA: Diagnosis not present

## 2018-06-12 DIAGNOSIS — M25512 Pain in left shoulder: Secondary | ICD-10-CM | POA: Diagnosis not present

## 2018-06-17 DIAGNOSIS — M25612 Stiffness of left shoulder, not elsewhere classified: Secondary | ICD-10-CM | POA: Diagnosis not present

## 2018-06-17 DIAGNOSIS — M25512 Pain in left shoulder: Secondary | ICD-10-CM | POA: Diagnosis not present

## 2018-06-18 DIAGNOSIS — R69 Illness, unspecified: Secondary | ICD-10-CM | POA: Diagnosis not present

## 2018-06-19 DIAGNOSIS — M25612 Stiffness of left shoulder, not elsewhere classified: Secondary | ICD-10-CM | POA: Diagnosis not present

## 2018-06-19 DIAGNOSIS — M25512 Pain in left shoulder: Secondary | ICD-10-CM | POA: Diagnosis not present

## 2018-06-25 DIAGNOSIS — M25612 Stiffness of left shoulder, not elsewhere classified: Secondary | ICD-10-CM | POA: Diagnosis not present

## 2018-06-25 DIAGNOSIS — R69 Illness, unspecified: Secondary | ICD-10-CM | POA: Diagnosis not present

## 2018-06-25 DIAGNOSIS — M25512 Pain in left shoulder: Secondary | ICD-10-CM | POA: Diagnosis not present

## 2018-06-27 DIAGNOSIS — M25612 Stiffness of left shoulder, not elsewhere classified: Secondary | ICD-10-CM | POA: Diagnosis not present

## 2018-06-27 DIAGNOSIS — M25512 Pain in left shoulder: Secondary | ICD-10-CM | POA: Diagnosis not present

## 2018-07-02 DIAGNOSIS — R69 Illness, unspecified: Secondary | ICD-10-CM | POA: Diagnosis not present

## 2018-07-04 DIAGNOSIS — M25512 Pain in left shoulder: Secondary | ICD-10-CM | POA: Diagnosis not present

## 2018-07-04 DIAGNOSIS — M25612 Stiffness of left shoulder, not elsewhere classified: Secondary | ICD-10-CM | POA: Diagnosis not present

## 2018-07-07 DIAGNOSIS — M25612 Stiffness of left shoulder, not elsewhere classified: Secondary | ICD-10-CM | POA: Diagnosis not present

## 2018-07-07 DIAGNOSIS — M25512 Pain in left shoulder: Secondary | ICD-10-CM | POA: Diagnosis not present

## 2018-07-09 DIAGNOSIS — M25612 Stiffness of left shoulder, not elsewhere classified: Secondary | ICD-10-CM | POA: Diagnosis not present

## 2018-07-09 DIAGNOSIS — M25512 Pain in left shoulder: Secondary | ICD-10-CM | POA: Diagnosis not present

## 2018-07-13 ENCOUNTER — Other Ambulatory Visit: Payer: Self-pay | Admitting: Family Medicine

## 2018-07-14 DIAGNOSIS — M25612 Stiffness of left shoulder, not elsewhere classified: Secondary | ICD-10-CM | POA: Diagnosis not present

## 2018-07-14 DIAGNOSIS — M25512 Pain in left shoulder: Secondary | ICD-10-CM | POA: Diagnosis not present

## 2018-07-16 DIAGNOSIS — R69 Illness, unspecified: Secondary | ICD-10-CM | POA: Diagnosis not present

## 2018-07-17 DIAGNOSIS — M25612 Stiffness of left shoulder, not elsewhere classified: Secondary | ICD-10-CM | POA: Diagnosis not present

## 2018-07-17 DIAGNOSIS — M25512 Pain in left shoulder: Secondary | ICD-10-CM | POA: Diagnosis not present

## 2018-07-21 DIAGNOSIS — M25612 Stiffness of left shoulder, not elsewhere classified: Secondary | ICD-10-CM | POA: Diagnosis not present

## 2018-07-21 DIAGNOSIS — M25512 Pain in left shoulder: Secondary | ICD-10-CM | POA: Diagnosis not present

## 2018-07-23 DIAGNOSIS — M25612 Stiffness of left shoulder, not elsewhere classified: Secondary | ICD-10-CM | POA: Diagnosis not present

## 2018-07-23 DIAGNOSIS — R69 Illness, unspecified: Secondary | ICD-10-CM | POA: Diagnosis not present

## 2018-07-23 DIAGNOSIS — M25512 Pain in left shoulder: Secondary | ICD-10-CM | POA: Diagnosis not present

## 2018-07-28 DIAGNOSIS — R600 Localized edema: Secondary | ICD-10-CM | POA: Diagnosis not present

## 2018-07-28 DIAGNOSIS — M25612 Stiffness of left shoulder, not elsewhere classified: Secondary | ICD-10-CM | POA: Diagnosis not present

## 2018-07-28 DIAGNOSIS — M25512 Pain in left shoulder: Secondary | ICD-10-CM | POA: Diagnosis not present

## 2018-07-30 DIAGNOSIS — M25612 Stiffness of left shoulder, not elsewhere classified: Secondary | ICD-10-CM | POA: Diagnosis not present

## 2018-07-30 DIAGNOSIS — M25512 Pain in left shoulder: Secondary | ICD-10-CM | POA: Diagnosis not present

## 2018-08-04 DIAGNOSIS — M25612 Stiffness of left shoulder, not elsewhere classified: Secondary | ICD-10-CM | POA: Diagnosis not present

## 2018-08-04 DIAGNOSIS — M25512 Pain in left shoulder: Secondary | ICD-10-CM | POA: Diagnosis not present

## 2018-08-04 DIAGNOSIS — R600 Localized edema: Secondary | ICD-10-CM | POA: Diagnosis not present

## 2018-08-06 DIAGNOSIS — R69 Illness, unspecified: Secondary | ICD-10-CM | POA: Diagnosis not present

## 2018-08-07 DIAGNOSIS — R600 Localized edema: Secondary | ICD-10-CM | POA: Diagnosis not present

## 2018-08-07 DIAGNOSIS — M25512 Pain in left shoulder: Secondary | ICD-10-CM | POA: Diagnosis not present

## 2018-08-07 DIAGNOSIS — M25612 Stiffness of left shoulder, not elsewhere classified: Secondary | ICD-10-CM | POA: Diagnosis not present

## 2018-08-11 DIAGNOSIS — M25612 Stiffness of left shoulder, not elsewhere classified: Secondary | ICD-10-CM | POA: Diagnosis not present

## 2018-08-11 DIAGNOSIS — R600 Localized edema: Secondary | ICD-10-CM | POA: Diagnosis not present

## 2018-08-11 DIAGNOSIS — M25512 Pain in left shoulder: Secondary | ICD-10-CM | POA: Diagnosis not present

## 2018-08-14 DIAGNOSIS — M25612 Stiffness of left shoulder, not elsewhere classified: Secondary | ICD-10-CM | POA: Diagnosis not present

## 2018-08-14 DIAGNOSIS — R600 Localized edema: Secondary | ICD-10-CM | POA: Diagnosis not present

## 2018-08-14 DIAGNOSIS — M25512 Pain in left shoulder: Secondary | ICD-10-CM | POA: Diagnosis not present

## 2018-08-18 DIAGNOSIS — M25512 Pain in left shoulder: Secondary | ICD-10-CM | POA: Diagnosis not present

## 2018-08-18 DIAGNOSIS — M25612 Stiffness of left shoulder, not elsewhere classified: Secondary | ICD-10-CM | POA: Diagnosis not present

## 2018-08-20 DIAGNOSIS — M25612 Stiffness of left shoulder, not elsewhere classified: Secondary | ICD-10-CM | POA: Diagnosis not present

## 2018-08-20 DIAGNOSIS — R600 Localized edema: Secondary | ICD-10-CM | POA: Diagnosis not present

## 2018-08-20 DIAGNOSIS — M25512 Pain in left shoulder: Secondary | ICD-10-CM | POA: Diagnosis not present

## 2018-08-26 DIAGNOSIS — M25512 Pain in left shoulder: Secondary | ICD-10-CM | POA: Diagnosis not present

## 2018-08-26 DIAGNOSIS — M25612 Stiffness of left shoulder, not elsewhere classified: Secondary | ICD-10-CM | POA: Diagnosis not present

## 2018-08-26 DIAGNOSIS — R600 Localized edema: Secondary | ICD-10-CM | POA: Diagnosis not present

## 2018-08-28 DIAGNOSIS — M25512 Pain in left shoulder: Secondary | ICD-10-CM | POA: Diagnosis not present

## 2018-08-28 DIAGNOSIS — M25612 Stiffness of left shoulder, not elsewhere classified: Secondary | ICD-10-CM | POA: Diagnosis not present

## 2018-09-01 DIAGNOSIS — M25612 Stiffness of left shoulder, not elsewhere classified: Secondary | ICD-10-CM | POA: Diagnosis not present

## 2018-09-01 DIAGNOSIS — M25512 Pain in left shoulder: Secondary | ICD-10-CM | POA: Diagnosis not present

## 2018-09-03 DIAGNOSIS — M25512 Pain in left shoulder: Secondary | ICD-10-CM | POA: Diagnosis not present

## 2018-09-03 DIAGNOSIS — M25612 Stiffness of left shoulder, not elsewhere classified: Secondary | ICD-10-CM | POA: Diagnosis not present

## 2018-09-08 DIAGNOSIS — M25512 Pain in left shoulder: Secondary | ICD-10-CM | POA: Diagnosis not present

## 2018-09-08 DIAGNOSIS — M25612 Stiffness of left shoulder, not elsewhere classified: Secondary | ICD-10-CM | POA: Diagnosis not present

## 2018-09-10 DIAGNOSIS — M25512 Pain in left shoulder: Secondary | ICD-10-CM | POA: Diagnosis not present

## 2018-09-10 DIAGNOSIS — M25612 Stiffness of left shoulder, not elsewhere classified: Secondary | ICD-10-CM | POA: Diagnosis not present

## 2018-09-15 DIAGNOSIS — M25512 Pain in left shoulder: Secondary | ICD-10-CM | POA: Diagnosis not present

## 2018-09-15 DIAGNOSIS — M25612 Stiffness of left shoulder, not elsewhere classified: Secondary | ICD-10-CM | POA: Diagnosis not present

## 2018-09-17 DIAGNOSIS — M25512 Pain in left shoulder: Secondary | ICD-10-CM | POA: Diagnosis not present

## 2018-09-17 DIAGNOSIS — M25612 Stiffness of left shoulder, not elsewhere classified: Secondary | ICD-10-CM | POA: Diagnosis not present

## 2018-09-22 DIAGNOSIS — M25512 Pain in left shoulder: Secondary | ICD-10-CM | POA: Diagnosis not present

## 2018-09-22 DIAGNOSIS — M25612 Stiffness of left shoulder, not elsewhere classified: Secondary | ICD-10-CM | POA: Diagnosis not present

## 2018-09-24 DIAGNOSIS — Z9889 Other specified postprocedural states: Secondary | ICD-10-CM | POA: Diagnosis not present

## 2018-10-02 DIAGNOSIS — M25512 Pain in left shoulder: Secondary | ICD-10-CM | POA: Diagnosis not present

## 2018-10-02 DIAGNOSIS — M25612 Stiffness of left shoulder, not elsewhere classified: Secondary | ICD-10-CM | POA: Diagnosis not present

## 2018-10-06 DIAGNOSIS — M25612 Stiffness of left shoulder, not elsewhere classified: Secondary | ICD-10-CM | POA: Diagnosis not present

## 2018-10-06 DIAGNOSIS — M25512 Pain in left shoulder: Secondary | ICD-10-CM | POA: Diagnosis not present

## 2018-10-08 DIAGNOSIS — M25512 Pain in left shoulder: Secondary | ICD-10-CM | POA: Diagnosis not present

## 2018-10-08 DIAGNOSIS — M25612 Stiffness of left shoulder, not elsewhere classified: Secondary | ICD-10-CM | POA: Diagnosis not present

## 2018-10-31 ENCOUNTER — Telehealth: Payer: Self-pay | Admitting: Internal Medicine

## 2018-10-31 ENCOUNTER — Ambulatory Visit: Payer: Medicare HMO | Admitting: Internal Medicine

## 2018-10-31 ENCOUNTER — Encounter: Payer: Self-pay | Admitting: Internal Medicine

## 2018-10-31 ENCOUNTER — Other Ambulatory Visit: Payer: Self-pay

## 2018-10-31 VITALS — BP 106/70 | HR 69 | Ht 71.0 in | Wt 193.5 lb

## 2018-10-31 DIAGNOSIS — I2510 Atherosclerotic heart disease of native coronary artery without angina pectoris: Secondary | ICD-10-CM

## 2018-10-31 DIAGNOSIS — I255 Ischemic cardiomyopathy: Secondary | ICD-10-CM | POA: Diagnosis not present

## 2018-10-31 DIAGNOSIS — E785 Hyperlipidemia, unspecified: Secondary | ICD-10-CM | POA: Diagnosis not present

## 2018-10-31 DIAGNOSIS — Z720 Tobacco use: Secondary | ICD-10-CM | POA: Diagnosis not present

## 2018-10-31 MED ORDER — CLOPIDOGREL BISULFATE 75 MG PO TABS: 75.0000 mg | ORAL_TABLET | Freq: Every day | ORAL | 0 refills | Status: DC

## 2018-10-31 MED ORDER — LISINOPRIL 5 MG PO TABS: 5.0000 mg | ORAL_TABLET | Freq: Every day | ORAL | 0 refills | Status: DC

## 2018-10-31 MED ORDER — ATORVASTATIN CALCIUM 80 MG PO TABS: 80.0000 mg | ORAL_TABLET | Freq: Every day | ORAL | 0 refills | Status: DC

## 2018-10-31 MED ORDER — VARENICLINE TARTRATE 0.5 MG X 11 & 1 MG X 42 PO MISC: ORAL | 0 refills | Status: DC

## 2018-10-31 NOTE — Telephone Encounter (Signed)
Refills sent for a 30 day supply.

## 2018-10-31 NOTE — Patient Instructions (Signed)
Medication Instructions:  START Chantix:  Take one 0.5 mg tablet by mouth once daily for 3 days, then increase to one 0.5 mg tablet twice daily for 4 days, then increase to one 1 mg tablet twice daily.  After the starter pack, you will then do a monthly pack of 1 mg tablet twice daily.    If you need a refill on your cardiac medications before your next appointment, please call your pharmacy.   Lab work: Your provider would like for you to return in one week to have the following labs drawn: Fasting Lipid. Please go to the [facility] entrance and check in at the front desk. You do not need an appointment.   Testing/Procedures: None ordered  Follow-Up: At [facility], you and your health needs are our priority.  As part of our continuing mission to provide you with exceptional heart care, we have created designated Provider Care Teams.  These Care Teams include your primary Cardiologist (physician) and Advanced Practice Providers (APPs -  Physician Assistants and Nurse Practitioners) who all work together to provide you with the care you need, when you need it. You will need a follow up appointment in 6 months.  Please call our office 2 months in advance to schedule this appointment.  You may see Dr. Okey Dupre or one of the following Advanced Practice Providers on your designated Care Team:   Nicolasa Ducking, NP Eula Listen, PA-C . Marisue Ivan, PA-C

## 2018-10-31 NOTE — Progress Notes (Signed)
Follow-up Outpatient Visit Date: 10/31/2018  Primary Care Provider: [name], FNP [address] [place] [address]  Chief Complaint: Follow-up coronary artery disease  HPI:  Mr. Ruppe is a 58 y.o. year-old male with history of CADwith report of 31 prior stent placements while living in [place], ischemic cardiomyopathy with LVEF of 30-35% by echo in 2017 but found to be 50-55% in 10/2017, hypertension, hyperlipidemia, diabetes mellitus, and Lyme disease, who presents for follow-up of coronary artery disease and cardiomyopathy.  I last saw him in July, at which time he was doing well.  We did not make any medication changes at that time.  Today, Mr. Vinyard reports that he continues to do well.  He has not had any chest pain, shortness of breath, palpitations, lightheadedness, or edema.  He is tolerating his current medications well.  He continues to smoke but is interested in quitting.  He has used Chantix in the past with some success, but feels like stress and anxiety at that time prevented him from stopping altogether.  He has been exercising less than in the past, as he continues to recover from shoulder surgery in July.  --------------------------------------------------------------------------------------------------  Cardiovascular History & Procedures: Cardiovascular Problems:  Coronary artery disease status post multiple PCI's  Chronic systolic heart failure secondary to ischemiccardiomyopathy  Risk Factors:  Known coronary artery disease, hypertension, hyperlipidemia, diabetes mellitus, male gender, and age greater than 2  Cath/PCI:  None available. Patient reports a total of 14 stents from 2012 through 2014  CV Surgery:  None  EP Procedures and Devices:  None  Non-Invasive Evaluation(s):  TTE (11/18/17): Mildly dilated LV with normal wall thickness.  LVEF 50-55% with normal wall motion and diastolic function.  Normal RV size and  function.  [facility] [place]):Normal LV size. LVEF 30-35% with inferior hypokinesis. Trace aortic and mild mitral regurgitation. Trace to mild tricuspid regurgitation. Normal RV size and function.  Recent CV Pertinent Labs: Lab Results  Component Value Date   CHOL 154 06/14/2017   HDL 34 (L) 06/14/2017   LDLCALC 85 06/14/2017   TRIG 175 (H) 06/14/2017   CHOLHDL 4.5 06/14/2017   INR 1.00 05/19/2018   K 4.0 05/19/2018   BUN 12 05/19/2018   BUN 13 06/14/2017   CREATININE 0.76 05/19/2018    Past medical and surgical history were reviewed and updated in EPIC.  Current Meds  Medication Sig  . aspirin EC 81 MG tablet Take 81 mg by mouth daily.  Marland Kitchen atorvastatin (LIPITOR) 80 MG tablet Take 1 tablet (80 mg total) by mouth daily.  . clopidogrel (PLAVIX) 75 MG tablet Take 1 tablet (75 mg total) by mouth daily.  Marland Kitchen lisinopril (PRINIVIL,ZESTRIL) 5 MG tablet Take 1 tablet (5 mg total) by mouth daily.  . metFORMIN (GLUCOPHAGE) 500 MG tablet Take 1 tablet (500 mg total) by mouth 2 (two) times daily with a meal.  . metoprolol succinate (TOPROL-XL) 25 MG 24 hr tablet TAKE 1 TABLET BY MOUTH TWICE A DAY    Allergies: Ivp dye [iodinated diagnostic agents]  Social History   Tobacco Use  . Smoking status: Current Every Day Smoker    Packs/day: 0.50    Years: 30.00    Pack years: 15.00    Types: Cigarettes  . Smokeless tobacco: Never Used  Substance Use Topics  . Alcohol use: No  . Drug use: No    Family History  Problem Relation Age of Onset  . Hyperlipidemia Mother   .  Other Father        parasite infection  . Mental illness Maternal Grandmother     Review of Systems: A 12-system review of systems was performed and was negative except as noted in the HPI.  --------------------------------------------------------------------------------------------------  Physical Exam: BP 106/70 (BP Location: Left Arm, Patient Position: Sitting,  Cuff Size: Normal)   Pulse 69   Ht 5\' 11"  (1.803 m)   Wt 193 lb 8 oz (87.8 kg)   BMI 26.99 kg/m   General: NAD. HEENT: No conjunctival pallor or scleral icterus. Moist mucous membranes.  OP clear. Neck: Supple without lymphadenopathy, thyromegaly, JVD, or HJR.  No carotid bruit. Lungs: Normal work of breathing. Clear to auscultation bilaterally without wheezes or crackles. Heart: Regular rate and rhythm without murmurs, rubs, or gallops. Non-displaced PMI. Abd: Bowel sounds present. Soft, NT/ND without hepatosplenomegaly Ext: No lower extremity edema. Skin: Warm and dry without rash.  EKG: Normal sinus rhythm with possible inferior infarct.  No significant change from prior tracing.  Lab Results  Component Value Date   WBC 7.7 05/19/2018   HGB 17.2 05/19/2018   HCT 48.0 05/19/2018   MCV 89.5 05/19/2018   PLT 247 05/19/2018    Lab Results  Component Value Date   NA 140 05/19/2018   K 4.0 05/19/2018   CL 105 05/19/2018   CO2 28 05/19/2018   BUN 12 05/19/2018   CREATININE 0.76 05/19/2018   GLUCOSE 133 (H) 05/19/2018   ALT 17 02/17/2018    Lab Results  Component Value Date   CHOL 154 06/14/2017   HDL 34 (L) 06/14/2017   LDLCALC 85 06/14/2017   TRIG 175 (H) 06/14/2017   CHOLHDL 4.5 06/14/2017    --------------------------------------------------------------------------------------------------  ASSESSMENT AND PLAN: Coronary artery disease No symptoms suggest worsening coronary insufficiency.  Continue current medications for secondary prevention, including indefinite dual antiplatelet therapy given multiple PCI's.  Ischemic cardiomyopathy Mr. Carrejo appears euvolemic and well compensated with NYHA class I heart failure symptoms.  LVEF low normal to mildly decreased on most recent echo a year ago.  Continue current medications.  Hyperlipidemia LDL slightly above goal on last check.  No repeat lipid panel since our last visit.  I have asked him to have a fasting lipid  panel done at his convenience.  For now, we will continue atorvastatin 80 mg daily.  Tobacco abuse Counseling included reviewing the risks of smoking tobacco products, how it impacts the patient's current medical diagnoses and different strategies for quitting.  Pharmacotherapy to aid in tobacco cessation was prescribed today.  We will begin Chantix; risks and benefits of the medication were discussed.  Yvonne Kendall, MD 10/31/2018 2:00 PM

## 2018-10-31 NOTE — Telephone Encounter (Signed)
°*  STAT* If patient is at the pharmacy, call can be transferred to refill team.   1. Which medications need to be refilled? (please list name of each medication and dose if known)  Clopidogrel 75 MG 1 tablet daily Atorvastatin 80 MG 1 tablet daily Lisinopril 5 MG 1 tablet daily   2. Which pharmacy/location (including street and city if local pharmacy) is medication to be sent to? [facility] in [place]  3. Do they need a 30 day or 90 day supply? 30 day   Patient scheduled to see Dr End today at 2:00pm

## 2018-11-12 ENCOUNTER — Other Ambulatory Visit: Payer: Self-pay | Admitting: *Deleted

## 2018-11-12 MED ORDER — VARENICLINE TARTRATE 1 MG PO TABS: 1.0000 mg | ORAL_TABLET | Freq: Two times a day (BID) | ORAL | 1 refills | Status: DC

## 2018-11-14 IMAGING — DX DG KNEE COMPLETE 4+V*L*
5 series · 5 of 5 positions shown · non-contrast
Comparison: None.

CLINICAL DATA: 56-year-old male with left knee pain. No known
injury.

EXAM:
LEFT KNEE - COMPLETE 4+ VIEW

[knee ap]
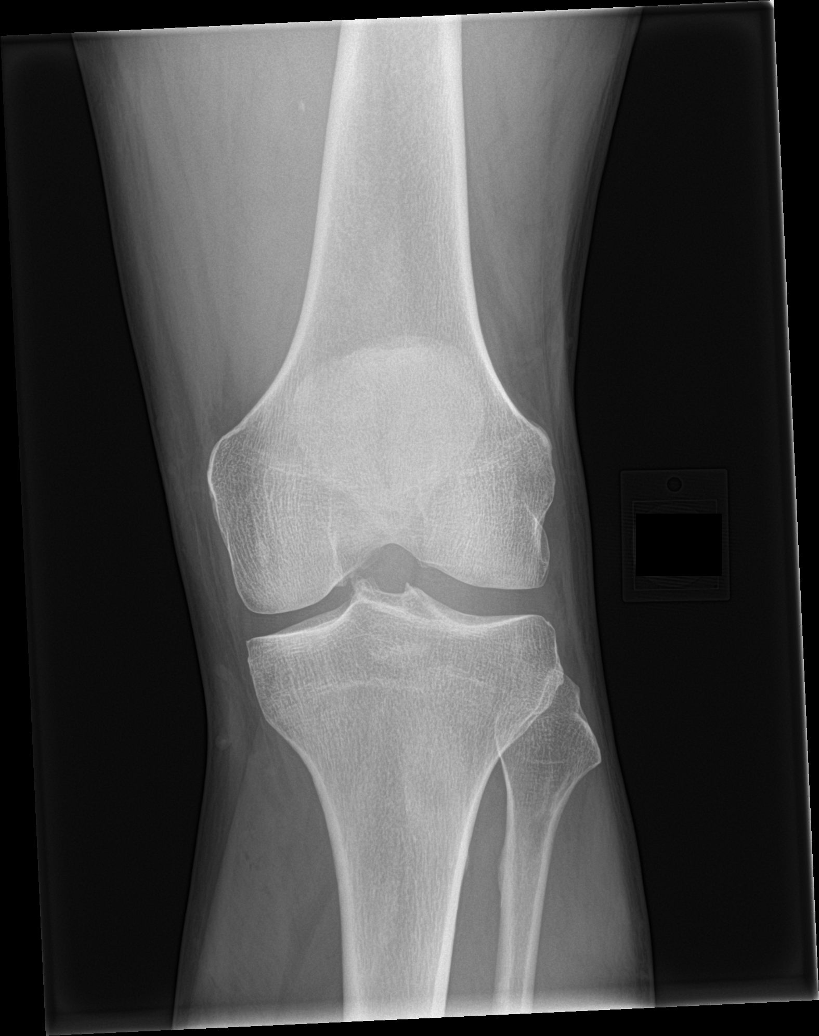

[knee obl (1 of 2)]
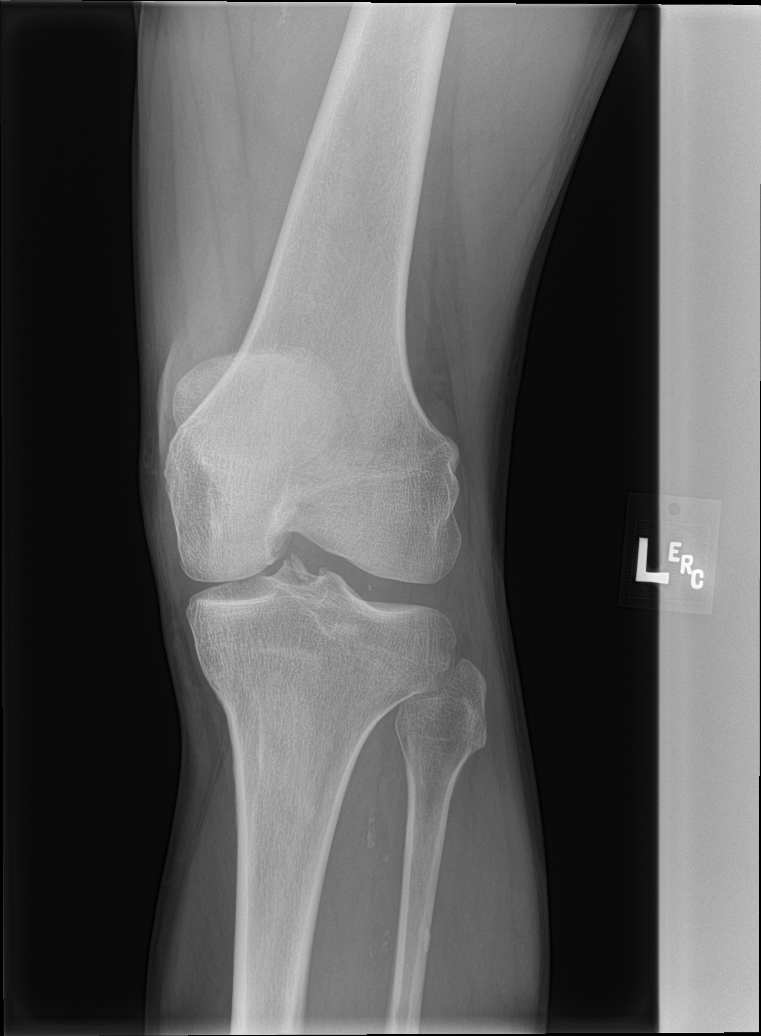

[knee obl (2 of 2)]
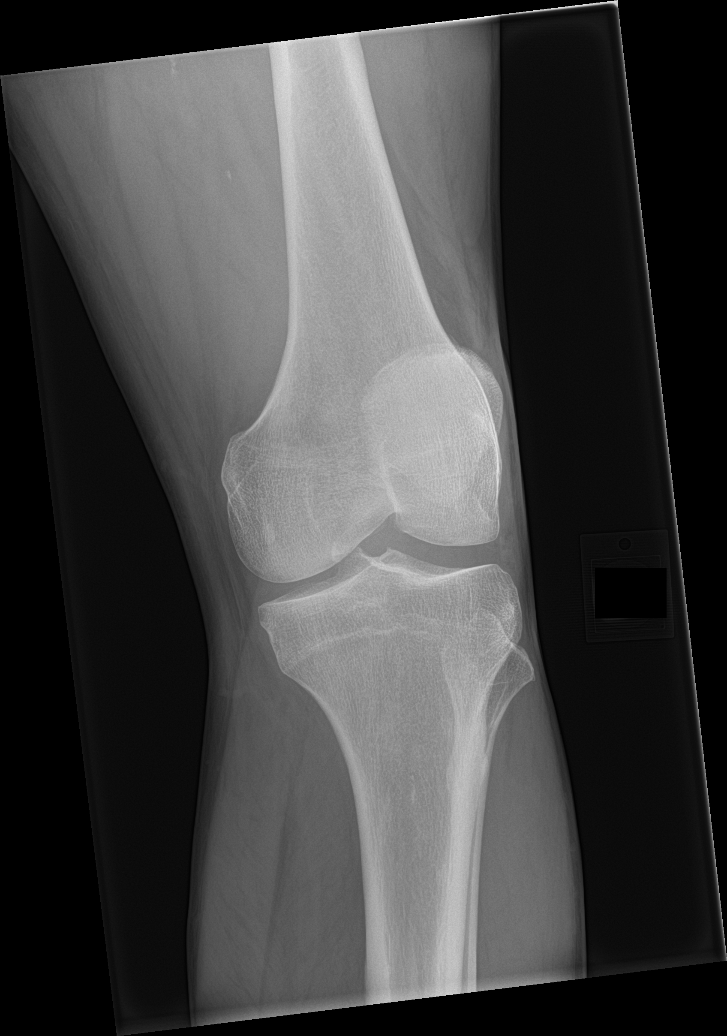

[knee lat (1 of 2)]
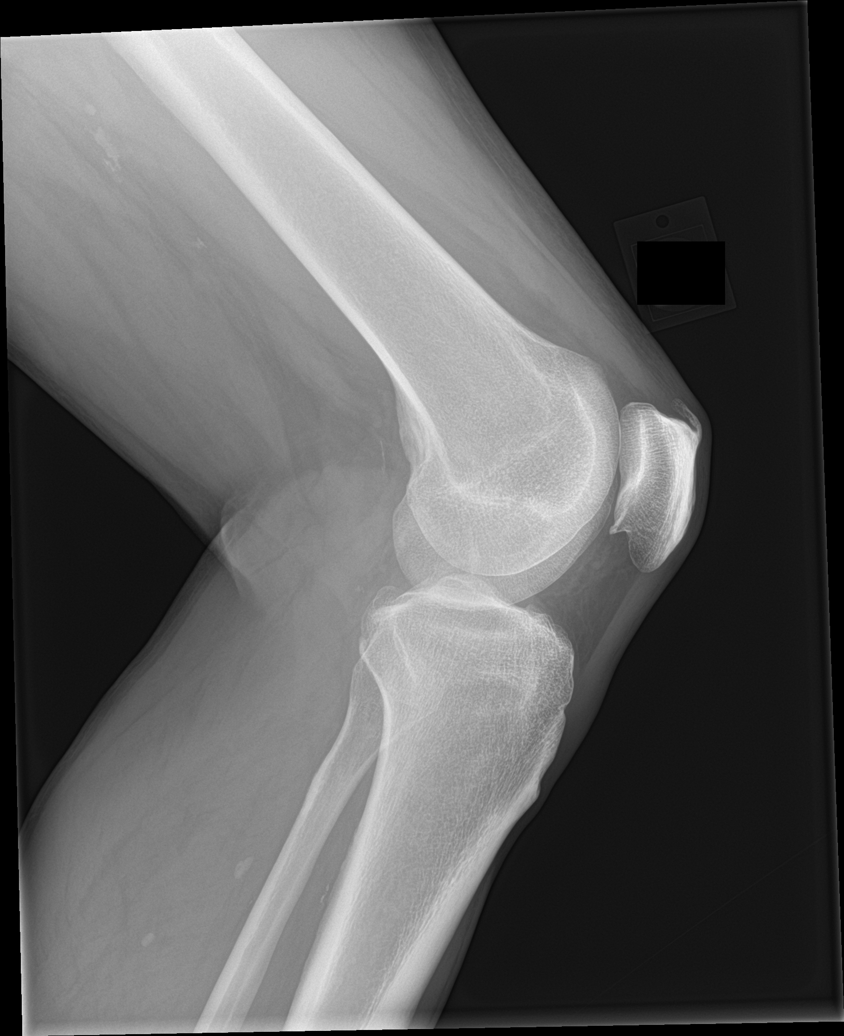

[knee lat (2 of 2)]
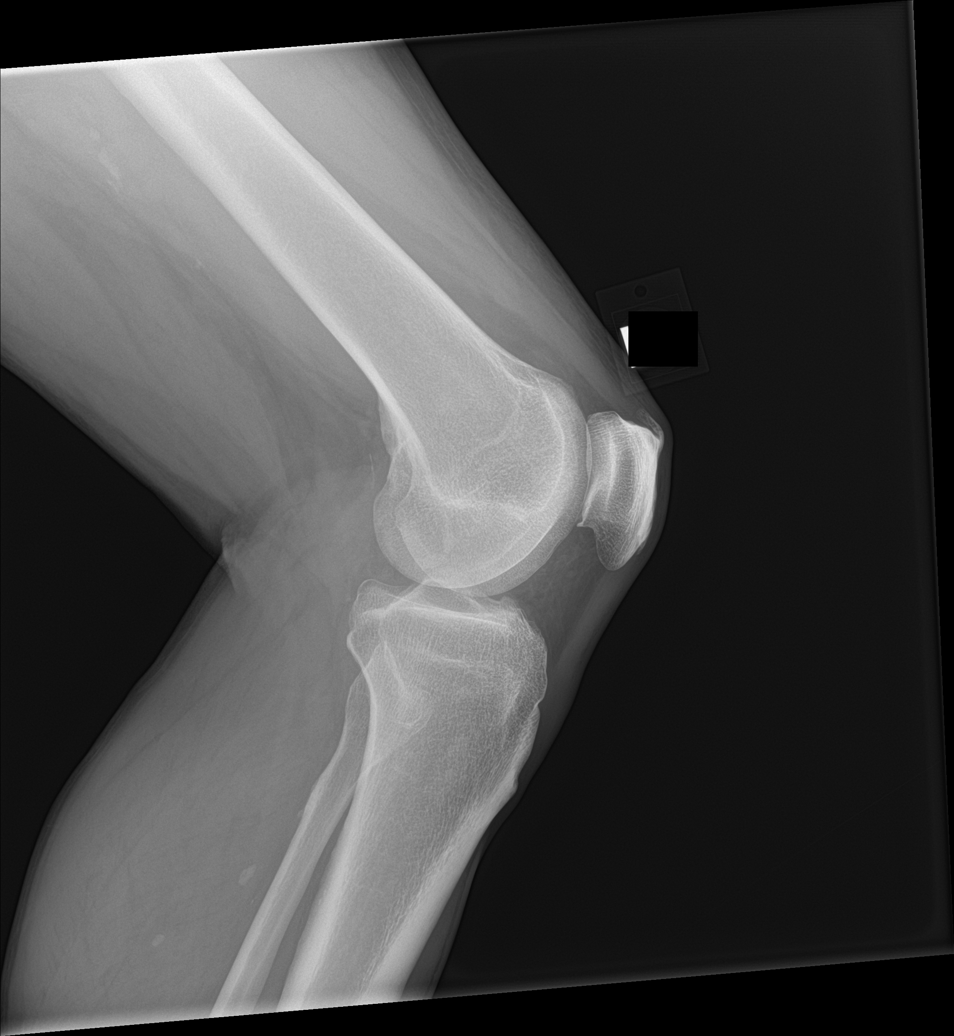

[5 of 5 positions shown; findings below may reference images not displayed]

FINDINGS: No acute fracture or dislocation. The bones are well mineralized.
Minimal narrowing of the medial compartment. A 1 cm bone spurring at
the attachment of the quadriceps on the patella. No joint effusion.
The soft tissues appear unremarkable.
IMPRESSION: Negative.

## 2018-11-21 ENCOUNTER — Other Ambulatory Visit: Payer: Self-pay

## 2018-11-21 ENCOUNTER — Other Ambulatory Visit: Payer: Self-pay | Admitting: Internal Medicine

## 2018-11-21 NOTE — Telephone Encounter (Addendum)
Spoke with Programmer, multimedia Dayton Martes) at [facility] told to cancel the refill sent in on 11/21/2018. The patient should not need a refill.

## 2018-11-21 NOTE — Addendum Note (Signed)
Addended by: Festus Aloe on: 11/21/2018 11:13 AM   Modules accepted: Orders

## 2018-11-21 NOTE — Telephone Encounter (Signed)
Sent in error

## 2018-11-22 ENCOUNTER — Other Ambulatory Visit: Payer: Self-pay | Admitting: Internal Medicine

## 2018-12-02 DIAGNOSIS — E1142 Type 2 diabetes mellitus with diabetic polyneuropathy: Secondary | ICD-10-CM | POA: Diagnosis not present

## 2018-12-02 DIAGNOSIS — M19012 Primary osteoarthritis, left shoulder: Secondary | ICD-10-CM | POA: Diagnosis not present

## 2018-12-02 DIAGNOSIS — I2510 Atherosclerotic heart disease of native coronary artery without angina pectoris: Secondary | ICD-10-CM | POA: Diagnosis not present

## 2018-12-02 DIAGNOSIS — I119 Hypertensive heart disease without heart failure: Secondary | ICD-10-CM | POA: Diagnosis not present

## 2018-12-02 DIAGNOSIS — E785 Hyperlipidemia, unspecified: Secondary | ICD-10-CM | POA: Diagnosis not present

## 2018-12-02 DIAGNOSIS — Z572 Occupational exposure to dust: Secondary | ICD-10-CM | POA: Diagnosis not present

## 2018-12-02 DIAGNOSIS — E663 Overweight: Secondary | ICD-10-CM | POA: Diagnosis not present

## 2018-12-02 DIAGNOSIS — M25561 Pain in right knee: Secondary | ICD-10-CM | POA: Diagnosis not present

## 2018-12-02 DIAGNOSIS — I252 Old myocardial infarction: Secondary | ICD-10-CM | POA: Diagnosis not present

## 2018-12-02 DIAGNOSIS — Z570 Occupational exposure to noise: Secondary | ICD-10-CM | POA: Diagnosis not present

## 2018-12-17 ENCOUNTER — Other Ambulatory Visit: Payer: Self-pay

## 2018-12-17 ENCOUNTER — Encounter: Payer: Self-pay | Admitting: Family Medicine

## 2018-12-17 ENCOUNTER — Ambulatory Visit: Payer: Medicare HMO | Admitting: Family Medicine

## 2018-12-17 VITALS — BP 118/72 | HR 78 | Temp 98.2°F | Ht 71.0 in | Wt 193.8 lb

## 2018-12-17 DIAGNOSIS — Z1211 Encounter for screening for malignant neoplasm of colon: Secondary | ICD-10-CM | POA: Diagnosis not present

## 2018-12-17 DIAGNOSIS — E785 Hyperlipidemia, unspecified: Secondary | ICD-10-CM | POA: Diagnosis not present

## 2018-12-17 DIAGNOSIS — I10 Essential (primary) hypertension: Secondary | ICD-10-CM | POA: Diagnosis not present

## 2018-12-17 DIAGNOSIS — E119 Type 2 diabetes mellitus without complications: Secondary | ICD-10-CM | POA: Diagnosis not present

## 2018-12-17 DIAGNOSIS — K5792 Diverticulitis of intestine, part unspecified, without perforation or abscess without bleeding: Secondary | ICD-10-CM | POA: Diagnosis not present

## 2018-12-17 LAB — CBC WITH DIFFERENTIAL/PLATELET
BASOS ABS: 0 10*3/uL (ref 0.0–0.1)
Basophils Relative: 0.2 % (ref 0.0–3.0)
Eosinophils Absolute: 0 10*3/uL (ref 0.0–0.7)
Eosinophils Relative: 0.8 % (ref 0.0–5.0)
HEMATOCRIT: 47.4 % (ref 39.0–52.0)
HEMOGLOBIN: 16.5 g/dL (ref 13.0–17.0)
Lymphocytes Relative: 27.8 % (ref 12.0–46.0)
Lymphs Abs: 1.5 10*3/uL (ref 0.7–4.0)
MCHC: 34.8 g/dL (ref 30.0–36.0)
MCV: 89.4 fl (ref 78.0–100.0)
MONOS PCT: 7.6 % (ref 3.0–12.0)
Monocytes Absolute: 0.4 10*3/uL (ref 0.1–1.0)
NEUTROS PCT: 63.6 % (ref 43.0–77.0)
Neutro Abs: 3.4 10*3/uL (ref 1.4–7.7)
Platelets: 197 10*3/uL (ref 150.0–400.0)
RBC: 5.3 Mil/uL (ref 4.22–5.81)
RDW: 13.6 % (ref 11.5–15.5)
WBC: 5.3 10*3/uL (ref 4.0–10.5)

## 2018-12-17 LAB — LIPID PANEL
CHOL/HDL RATIO: 5
Cholesterol: 147 mg/dL (ref 0–200)
HDL: 29.4 mg/dL — AB (ref >39.00)
NONHDL: 117.88
Triglycerides: 222 mg/dL — ABNORMAL HIGH (ref 0.0–149.0)
VLDL: 44.4 mg/dL — AB (ref 0.0–40.0)

## 2018-12-17 LAB — COMPREHENSIVE METABOLIC PANEL
ALBUMIN: 4.5 g/dL (ref 3.5–5.2)
ALT: 21 U/L (ref 0–53)
AST: 16 U/L (ref 0–37)
Alkaline Phosphatase: 110 U/L (ref 39–117)
BUN: 12 mg/dL (ref 6–23)
CHLORIDE: 100 meq/L (ref 96–112)
CO2: 28 mEq/L (ref 19–32)
Calcium: 9.8 mg/dL (ref 8.4–10.5)
Creatinine, Ser: 0.84 mg/dL (ref 0.40–1.50)
GFR: 94.09 mL/min (ref >60.00)
Glucose, Bld: 141 mg/dL — ABNORMAL HIGH (ref 70–99)
POTASSIUM: 3.9 meq/L (ref 3.5–5.1)
SODIUM: 136 meq/L (ref 135–145)
Total Bilirubin: 0.9 mg/dL (ref 0.2–1.2)
Total Protein: 7.1 g/dL (ref 6.0–8.3)

## 2018-12-17 LAB — LDL CHOLESTEROL, DIRECT: LDL DIRECT: 179 mg/dL

## 2018-12-17 LAB — HEMOGLOBIN A1C: HEMOGLOBIN A1C: 6.3 % (ref 4.6–6.5)

## 2018-12-17 MED ORDER — AMOXICILLIN-POT CLAVULANATE 875-125 MG PO TABS: 1.0000 | ORAL_TABLET | Freq: Two times a day (BID) | ORAL | 0 refills | Status: DC

## 2018-12-17 NOTE — Progress Notes (Signed)
Subjective:    Patient ID: John Mathis, male    DOB: [birth year], 58 y.o.   MRN: [id number]  HPI This is a 58 yo male who presents today with abdominal pain. Started in his epigastric area about 5 days ago, now also in LLQ. Started with "knot," in upper abdomen, feels like a knife. Having loose stools 1-2 times per day, none today. No fever. Has history of colitis, ? Diverticulitis, overdue for repeat screening colonoscopy.  Overdue for labs, has not been in for follow up due to shoulder surgery and other issues. Ok to get labs today.   Past Medical History:  Diagnosis Date  . Arthritis   . CAD (coronary artery disease)   . CHF (congestive heart failure) (HCC)   . Depression   . Diabetes mellitus without complication (HCC)   . Diverticulitis   . Hyperlipidemia   . Hypertension   . IHD (ischemic heart disease)   . Lyme disease   . Myocardial infarction (HCC)    X2   Past Surgical History:  Procedure Laterality Date  . CARDIAC CATHETERIZATION  2012   x4 stents  . CARDIAC CATHETERIZATION  2014   x6 stents  . CARDIAC CATHETERIZATION  2014   x2 stents  . HERNIA REPAIR    . JOINT REPLACEMENT     partial then full  . KNEE SURGERY Right   . SHOULDER ARTHROSCOPY WITH OPEN ROTATOR CUFF REPAIR AND DISTAL CLAVICLE ACROMINECTOMY Left 05/22/2018   Procedure: SHOULDER ARTHROSCOPY WITH OPEN ROTATOR CUFF REPAIR, DISTAL CLAVICLE EXCISION, AND SUBACROMINAL DECOMPRESSION;  Surgeon: Juanell Fairly, MD;  Location: [facility] ORS;  Service: Orthopedics;  Laterality: Left;  . SMALL INTESTINE SURGERY    . TONSILLECTOMY     Family History  Problem Relation Age of Onset  . Hyperlipidemia Mother   . Other Father        parasite infection  . Mental illness Maternal Grandmother    Social History   Tobacco Use  . Smoking status: Current Every Day Smoker    Packs/day: 0.50    Years: 30.00    Pack years: 15.00    Types: Cigarettes  . Smokeless tobacco: Never Used  Substance Use Topics  .  Alcohol use: No  . Drug use: No      Review of Systems Per HPI    Objective:   Physical Exam Vitals signs reviewed.  Constitutional:      General: He is not in acute distress.    Appearance: He is well-developed and normal weight. He is not ill-appearing, toxic-appearing or diaphoretic.  HENT:     Head: Normocephalic and atraumatic.  Cardiovascular:     Rate and Rhythm: Normal rate and regular rhythm.     Heart sounds: Normal heart sounds.  Pulmonary:     Effort: Pulmonary effort is normal.     Breath sounds: Normal breath sounds.  Abdominal:     General: Abdomen is protuberant. Bowel sounds are normal. There is distension.     Palpations: Abdomen is soft. There is no hepatomegaly or mass.     Tenderness: There is abdominal tenderness in the epigastric area and left lower quadrant. There is no guarding or rebound. Negative signs include Murphy's sign and McBurney's sign.  Skin:    General: Skin is warm and dry.  Neurological:     Mental Status: He is alert and oriented to person, place, and time.  Psychiatric:        Mood and Affect: Mood normal.  Behavior: Behavior normal.       BP 118/72 (BP Location: Left Arm, Patient Position: Sitting, Cuff Size: Normal)   Pulse 78   Temp 98.2 F (36.8 C) (Oral)   Ht 5\' 11"  (1.803 m)   Wt 193 lb 12 oz (87.9 kg)   SpO2 99%   BMI 27.02 kg/m  Wt Readings from Last 3 Encounters:  12/17/18 193 lb 12 oz (87.9 kg)  10/31/18 193 lb 8 oz (87.8 kg)  05/22/18 185 lb (83.9 kg)       Assessment & Plan:  1. Diverticulitis - Provided written and verbal information regarding diagnosis and treatment. - RTC precautions reviewed - amoxicillin-clavulanate (AUGMENTIN) 875-125 MG tablet; Take 1 tablet by mouth 2 (two) times daily.  Dispense: 14 tablet; Refill: 0 - CBC with Differential  2. Type 2 diabetes mellitus without complication, without long-term current use of insulin (HCC) - Hemoglobin A1c - CBC with Differential - Lipid  Panel  3. Essential hypertension - Comprehensive metabolic panel  4. Dyslipidemia - Lipid Panel - Comprehensive metabolic panel  5. Colon cancer screening - Ambulatory referral to Gastroenterology   Olean Ree, FNP-BC  [facility], [facility]  12/17/2018 11:33 AM

## 2018-12-17 NOTE — Patient Instructions (Signed)
Good to see you today  I have sent in prescription for an antibiotic, also add MIralax in the evening  If not better or if worse, please follow up

## 2018-12-18 ENCOUNTER — Telehealth: Payer: Self-pay | Admitting: Internal Medicine

## 2018-12-18 NOTE — Telephone Encounter (Signed)
Patient has history of multiple PCI's but has not had stent placement within the last 12 months.  At our last visit, he was asymptomatic.  I think it is reasonable to discontinue clopidogrel 5 days prior to colonoscopy.  It should be restarted when it is felt safe to do so by his gastroenterologist.  Aspirin 81 mg daily should be continued in the perioperative period.  Yvonne Kendall, MD [facility] Pager: [phone]

## 2018-12-18 NOTE — Telephone Encounter (Signed)
° °  [facility] Pre-operative Risk Assessment    Request for surgical clearance:  1. What type of surgery is being performed? 02/17/2019   2. When is this surgery scheduled? Colonoscopy    3. What type of clearance is required (medical clearance vs. Pharmacy clearance to hold med vs. Both)? Pharmacy   4. Are there any medications that need to be held prior to surgery and how long? Plavix - how many days prior and when to restart   5. Practice name and name of physician performing surgery?  GI, Dr Vicente Males   6. What is your office phone number [phone]    7.   What is your office fax number [phone] Attn: Kelby Aline. Ortega  8.   Anesthesia type (None, local, MAC, general) ? Not listed    Ace Gins 12/18/2018, 11:10 AM  _________________________________________________________________   (provider comments below)

## 2018-12-22 ENCOUNTER — Telehealth: Payer: Self-pay | Admitting: *Deleted

## 2018-12-22 DIAGNOSIS — E785 Hyperlipidemia, unspecified: Secondary | ICD-10-CM

## 2018-12-22 MED ORDER — LISINOPRIL 5 MG PO TABS: 5.0000 mg | ORAL_TABLET | Freq: Every day | ORAL | 2 refills | Status: DC

## 2018-12-22 MED ORDER — CLOPIDOGREL BISULFATE 75 MG PO TABS: 75.0000 mg | ORAL_TABLET | Freq: Every day | ORAL | 2 refills | Status: DC

## 2018-12-22 NOTE — Telephone Encounter (Signed)
Patient agreeable to the referral to Lipid Clinic. Referral placed. Patient advised to call us next week if no one has called to schedule this appointment. He requested refills for plavix and lisinopril. These were sent to pharmacy.  Message sent to scheduling as well.

## 2018-12-22 NOTE — Telephone Encounter (Signed)
-----   Message from Yvonne Kendall, MD sent at 12/19/2018 10:53 AM EST ----- Regarding: FW: lipid panel results Hi Victorino Dike,  I looks like Mr. Senger LDL is well above goal.  Can you set him up in our lipid clinic with the pharmacy team to discuss adding a PCSK9 inhibitor or enrolling in a clinical trial?  Let me know if any questions come up.  Thanks.  Thayer Ohm ----- Message ----- From: [name], FNP Sent: 12/19/2018   9:47 AM EST To: Yvonne Kendall, MD Subject: lipid panel results                            Hi Dr. Okey Dupre,  I recently saw our mutual patient. He was due a fasting lipid panel according to your last note. This was obtained and showed worsening lipid profile despite patient's reported compliance with atorvastatin 80 mg. I will defer to you for additional treatment given his extensive history of CAD.  Warm regards,  Deboraha Sprang, FNP-BC

## 2018-12-26 ENCOUNTER — Telehealth: Payer: Self-pay

## 2018-12-26 NOTE — Telephone Encounter (Signed)
Patient has been asked to stop his Plavix on 02/12/19 as advised by Dr. Okey Dupre on 12/18/18 fax communication received. Continue 81mg  aspirin.  Thanks [employer]

## 2019-01-09 ENCOUNTER — Other Ambulatory Visit: Payer: Self-pay | Admitting: Family Medicine

## 2019-01-15 ENCOUNTER — Other Ambulatory Visit: Payer: Self-pay | Admitting: Family Medicine

## 2019-01-15 DIAGNOSIS — E119 Type 2 diabetes mellitus without complications: Secondary | ICD-10-CM

## 2019-01-28 ENCOUNTER — Telehealth: Payer: Self-pay | Admitting: Gastroenterology

## 2019-01-28 NOTE — Telephone Encounter (Signed)
Returned patients call LDVM informing him that he is still on the scheduled to have his colonoscopy for 02/17/19 with Dr. Tobi Bastos at [facility], however this is subject to change due to COVID19 and if it does we will notify him as soon as we have been informed.  Thanks [employer]

## 2019-01-28 NOTE — Telephone Encounter (Signed)
Pt left vm  To check on his procedure status

## 2019-02-06 NOTE — Telephone Encounter (Signed)
Error

## 2019-02-09 ENCOUNTER — Other Ambulatory Visit: Payer: Self-pay

## 2019-02-11 ENCOUNTER — Ambulatory Visit: Payer: Medicare HMO | Admitting: Internal Medicine

## 2019-03-03 ENCOUNTER — Telehealth: Payer: Self-pay | Admitting: Internal Medicine

## 2019-03-04 ENCOUNTER — Encounter: Payer: Self-pay | Admitting: Internal Medicine

## 2019-03-04 ENCOUNTER — Other Ambulatory Visit: Payer: Self-pay

## 2019-03-04 ENCOUNTER — Telehealth (INDEPENDENT_AMBULATORY_CARE_PROVIDER_SITE_OTHER): Payer: Medicare HMO | Admitting: Internal Medicine

## 2019-03-04 ENCOUNTER — Encounter: Payer: Self-pay | Admitting: Family Medicine

## 2019-03-04 ENCOUNTER — Ambulatory Visit (INDEPENDENT_AMBULATORY_CARE_PROVIDER_SITE_OTHER): Payer: Medicare HMO | Admitting: Family Medicine

## 2019-03-04 ENCOUNTER — Telehealth: Payer: Self-pay | Admitting: Internal Medicine

## 2019-03-04 VITALS — BP 130/80 | HR 83 | Wt 185.0 lb

## 2019-03-04 VITALS — BP 128/80 | HR 83 | Ht 71.0 in | Wt 185.0 lb

## 2019-03-04 DIAGNOSIS — E119 Type 2 diabetes mellitus without complications: Secondary | ICD-10-CM

## 2019-03-04 DIAGNOSIS — I2510 Atherosclerotic heart disease of native coronary artery without angina pectoris: Secondary | ICD-10-CM | POA: Diagnosis not present

## 2019-03-04 DIAGNOSIS — M79671 Pain in right foot: Secondary | ICD-10-CM

## 2019-03-04 DIAGNOSIS — L84 Corns and callosities: Secondary | ICD-10-CM

## 2019-03-04 DIAGNOSIS — I10 Essential (primary) hypertension: Secondary | ICD-10-CM | POA: Diagnosis not present

## 2019-03-04 DIAGNOSIS — R202 Paresthesia of skin: Secondary | ICD-10-CM

## 2019-03-04 DIAGNOSIS — E7849 Other hyperlipidemia: Secondary | ICD-10-CM

## 2019-03-04 DIAGNOSIS — I255 Ischemic cardiomyopathy: Secondary | ICD-10-CM

## 2019-03-04 DIAGNOSIS — Z720 Tobacco use: Secondary | ICD-10-CM | POA: Diagnosis not present

## 2019-03-04 MED ORDER — ALIROCUMAB 150 MG/ML ~~LOC~~ SOAJ: 1.0000 | SUBCUTANEOUS | 11 refills | Status: DC

## 2019-03-04 NOTE — Patient Instructions (Addendum)
Medication Instructions:  Dr. Rennis Golden recommends Praluent 150mg /mL (PCSK9). This is an injectable cholesterol medication, self-administered once every 14 days. This medication will need prior approval with your insurance company, which we will work on. If the medication is not approved initially, we may need to do an appeal with your insurance. We will keep you updated on this process. This medication can be provided at some local pharmacies or be shipped to you from a specialty pharmacy.   You can access a copay card via this website ([email or web address])  If you need a refill on your cardiac medications before your next appointment, please call your pharmacy.   Lab work: FASTING lab work in 3-4 months to assess cholesterol If you have labs (blood work) drawn today and your tests are completely normal, you will receive your results only by: Marland Kitchen MyChart Message (if you have MyChart) OR . A paper copy in the mail If you have any lab test that is abnormal or we need to change your treatment, we will call you to review the results.  Testing/Procedures: NONE  Follow-Up: Dr. Rennis Golden recommends that you schedule a follow up visit with him the in the LIPID CLINIC in 3-4 months. Please have fasting blood work about 1 week prior to this visit and he will review the blood work results with you at your appointment.

## 2019-03-04 NOTE — Telephone Encounter (Signed)
Spoke with patient about med changes per telemedicine visit with Dr. Rennis Golden. He is aware how to self-administer injections. He is aware that the medication will need prior authorization with insurance and that I will be in contact once this has been received. He was made aware that once copay is determined, if affordable, med will be obtained from pharmacy and if not, can look into patient assistance. He states his meds cost him around $15/month. Patient aware he will need labs done 1 week prior to 07/07/19 lipid clinic visit. No additional questions at this time.

## 2019-03-04 NOTE — Progress Notes (Signed)
Virtual Visit via Video Note  I connected with John Mathis on 03/04/19 at  3:00 PM EDT by a video enabled telemedicine application and verified that I am speaking with the correct person using two identifiers.  Location: Patient: In his home Provider: In [facility] at [facility]   I discussed the limitations of evaluation and management by telemedicine and the availability of in person appointments. The patient expressed understanding and agreed to proceed.  History of Present Illness: This is a 58 year old male who requests virtual visit today to discuss pain and abnormality of his fifth digit on his right foot.  He thinks he has a corn.  He has noticed thickening with central solid mass on the pad of his fifth toe.  He has had a corn on this toe before that had to be removed by podiatry.  He reports that he has never had any success with over-the-counter measures such as pumice stone, Dr. Margart SicklesScholl's pads or wart treatment.  It has been causing him a little bit of pain.  He denies any redness or drainage.  He also said has noticed that he has some tingling in his first second and third digits of his right foot.  Does not feel like pins-and-needles but feels like the sensation before you get pins-and-needles.  Last for 10 to 15 minutes and resolve spontaneously.  He has not noticed any swelling, discoloration or change in temperature of his foot.  Last hemoglobin A1c 12/17/2018 was 6.3. He has exercise bike which he is riding 20 minutes every other day and he is walking his dogs daily.  Feels that he is managing okay during the shelter at home time with COVID-19 pandemic.  He has been having more problem with his knees and is following up with orthopedics.  Exercise seems to help.  Had virtual visit with cardiology today to follow-up coronary artery disease and hyperlipidemia.  Past Medical History:  Diagnosis Date  . Arthritis   . CAD (coronary artery disease)   . CHF  (congestive heart failure) (HCC)   . Depression   . Diabetes mellitus without complication (HCC)   . Diverticulitis   . Hyperlipidemia   . Hypertension   . IHD (ischemic heart disease)   . Lyme disease   . Myocardial infarction (HCC)    X2   Past Surgical History:  Procedure Laterality Date  . CARDIAC CATHETERIZATION  2012   x4 stents  . CARDIAC CATHETERIZATION  2014   x6 stents  . CARDIAC CATHETERIZATION  2014   x2 stents  . HERNIA REPAIR    . JOINT REPLACEMENT     partial then full  . KNEE SURGERY Right   . SHOULDER ARTHROSCOPY WITH OPEN ROTATOR CUFF REPAIR AND DISTAL CLAVICLE ACROMINECTOMY Left 05/22/2018   Procedure: SHOULDER ARTHROSCOPY WITH OPEN ROTATOR CUFF REPAIR, DISTAL CLAVICLE EXCISION, AND SUBACROMINAL DECOMPRESSION;  Surgeon: Juanell FairlyKrasinski, Kevin, MD;  Location: [facility] ORS;  Service: Orthopedics;  Laterality: Left;  . SMALL INTESTINE SURGERY    . TONSILLECTOMY     Family History  Problem Relation Age of Onset  . Hyperlipidemia Mother   . Other Father        parasite infection  . Mental illness Maternal Grandmother    Social History   Tobacco Use  . Smoking status: Current Every Day Smoker    Packs/day: 0.50    Years: 30.00    Pack years: 15.00    Types: Cigarettes  . Smokeless tobacco: Never Used  Substance Use Topics  . Alcohol use: No  . Drug use: No    Observations/Objective: Patient is alert and answers questions appropriately.  Visible skin is unremarkable.  He is normally conversive without shortness of breath.  His mood and affect are appropriate.  BP 130/80   Pulse 83   Wt 185 lb (83.9 kg)   BMI 25.80 kg/m  Wt Readings from Last 3 Encounters:  03/04/19 185 lb (83.9 kg)  03/04/19 185 lb (83.9 kg)  12/17/18 193 lb 12 oz (87.9 kg)    Assessment and Plan: 1. Corn of foot -Patient does not wish to explore at home treatments at this time, he prefers referral to podiatry - Ambulatory referral to Podiatry  2. Right foot pain - Ambulatory  referral to Podiatry  3. Paresthesia of right foot - Ambulatory referral to Podiatry  - follow up in 3 months for CPE  Olean Ree, FNP-BC  [facility], [facility]  03/04/2019 4:18 PM   Follow Up Instructions:    I discussed the assessment and treatment plan with the patient. The patient was provided an opportunity to ask questions and all were answered. The patient agreed with the plan and demonstrated an understanding of the instructions.   The patient was advised to call back or seek an in-person evaluation if the symptoms worsen or if the condition fails to improve as anticipated.  [name], FNP

## 2019-03-04 NOTE — Progress Notes (Signed)
Virtual Visit via Video Note   This visit type was conducted due to national recommendations for restrictions regarding the COVID-19 Pandemic (e.g. social distancing) in an effort to limit this patient's exposure and mitigate transmission in our community.  Due to his co-morbid illnesses, this patient is at least at moderate risk for complications without adequate follow up.  This format is felt to be most appropriate for this patient at this time.  All issues noted in this document were discussed and addressed.  A limited physical exam was performed with this format.  Please refer to the patient's chart for his consent to telehealth for [facility].   Evaluation Performed:  Doximity video visit  Date:  03/04/2019   ID:  John Mathis, DOB Jul 06, 1961, MRN [id number]  Patient Location:  [address] [place] [address]  Provider location:   [address] [place], [place] [address]  PCP:  John Belfast, FNP  Cardiologist:  No primary care provider on file. Electrophysiologist:  None   Chief Complaint:  No complaints  History of Present Illness:    John Mathis is a 58 y.o. male who presents via audio/video conferencing for a telehealth visit today.  John Mathis is a pleasant 58 year old male patient of Dr. Okey Mathis, with a history of coronary artery disease and reportedly 14 prior stent placements when living in [place].  He has an ischemic cardiomyopathy with LVEF 30 to 35% by echo in 2017 however it improved to 50 to 55% in 2019.  Other risk factors include hypertension, dyslipidemia, type 2 diabetes and a history of Lyme disease which was treated.  Not interested in quitting at this time.  Although he has discussed this before with Dr. Okey Mathis and was prescribed Chantix.  There is a strong family history of coronary disease, including coronary disease in his mother with marked dyslipidemia and her father as several of his mother's siblings and some of his siblings  that have marked dyslipidemia.  He has 3 children, the 2 older children apparently have been tested for dyslipidemia and had no significant elevations.  Despite being on high intensity statin 80 mg daily, his most recent direct LDL was 179.,  His triglycerides were elevated 222 and his HDL is low at 30.  This metabolic profile is significant for someone perhaps with diabetic disease like he has or could suggest a combination of metabolic syndrome and familial hyperlipidemia or perhaps familial combined hyperlipidemia.  His untreated LDL would likely be much greater than 250 off of his statin.  Currently denies any chest pain or worsening shortness of breath.  The patient does not have symptoms concerning for COVID-19 infection (fever, chills, cough, or new SHORTNESS OF BREATH).    Prior CV studies:   The following studies were reviewed today:  Lab work Chart review  PMHx:  Past Medical History:  Diagnosis Date  . Arthritis   . CAD (coronary artery disease)   . CHF (congestive heart failure) (HCC)   . Depression   . Diabetes mellitus without complication (HCC)   . Diverticulitis   . Hyperlipidemia   . Hypertension   . IHD (ischemic heart disease)   . Lyme disease   . Myocardial infarction (HCC)    X2    Past Surgical History:  Procedure Laterality Date  . CARDIAC CATHETERIZATION  2012   x4 stents  . CARDIAC CATHETERIZATION  2014   x6 stents  . CARDIAC CATHETERIZATION  2014   x2 stents  .  HERNIA REPAIR    . JOINT REPLACEMENT     partial then full  . KNEE SURGERY Right   . SHOULDER ARTHROSCOPY WITH OPEN ROTATOR CUFF REPAIR AND DISTAL CLAVICLE ACROMINECTOMY Left 05/22/2018   Procedure: SHOULDER ARTHROSCOPY WITH OPEN ROTATOR CUFF REPAIR, DISTAL CLAVICLE EXCISION, AND SUBACROMINAL DECOMPRESSION;  Surgeon: John Mathis, Kevin, MD;  Location: [facility] ORS;  Service: Orthopedics;  Laterality: Left;  . SMALL INTESTINE SURGERY    . TONSILLECTOMY      FAMHx:  Family History  Problem  Relation Age of Onset  . Hyperlipidemia Mother   . Other Father        parasite infection  . Mental illness Maternal Grandmother     SOCHx:   reports that he has been smoking cigarettes. He has a 15.00 pack-year smoking history. He has never used smokeless tobacco. He reports that he does not drink alcohol or use drugs.  ALLERGIES:  Allergies  Allergen Reactions  . Ivp Dye [Iodinated Diagnostic Agents] Hives    MEDS:  No outpatient medications have been marked as taking for the 03/04/19 encounter (Telemedicine) with Chrystie NoseHilty, Atoya Andrew C, MD.     ROS: Pertinent items noted in HPI and remainder of comprehensive ROS otherwise negative.  Labs/Other Tests and Data Reviewed:    Recent Labs: 12/17/2018: ALT 21; BUN 12; Creatinine, Ser 0.84; Hemoglobin 16.5; Platelets 197.0; Potassium 3.9; Sodium 136   Recent Lipid Panel Lab Results  Component Value Date/Time   CHOL 147 12/17/2018 12:01 PM   CHOL 154 06/14/2017 11:23 AM   TRIG 222.0 (H) 12/17/2018 12:01 PM   HDL 29.40 (L) 12/17/2018 12:01 PM   HDL 34 (L) 06/14/2017 11:23 AM   CHOLHDL 5 12/17/2018 12:01 PM   LDLCALC 85 06/14/2017 11:23 AM   LDLDIRECT 179.0 12/17/2018 12:01 PM    Wt Readings from Last 3 Encounters:  03/04/19 185 lb (83.9 kg)  12/17/18 193 lb 12 oz (87.9 kg)  10/31/18 193 lb 8 oz (87.8 kg)     Exam:    Vital Signs:  BP 128/80   Pulse 83   Ht 5\' 11"  (1.803 m)   Wt 185 lb (83.9 kg)   BMI 25.80 kg/m    General appearance: alert and no distress Lungs: No audible wheezing or visual respiratory difficulty Extremities: extremities normal, atraumatic, no cyanosis or edema Skin: Skin color, texture, turgor normal. No rashes or lesions Neurologic: Grossly normal Psych: Pleasant  ASSESSMENT & PLAN:    1. 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 Type 2 diabetes 3. Hypertension 4. Coronary artery disease with numerous prior stents 5. Ischemic cardiomyopathy with improved LVEF up  to 50 to 55% (2019) 6. Tobacco abuse  [name] likely has a familial hyperlipidemia or perhaps familial combined hyperlipidemia, noting elevated triglycerides and low HDL although these could be effects of diabetes and metabolic syndrome on top of genetic FH.  His LDL untreated is likely greater than 250.  There is a strong family history of multiple family members with LDL greater than 190 suggestive of an autosomal dominant inheritance pattern.  His goal LDL is less than 70 and he remains above that despite high intensity atorvastatin 80 mg.  He is a good candidate to add PCSK9 inhibitor therapy.  We discussed that today, including the risks and side effects of the medication.  We will pursue Praluent 150 mg every 2 weeks as this is likely the most preferred medication on his health plan.  I will repeat a lipid profile in 3  to 4 months and plan to see him back in the office at that time.  Thanks again for the kind referral.  COVID-19 Education: The signs and symptoms of COVID-19 were discussed with the patient and how to seek care for testing (follow up with PCP or arrange E-visit).  The importance of social distancing was discussed today.  Patient Risk:   After full review of this patients clinical status, I feel that they are at least moderate risk at this time.  Time:   Today, I have spent 25 minutes with the patient with telehealth technology discussing dyslipidemia, extensive family history, history of coronary disease and prior stents, echocardiographic findings, cardiovascular risk factors, dietary education, and smoking cessation.     Medication Adjustments/Labs and Tests Ordered: Current medicines are reviewed at length with the patient today.  Concerns regarding medicines are outlined above.   Tests Ordered: Orders Placed This Encounter  Procedures  . Lipid panel    Medication Changes: Meds ordered this encounter  Medications  . Alirocumab (PRALUENT) 150 MG/ML SOAJ    Sig:  Inject 1 Dose into the skin every 14 (fourteen) days.    Dispense:  2 pen    Refill:  11    Disposition:  in 4 month(s)  [name], MD, [facility], FACP  [place]  [facility]  Medical Director of the Advanced Lipid Disorders &  Cardiovascular Risk Reduction Clinic Diplomate of the American Board of Clinical Lipidology Attending Cardiologist  Direct Dial: [phone]  Fax: [phone]  Website:  [email or web address]  [name], MD  03/04/2019 1:47 PM

## 2019-03-06 ENCOUNTER — Telehealth: Payer: Self-pay

## 2019-03-06 NOTE — Telephone Encounter (Signed)
Cover My Meds Prior Auth Rx: Praluent 150mg /ml Auto injectors Qty: 2 pens Refills: 11 Prescriber: Zoila Shutter  FQH:[id number]  LAST NAME: Bourne  DOB: 01/10/1961  [facility] # [id number] [phone]   Prior authorization submitted via [email or web address]

## 2019-03-06 NOTE — Telephone Encounter (Addendum)
Request Reference Number: [id number]. PRALUENT INJ 150MG /ML is approved through 09/06/2019. For further questions, call [phone].  Patient notified via MyChart.   Called [facility] with this update. Co-pay is $44/month

## 2019-03-13 ENCOUNTER — Encounter: Payer: Self-pay | Admitting: Podiatry

## 2019-03-13 ENCOUNTER — Other Ambulatory Visit: Payer: Self-pay

## 2019-03-13 ENCOUNTER — Ambulatory Visit: Payer: Medicare HMO

## 2019-03-13 ENCOUNTER — Ambulatory Visit: Payer: Medicare HMO | Admitting: Podiatry

## 2019-03-13 VITALS — BP 120/80 | HR 77 | Temp 97.7°F

## 2019-03-13 DIAGNOSIS — L989 Disorder of the skin and subcutaneous tissue, unspecified: Secondary | ICD-10-CM | POA: Diagnosis not present

## 2019-03-13 DIAGNOSIS — M779 Enthesopathy, unspecified: Secondary | ICD-10-CM

## 2019-03-16 ENCOUNTER — Encounter: Payer: Medicare HMO | Admitting: Family Medicine

## 2019-03-16 NOTE — Progress Notes (Signed)
   Subjective: 58 year old male with PMHx of DM presenting today as a new patient with a chief complaint of painful callus lesions noted to the bilateral feet that have been present for the past few months. He reports some intermittent associated numbness for the last 2-3 weeks. Walking and bearing weight increases the pain. He has not done anything for treatment. Patient is here for further evaluation and treatment.   Past Medical History:  Diagnosis Date  . Arthritis   . CAD (coronary artery disease)   . CHF (congestive heart failure) (HCC)   . Depression   . Diabetes mellitus without complication (HCC)   . Diverticulitis   . Hyperlipidemia   . Hypertension   . IHD (ischemic heart disease)   . Lyme disease   . Myocardial infarction (HCC)    X2     Objective:  Physical Exam General: Alert and oriented x3 in no acute distress  Dermatology: Hyperkeratotic lesions present on the right sub-fifth MPJ and the left hallux. Pain on palpation with a central nucleated core noted. Skin is warm, dry and supple bilateral lower extremities. Negative for open lesions or macerations.  Vascular: Palpable pedal pulses bilaterally. No edema or erythema noted. Capillary refill within normal limits.  Neurological: Epicritic and protective threshold grossly intact bilaterally.   Musculoskeletal Exam: Pain on palpation at the keratotic lesion noted. Range of motion within normal limits bilateral. Muscle strength 5/5 in all groups bilateral.  Assessment: 1. Callus right sub-fifth MPJ 2. Porokeratosis left hallux   Plan of Care:  1. Patient evaluated 2. Excisional debridement of keratoic lesion using a chisel blade was performed without incident. Salinocaine applied.  3. Dressed area with light dressing. 4. 3 mLs of Lidocaine 2% used in a digital block fashion for left great toe debridement.  5. Patient is to return to the clinic PRN.   Felecia Shelling, DPM [facility]  Dr.  Felecia Shelling, DPM    [address]                                        [place], [place] [address]                Office [phone]  Fax [phone]

## 2019-04-17 ENCOUNTER — Ambulatory Visit: Payer: Medicare HMO | Admitting: Podiatry

## 2019-04-17 ENCOUNTER — Other Ambulatory Visit: Payer: Self-pay

## 2019-04-17 ENCOUNTER — Encounter: Payer: Self-pay | Admitting: Podiatry

## 2019-04-17 VITALS — Temp 97.2°F

## 2019-04-17 DIAGNOSIS — L989 Disorder of the skin and subcutaneous tissue, unspecified: Secondary | ICD-10-CM

## 2019-04-19 NOTE — Progress Notes (Signed)
   Subjective: 58 year old male with PMHx of DM presenting today for follow up evaluation of a painful callus lesion of the left hallux. Walking increases the pain. He has not had any treatment at home for his symptoms. Patient is here for further evaluation and treatment.   Past Medical History:  Diagnosis Date  . Arthritis   . CAD (coronary artery disease)   . CHF (congestive heart failure) ([place])   . Depression   . Diabetes mellitus without complication ([place])   . Diverticulitis   . Hyperlipidemia   . Hypertension   . IHD (ischemic heart disease)   . Lyme disease   . Myocardial infarction ([place])    X2     Objective:  Physical Exam General: Alert and oriented x3 in no acute distress  Dermatology: Hyperkeratotic lesions present on the left hallux. Pain on palpation with a central nucleated core noted. Skin is warm, dry and supple bilateral lower extremities. Negative for open lesions or macerations.  Vascular: Palpable pedal pulses bilaterally. No edema or erythema noted. Capillary refill within normal limits.  Neurological: Epicritic and protective threshold grossly intact bilaterally.   Musculoskeletal Exam: Pain on palpation at the keratotic lesion noted. Range of motion within normal limits bilateral. Muscle strength 5/5 in all groups bilateral.  Assessment: 1. Callus right sub-fifth MPJ - resolved  2. Porokeratosis left hallux   Plan of Care:  1. Patient evaluated 2. Excisional debridement of keratoic lesion using a chisel blade was performed without incident. Cantharone applied.  3. Dressed area with light dressing. 4. Return to clinic in 3 weeks.   Edrick Kins, DPM [facility]  Dr. Edrick Kins, [place]                                        [place], [place] [address]                Office [phone]  Fax [phone]

## 2019-04-27 NOTE — Telephone Encounter (Signed)
Opened in error

## 2019-05-05 ENCOUNTER — Other Ambulatory Visit: Payer: Self-pay

## 2019-05-05 ENCOUNTER — Other Ambulatory Visit
Admission: RE | Admit: 2019-05-05 | Discharge: 2019-05-05 | Disposition: A | Discharge status: Home / Self Care | Payer: Medicare HMO | Source: Ambulatory Visit | Attending: Gastroenterology | Admitting: Gastroenterology

## 2019-05-05 DIAGNOSIS — Z1159 Encounter for screening for other viral diseases: Secondary | ICD-10-CM | POA: Insufficient documentation

## 2019-05-05 DIAGNOSIS — Z01812 Encounter for preprocedural laboratory examination: Secondary | ICD-10-CM | POA: Insufficient documentation

## 2019-05-05 LAB — SARS CORONAVIRUS 2 (TAT 6-24 HRS): SARS Coronavirus 2: NEGATIVE

## 2019-05-08 ENCOUNTER — Encounter
Admission: RE | Disposition: A | Discharge status: Home / Self Care | Payer: Self-pay | Source: Ambulatory Visit | Attending: Gastroenterology

## 2019-05-08 ENCOUNTER — Ambulatory Visit
Admission: RE | Admit: 2019-05-08 | Discharge: 2019-05-08 | Disposition: A | Discharge status: Home / Self Care | Payer: Medicare HMO | Source: Ambulatory Visit | Attending: Gastroenterology | Admitting: Gastroenterology

## 2019-05-08 ENCOUNTER — Encounter: Payer: Self-pay | Admitting: *Deleted

## 2019-05-08 ENCOUNTER — Ambulatory Visit: Payer: Medicare HMO | Admitting: Registered Nurse

## 2019-05-08 ENCOUNTER — Other Ambulatory Visit: Payer: Self-pay

## 2019-05-08 ENCOUNTER — Ambulatory Visit: Payer: Medicare HMO | Admitting: Podiatry

## 2019-05-08 DIAGNOSIS — Z5309 Procedure and treatment not carried out because of other contraindication: Secondary | ICD-10-CM | POA: Diagnosis not present

## 2019-05-08 DIAGNOSIS — Z1211 Encounter for screening for malignant neoplasm of colon: Secondary | ICD-10-CM

## 2019-05-08 SURGERY — COLONOSCOPY WITH PROPOFOL
Anesthesia: General

## 2019-05-08 MED ORDER — SODIUM CHLORIDE 0.9 % IV SOLN: INTRAVENOUS | Status: DC

## 2019-05-08 NOTE — Progress Notes (Signed)
Patient took Plavix yesterday. Dr. Vicente Males notified and procedure cancelled. Told patient that office will call him and educated him about checking with provider about stopping Plavix or other medication, any time a procedure is scheduled. He understands and says in the past his cardiologist calls him and tells him when to stop the Plavix.

## 2019-05-15 ENCOUNTER — Encounter: Payer: Self-pay | Admitting: Podiatry

## 2019-05-15 ENCOUNTER — Other Ambulatory Visit: Payer: Self-pay

## 2019-05-15 ENCOUNTER — Ambulatory Visit: Payer: Medicare HMO | Admitting: Podiatry

## 2019-05-15 VITALS — Temp 98.0°F

## 2019-05-15 DIAGNOSIS — L989 Disorder of the skin and subcutaneous tissue, unspecified: Secondary | ICD-10-CM

## 2019-05-18 NOTE — Progress Notes (Signed)
   Subjective: 58 year old male with PMHx of DM presenting today for follow up evaluation of a painful callus lesion of the left hallux. Walking increases the pain. He has not had any treatment at home for his symptoms. Patient is here for further evaluation and treatment.   Past Medical History:  Diagnosis Date  . Arthritis   . CAD (coronary artery disease)   . CHF (congestive heart failure) ([place])   . Depression   . Diabetes mellitus without complication ([place])   . Diverticulitis   . Hyperlipidemia   . Hypertension   . IHD (ischemic heart disease)   . Lyme disease   . Myocardial infarction ([place])    X2     Objective:  Physical Exam General: Alert and oriented x3 in no acute distress  Dermatology: Hyperkeratotic lesions present on the left hallux. Pain on palpation with a central nucleated core noted. Skin is warm, dry and supple bilateral lower extremities. Negative for open lesions or macerations.  Vascular: Palpable pedal pulses bilaterally. No edema or erythema noted. Capillary refill within normal limits.  Neurological: Epicritic and protective threshold grossly intact bilaterally.   Musculoskeletal Exam: Pain on palpation at the keratotic lesion noted. Range of motion within normal limits bilateral. Muscle strength 5/5 in all groups bilateral.  Assessment: 1. Callus right sub-fifth MPJ - recurrent  2. Porokeratosis left hallux - improved    Plan of Care:  1. Patient evaluated 2. Excisional debridement of keratoic lesion of the left hallux using a chisel blade was performed without incident. Cantharone applied and light dressing placed .  3. Excisional debridement of keratotic lesion of the sub-fifth MPJ using a chisel blade was performed without incident. Light dressing applied.  4. Return to clinic in 3 weeks.   Edrick Kins, DPM [facility]  Dr. Edrick Kins, [place]                                        [place], [place]  [address]                Office [phone]  Fax [phone]

## 2019-05-20 ENCOUNTER — Telehealth: Payer: Self-pay | Admitting: Internal Medicine

## 2019-05-20 NOTE — Telephone Encounter (Signed)

## 2019-05-21 ENCOUNTER — Other Ambulatory Visit: Payer: Self-pay

## 2019-05-21 ENCOUNTER — Ambulatory Visit: Payer: Medicare HMO | Admitting: Nurse Practitioner

## 2019-05-21 ENCOUNTER — Encounter: Payer: Self-pay | Admitting: Nurse Practitioner

## 2019-05-21 VITALS — BP 118/76 | HR 75 | Ht 71.0 in | Wt 193.5 lb

## 2019-05-21 DIAGNOSIS — I2510 Atherosclerotic heart disease of native coronary artery without angina pectoris: Secondary | ICD-10-CM

## 2019-05-21 DIAGNOSIS — Z720 Tobacco use: Secondary | ICD-10-CM | POA: Diagnosis not present

## 2019-05-21 DIAGNOSIS — I10 Essential (primary) hypertension: Secondary | ICD-10-CM | POA: Diagnosis not present

## 2019-05-21 DIAGNOSIS — I255 Ischemic cardiomyopathy: Secondary | ICD-10-CM | POA: Diagnosis not present

## 2019-05-21 DIAGNOSIS — E785 Hyperlipidemia, unspecified: Secondary | ICD-10-CM | POA: Diagnosis not present

## 2019-05-21 NOTE — Patient Instructions (Signed)
Medication Instructions:  Your physician recommends that you continue on your current medications as directed. Please refer to the Current Medication list given to you today.  If you need a refill on your cardiac medications before your next appointment, please call your pharmacy.   Lab work: None ordered If you have labs (blood work) drawn today and your tests are completely normal, you will receive your results only by: . MyChart Message (if you have MyChart) OR . A paper copy in the mail If you have any lab test that is abnormal or we need to change your treatment, we will call you to review the results.  Testing/Procedures: None ordered  Follow-Up: At [facility], you and your health needs are our priority.  As part of our continuing mission to provide you with exceptional heart care, we have created designated Provider Care Teams.  These Care Teams include your primary Cardiologist (physician) and Advanced Practice Providers (APPs -  Physician Assistants and Nurse Practitioners) who all work together to provide you with the care you need, when you need it. You will need a follow up appointment in 6 months.  Please call our office 2 months in advance to schedule this appointment.  You may see Christopher End, MD or one of the following Advanced Practice Providers on your designated Care Team:   Christopher Berge, NP Ryan Dunn, PA-C . Jacquelyn Visser, PA-C     

## 2019-05-21 NOTE — Progress Notes (Signed)
Office Visit    Patient Name: John PoissonReynaldo Mathis Date of Encounter: 05/21/2019  Primary Care Provider:  Emi BelfastGessner, Deborah B, FNP Primary Cardiologist:  Yvonne Kendallhristopher End, MD  Chief Complaint    58 year old ? with a history of coronary artery disease, ischemic cardiomyopathy with subsequent recovery of LV function, hypertension, hyperlipidemia, diabetes, tobacco abuse, and Lyme disease, who presents for follow-up of CAD and cardiomyopathy.  Past Medical History    Past Medical History:  Diagnosis Date  . Arthritis   . CAD (coronary artery disease)    a. S/p multiple PCIs (report of 14 prior stents in [place]).  . CHF (congestive heart failure) (HCC)   . Depression   . Diabetes mellitus without complication (HCC)   . Diverticulitis   . Familial hyperlipidemia    a. 02/2019 seen in lipid clinic-->Praluent started.  . Hypertension   . Ischemic cardiomyopathy    a. 01/18/2016 Echo [facility] [place] Nl LV size. EF 30-35% w/ inf HK. Trace AI, mild MR. Trace to mild TR. Nl RV size/fxn; b. 11/18/2017 Echo: Mildly dil LV w/ nl wall thickness. LVEF 50-55% w/ no rwma. Nl RV size/fxn.  . Lyme disease   . Myocardial infarction (HCC)    X2   Past Surgical History:  Procedure Laterality Date  . CARDIAC CATHETERIZATION  2012   x4 stents  . CARDIAC CATHETERIZATION  2014   x6 stents  . CARDIAC CATHETERIZATION  2014   x2 stents  . HERNIA REPAIR    . JOINT REPLACEMENT     partial then full  . KNEE SURGERY Right   . SHOULDER ARTHROSCOPY WITH OPEN ROTATOR CUFF REPAIR AND DISTAL CLAVICLE ACROMINECTOMY Left 05/22/2018   Procedure: SHOULDER ARTHROSCOPY WITH OPEN ROTATOR CUFF REPAIR, DISTAL CLAVICLE EXCISION, AND SUBACROMINAL DECOMPRESSION;  Surgeon: Juanell FairlyKrasinski, Kevin, MD;  Location: [facility] ORS;  Service: Orthopedics;  Laterality: Left;  . SMALL INTESTINE SURGERY    . TONSILLECTOMY      Allergies  Allergies  Allergen Reactions  . Ivp Dye [Iodinated Diagnostic Agents] Hives     History of Present Illness    58 year old male with the above complex past medical history including coronary artery disease status post 5814 prior percutaneous interventions and stenting performed in [place], ischemic cardiomyopathy with previously depressed LV function of 30 to 35% by echocardiogram 2017 and subsequent recovery to 50 to 55% by January 2019, hypertension, hyperlipidemia, diabetes, tobacco abuse, and Lyme disease.  He was last seen in cardiology clinic in January of this year, at which time he was doing well.  He has continued to do well since that time.  He walks his dogs several times per day and figures that he gets in about 2 to 3 miles each day.  He denies chest pain, palpitations, dyspnea, claudication, PND, orthopnea, dizziness, syncope, edema, or early satiety.  He was seen in lipid clinic in May and is now using Praluent every 2 weeks and has tolerated this well.  He is due for follow-up in lipid clinic with labs in September.  Home Medications    Prior to Admission medications   Medication Sig Start Date End Date Taking? Authorizing Provider  Alirocumab (PRALUENT) 150 MG/ML SOAJ Inject 1 Dose into the skin every 14 (fourteen) days. 03/04/19   Chrystie NoseHilty, Kenneth C, MD  aspirin EC 81 MG tablet Take 81 mg by mouth daily.    [provider]  atorvastatin (LIPITOR) 80 MG tablet TAKE 1 TABLET BY MOUTH EVERY DAY 11/24/18  End, Cristal Deerhristopher, MD  clopidogrel (PLAVIX) 75 MG tablet Take 1 tablet (75 mg total) by mouth daily. 12/22/18   End, Cristal Deerhristopher, MD  lisinopril (PRINIVIL,ZESTRIL) 5 MG tablet Take 1 tablet (5 mg total) by mouth daily. 12/22/18   End, Cristal Deerhristopher, MD  metFORMIN (GLUCOPHAGE) 500 MG tablet TAKE 1 TABLET (500 MG TOTAL) BY MOUTH 2 (TWO) TIMES DAILY WITH A MEAL. 01/15/19   Emi BelfastGessner, Deborah B, FNP  metoprolol succinate (TOPROL-XL) 25 MG 24 hr tablet TAKE 1 TABLET BY MOUTH TWICE A DAY 01/09/19   Emi BelfastGessner, Deborah B, FNP    Review of Systems    He denies chest  pain, palpitations, dyspnea, pnd, orthopnea, n, v, dizziness, syncope, edema, weight gain, or early satiety.  All other systems reviewed and are otherwise negative except as noted above.  Physical Exam    VS:  BP 118/76 (BP Location: Left Arm, Patient Position: Sitting, Cuff Size: Normal)   Pulse 75   Ht 5\' 11"  (1.803 m)   Wt 193 lb 8 oz (87.8 kg)   BMI 26.99 kg/m  , BMI Body mass index is 26.99 kg/m. GEN: Well nourished, well developed, in no acute distress. HEENT: normal. Neck: Supple, no JVD, carotid bruits, or masses. Cardiac: RRR, no murmurs, rubs, or gallops. No clubbing, cyanosis, edema.  Radials/DP/PT 2+ and equal bilaterally.  Respiratory:  Respirations regular and unlabored, clear to auscultation bilaterally. GI: Soft, nontender, nondistended, BS + x 4. MS: no deformity or atrophy. Skin: warm and dry, no rash. Neuro:  Strength and sensation are intact. Psych: Normal affect.  Accessory Clinical Findings    ECG personally reviewed by me today -regular sinus rhythm, 75, inferior infarct - no acute changes.  Lab Results  Component Value Date   WBC 5.3 12/17/2018   HGB 16.5 12/17/2018   HCT 47.4 12/17/2018   MCV 89.4 12/17/2018   PLT 197.0 12/17/2018   Lab Results  Component Value Date   CREATININE 0.84 12/17/2018   BUN 12 12/17/2018   NA 136 12/17/2018   K 3.9 12/17/2018   CL 100 12/17/2018   CO2 28 12/17/2018   Lab Results  Component Value Date   ALT 21 12/17/2018   AST 16 12/17/2018   ALKPHOS 110 12/17/2018   BILITOT 0.9 12/17/2018   Lab Results  Component Value Date   CHOL 147 12/17/2018   HDL 29.40 (L) 12/17/2018   LDLCALC 85 06/14/2017   LDLDIRECT 179.0 12/17/2018   TRIG 222.0 (H) 12/17/2018   CHOLHDL 5 12/17/2018     Assessment & Plan    1.  Coronary artery disease: Status post multiple percutaneous coronary interventions in [place] in the past.  He has been doing well since his last visit in January and remains active without symptoms or  limitations.  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  2.  Ischemic cardiomyopathy: Low normal LV function by echo in January.  He has been doing well from a cardiac standpoint and has no symptoms of heart failure and is euvolemic on examination today.  Continue beta-blocker and ACE inhibitor.  3.  Hyperlipidemia: Now on Praluent with plan for follow-up labs in September along with follow-up in lipid clinic at that time.  Tolerating well.  4.  Tobacco abuse: Still smoking and not currently interested in quitting.  Cessation advised.  5.  Essential HTN:  Stable on  blocker and acei.  6.  Disposition: Follow-up in lipid clinic as scheduled in September.  Follow-up here in 6 months or sooner if necessary.   Murray Hodgkins, NP 05/21/2019, 9:59 AM

## 2019-06-02 ENCOUNTER — Other Ambulatory Visit: Payer: Self-pay

## 2019-06-02 ENCOUNTER — Encounter: Payer: Self-pay | Admitting: Podiatry

## 2019-06-02 ENCOUNTER — Ambulatory Visit: Payer: Medicare HMO | Admitting: Podiatry

## 2019-06-02 VITALS — Temp 97.5°F

## 2019-06-02 DIAGNOSIS — E1140 Type 2 diabetes mellitus with diabetic neuropathy, unspecified: Secondary | ICD-10-CM

## 2019-06-02 DIAGNOSIS — L989 Disorder of the skin and subcutaneous tissue, unspecified: Secondary | ICD-10-CM

## 2019-06-04 NOTE — Progress Notes (Signed)
   Subjective: 58 year old male with PMHx of DM presenting today for follow up evaluation of a callus lesion of the left hallux and right sub-fifth MPJ. He states the lesions are almost gone. He denies any pain or modifying factors. Patient is here for further evaluation and treatment.   Past Medical History:  Diagnosis Date  . Arthritis   . CAD (coronary artery disease)    a. S/p multiple PCIs (report of 14 prior stents in [place]).  . CHF (congestive heart failure) ([place])   . Depression   . Diabetes mellitus without complication ([place])   . Diverticulitis   . Familial hyperlipidemia    a. 02/2019 seen in lipid clinic-->Praluent started.  . Hypertension   . Ischemic cardiomyopathy    a. 01/18/2016 Echo [facility] [place]): Nl LV size. EF 30-35% w/ inf HK. Trace AI, mild MR. Trace to mild TR. Nl RV size/fxn; b. 11/18/2017 Echo: Mildly dil LV w/ nl wall thickness. LVEF 50-55% w/ no rwma. Nl RV size/fxn.  . Lyme disease   . Myocardial infarction ([place])    X2     Objective:  Physical Exam General: Alert and oriented x3 in no acute distress  Dermatology: Hyperkeratotic lesion present on the left hallux and right sub-fifth MPJ. Pain on palpation with a central nucleated core noted. Skin is warm, dry and supple bilateral lower extremities. Negative for open lesions or macerations.  Vascular: Palpable pedal pulses bilaterally. No edema or erythema noted. Capillary refill within normal limits.  Neurological: Epicritic and protective threshold grossly intact bilaterally.   Musculoskeletal Exam: Pain on palpation at the keratotic lesions noted. Range of motion within normal limits bilateral. Muscle strength 5/5 in all groups bilateral.  Assessment: 1. Callus right sub-fifth MPJ - recurrent  2. Porokeratosis left hallux - improved    Plan of Care:  1. Patient evaluated 2. Excisional debridement of keratoic lesion of the left hallux using a chisel blade was performed  without incident. Salinocaine applied and light dressing placed . 3. Excisional debridement of keratotic lesion of the sub-fifth MPJ using a chisel blade was performed without incident. Light dressing applied.  4. Recommended OTC corn and callus remover.  5. Return to clinic as needed.   Edrick Kins, DPM [facility]  Dr. Edrick Kins, [place]                                        [place], [place] [address]                Office [phone]  Fax [phone]

## 2019-06-15 ENCOUNTER — Other Ambulatory Visit: Payer: Self-pay

## 2019-06-15 ENCOUNTER — Encounter: Payer: Self-pay | Admitting: Family Medicine

## 2019-06-15 ENCOUNTER — Ambulatory Visit (INDEPENDENT_AMBULATORY_CARE_PROVIDER_SITE_OTHER): Payer: Medicare HMO | Admitting: Family Medicine

## 2019-06-15 VITALS — BP 102/60 | HR 71 | Temp 98.1°F | Ht 71.0 in | Wt 191.1 lb

## 2019-06-15 DIAGNOSIS — Z125 Encounter for screening for malignant neoplasm of prostate: Secondary | ICD-10-CM | POA: Diagnosis not present

## 2019-06-15 DIAGNOSIS — R69 Illness, unspecified: Secondary | ICD-10-CM | POA: Diagnosis not present

## 2019-06-15 DIAGNOSIS — I2510 Atherosclerotic heart disease of native coronary artery without angina pectoris: Secondary | ICD-10-CM

## 2019-06-15 DIAGNOSIS — Z114 Encounter for screening for human immunodeficiency virus [HIV]: Secondary | ICD-10-CM

## 2019-06-15 DIAGNOSIS — E119 Type 2 diabetes mellitus without complications: Secondary | ICD-10-CM | POA: Diagnosis not present

## 2019-06-15 DIAGNOSIS — Z0000 Encounter for general adult medical examination without abnormal findings: Secondary | ICD-10-CM

## 2019-06-15 DIAGNOSIS — E785 Hyperlipidemia, unspecified: Secondary | ICD-10-CM | POA: Diagnosis not present

## 2019-06-15 DIAGNOSIS — Z1159 Encounter for screening for other viral diseases: Secondary | ICD-10-CM | POA: Diagnosis not present

## 2019-06-15 DIAGNOSIS — Z23 Encounter for immunization: Secondary | ICD-10-CM | POA: Diagnosis not present

## 2019-06-15 DIAGNOSIS — Z7901 Long term (current) use of anticoagulants: Secondary | ICD-10-CM | POA: Diagnosis not present

## 2019-06-15 LAB — PSA, MEDICARE: PSA: 0.51 ng/ml (ref 0.10–4.00)

## 2019-06-15 LAB — CBC WITH DIFFERENTIAL/PLATELET
Basophils Absolute: 0 10*3/uL (ref 0.0–0.1)
Basophils Relative: 0.3 % (ref 0.0–3.0)
Eosinophils Absolute: 0.1 10*3/uL (ref 0.0–0.7)
Eosinophils Relative: 1.1 % (ref 0.0–5.0)
HCT: 48.1 % (ref 39.0–52.0)
Hemoglobin: 16.4 g/dL (ref 13.0–17.0)
Lymphocytes Relative: 25.8 % (ref 12.0–46.0)
Lymphs Abs: 1.5 10*3/uL (ref 0.7–4.0)
MCHC: 34.1 g/dL (ref 30.0–36.0)
MCV: 90.2 fl (ref 78.0–100.0)
Monocytes Absolute: 0.4 10*3/uL (ref 0.1–1.0)
Monocytes Relative: 6.5 % (ref 3.0–12.0)
Neutro Abs: 3.9 10*3/uL (ref 1.4–7.7)
Neutrophils Relative %: 66.3 % (ref 43.0–77.0)
Platelets: 202 10*3/uL (ref 150.0–400.0)
RBC: 5.33 Mil/uL (ref 4.22–5.81)
RDW: 13.4 % (ref 11.5–15.5)
WBC: 5.8 10*3/uL (ref 4.0–10.5)

## 2019-06-15 LAB — COMPREHENSIVE METABOLIC PANEL
ALT: 18 U/L (ref 0–53)
AST: 16 U/L (ref 0–37)
Albumin: 4.7 g/dL (ref 3.5–5.2)
Alkaline Phosphatase: 132 U/L — ABNORMAL HIGH (ref 39–117)
BUN: 12 mg/dL (ref 6–23)
CO2: 22 mEq/L (ref 19–32)
Calcium: 9.7 mg/dL (ref 8.4–10.5)
Chloride: 99 mEq/L (ref 96–112)
Creatinine, Ser: 0.82 mg/dL (ref 0.40–1.50)
GFR: 96.58 mL/min (ref >60.00)
Glucose, Bld: 91 mg/dL (ref 70–99)
Potassium: 4.4 mEq/L (ref 3.5–5.1)
Sodium: 136 mEq/L (ref 135–145)
Total Bilirubin: 1.2 mg/dL (ref 0.2–1.2)
Total Protein: 7.1 g/dL (ref 6.0–8.3)

## 2019-06-15 LAB — HEMOGLOBIN A1C: Hgb A1c MFr Bld: 6.3 % (ref 4.6–6.5)

## 2019-06-15 LAB — LIPID PANEL
Cholesterol: 54 mg/dL (ref 0–200)
HDL: 30.3 mg/dL — ABNORMAL LOW (ref >39.00)
LDL Cholesterol: 2 mg/dL (ref 0–99)
NonHDL: 23.51
Total CHOL/HDL Ratio: 2
Triglycerides: 110 mg/dL (ref 0.0–149.0)
VLDL: 22 mg/dL (ref 0.0–40.0)

## 2019-06-15 LAB — MICROALBUMIN / CREATININE URINE RATIO
Creatinine,U: 97.1 mg/dL
Microalb Creat Ratio: 0.7 mg/g (ref 0.0–30.0)
Microalb, Ur: <0.7 mg/dL (ref 0.0–1.9)

## 2019-06-15 NOTE — Progress Notes (Signed)
Subjective:   John Mathis is a 58 y.o. male who presents for Medicare Annual/Subsequent preventive examination.  Review of Systems:  Review of Systems  Constitutional: Negative.   HENT: Positive for tinnitus (chronic).   Eyes: Negative.   Respiratory: Negative.   Cardiovascular: Negative.   Gastrointestinal: Negative.   Genitourinary: Negative.   Musculoskeletal: Positive for joint pain (left knee, sometimes shoulders and hands).  Skin: Negative.   Neurological: Negative.   Endo/Heme/Allergies: Negative.   Psychiatric/Behavioral: Negative.           Objective:    Vitals: BP 102/60 (BP Location: Right Arm, Patient Position: Sitting, Cuff Size: Normal)   Pulse 71   Temp 98.1 F (36.7 C) (Temporal)   Ht 5\' 11"  (1.803 m)   Wt 191 lb 1.9 oz (86.7 kg)   SpO2 99%   BMI 26.66 kg/m   Body mass index is 26.66 kg/m.  Advanced Directives 05/08/2019 05/19/2018  Does Patient Have a Medical Advance Directive? No No  Would patient like information on creating a medical advance directive? No - Patient declined No - Patient declined    Tobacco Social History   Tobacco Use  Smoking Status Current Every Day Smoker  . Packs/day: 0.50  . Years: 30.00  . Pack years: 15.00  . Types: Cigarettes  Smokeless Tobacco Never Used     Ready to quit: "I know I should."  Counseling given: Reviewed methods including nicotine replacement, weaning, quitting cold [place].   Clinical Intake:    Past Medical History:  Diagnosis Date  . Arthritis   . CAD (coronary artery disease)    a. S/p multiple PCIs (report of 14 prior stents in [place]).  . CHF (congestive heart failure) (HCC)   . Depression   . Diabetes mellitus without complication (HCC)   . Diverticulitis   . Familial hyperlipidemia    a. 02/2019 seen in lipid clinic-->Praluent started.  . Hypertension   . Ischemic cardiomyopathy    a. 01/18/2016 Echo [facility] [place] Nl LV size. EF 30-35% w/ inf HK.  Trace AI, mild MR. Trace to mild TR. Nl RV size/fxn; b. 11/18/2017 Echo: Mildly dil LV w/ nl wall thickness. LVEF 50-55% w/ no rwma. Nl RV size/fxn.  . Lyme disease   . Myocardial infarction (HCC)    X2   Past Surgical History:  Procedure Laterality Date  . CARDIAC CATHETERIZATION  2012   x4 stents  . CARDIAC CATHETERIZATION  2014   x6 stents  . CARDIAC CATHETERIZATION  2014   x2 stents  . HERNIA REPAIR    . JOINT REPLACEMENT     partial then full  . KNEE SURGERY Right   . SHOULDER ARTHROSCOPY WITH OPEN ROTATOR CUFF REPAIR AND DISTAL CLAVICLE ACROMINECTOMY Left 05/22/2018   Procedure: SHOULDER ARTHROSCOPY WITH OPEN ROTATOR CUFF REPAIR, DISTAL CLAVICLE EXCISION, AND SUBACROMINAL DECOMPRESSION;  Surgeon: John FairlyKrasinski, Kevin, MD;  Location: [facility] ORS;  Service: Orthopedics;  Laterality: Left;  . SMALL INTESTINE SURGERY    . TONSILLECTOMY     Family History  Problem Relation Age of Onset  . Hyperlipidemia Mother   . Other Father        parasite infection  . Mental illness Maternal Grandmother    Social History   Socioeconomic History  . Marital status: Widowed    Spouse name: Not on file  . Number of children: Not on file  . Years of education: Not on file  . Highest education level: Not on file  Occupational History  . Not on file  Social Needs  . Financial resource strain: Not on file  . Food insecurity    Worry: Not on file    Inability: Not on file  . Transportation needs    Medical: Not on file    Non-medical: Not on file  Tobacco Use  . Smoking status: Current Every Day Smoker    Packs/day: 0.50    Years: 30.00    Pack years: 15.00    Types: Cigarettes  . Smokeless tobacco: Never Used  Substance and Sexual Activity  . Alcohol use: No  . Drug use: No  . Sexual activity: Not on file  Lifestyle  . Physical activity    Days per week: Not on file    Minutes per session: Not on file  . Stress: Not on file  Relationships  . Social Herbalist on phone:  Not on file    Gets together: Not on file    Attends religious service: Not on file    Active member of club or organization: Not on file    Attends meetings of clubs or organizations: Not on file    Relationship status: Not on file  Other Topics Concern  . Not on file  Social History Narrative  . Not on file    Outpatient Encounter Medications as of 06/15/2019  Medication Sig  . Alirocumab (PRALUENT) 150 MG/ML SOAJ Inject 1 Dose into the skin every 14 (fourteen) days.  Marland Kitchen aspirin EC 81 MG tablet Take 81 mg by mouth daily.  Marland Kitchen atorvastatin (LIPITOR) 80 MG tablet TAKE 1 TABLET BY MOUTH EVERY DAY  . clopidogrel (PLAVIX) 75 MG tablet Take 1 tablet (75 mg total) by mouth daily.  Marland Kitchen lisinopril (PRINIVIL,ZESTRIL) 5 MG tablet Take 1 tablet (5 mg total) by mouth daily.  . metFORMIN (GLUCOPHAGE) 500 MG tablet TAKE 1 TABLET (500 MG TOTAL) BY MOUTH 2 (TWO) TIMES DAILY WITH A MEAL.  . metoprolol succinate (TOPROL-XL) 25 MG 24 hr tablet TAKE 1 TABLET BY MOUTH TWICE A DAY   No facility-administered encounter medications on file as of 06/15/2019.     Activities of Daily Living In your present state of health, do you have any difficulty performing the following activities: 06/15/2019  Hearing? Y  Comment tinnitis  Vision? N  Difficulty concentrating or making decisions? N  Walking or climbing stairs? N  Dressing or bathing? N  Doing errands, shopping? N  Some recent data might be hidden    Patient Care Team: John Beck, FNP as PCP - General (Nurse Practitioner) End, John Gave, MD as PCP - Cardiology (Cardiology)   Assessment:   This is a routine wellness examination for [name].  Exercise Activities and Dietary recommendations    Goals   Continue to exercise 4-5 days a week      Fall Risk Fall Risk  06/14/2017  Falls in the past year? No   Is the patient's home free of loose throw rugs in walkways, pet beds, electrical cords, etc?   yes      Grab bars in the bathroom? yes       Handrails on the stairs?   yes      Adequate lighting?   yes   Depression Screen PHQ 2/9 Scores 06/15/2019 06/14/2017  PHQ - 2 Score 0 0    Cognitive Function   Mini-Cog - 06/16/19 0744    Normal clock drawing test?  yes    How many words  correct?  3          There is no immunization history on file for this patient.  Qualifies for Shingles Vaccine? Yes, encouraged him to get at his local pharmacy  Screening Tests Health Maintenance  Topic Date Due  . Hepatitis C Screening  [birth year]  . PNEUMOCOCCAL POLYSACCHARIDE VACCINE AGE 37-64 HIGH RISK  09/09/1963  . FOOT EXAM  09/09/1971  . OPHTHALMOLOGY EXAM  09/09/1971  . HIV Screening  09/08/1976  . TETANUS/TDAP  09/08/1980  . COLONOSCOPY  09/09/2011  . INFLUENZA VACCINE  05/30/2019  . HEMOGLOBIN A1C  06/17/2019   Cancer Screenings: Lung: Low Dose CT Chest recommended if Age 27-80 years, 30 pack-year currently smoking OR have quit w/in 15years. Patient does not qualify. Colorectal: Had to cancel procedure due to not having stopped Plavix  Additional Screenings: Hepatitis C Screening:today      Plan:     I have personally reviewed and noted the following in the patient's chart:   . Medical and social history . Use of alcohol, tobacco or illicit drugs  . Current medications and supplements . Functional ability and status . Nutritional status . Physical activity . Advanced directives . List of other physicians . Hospitalizations, surgeries, and ER visits in previous 12 months . Vitals . Screenings to include cognitive, depression, and falls . Referrals and appointments  In addition, I have reviewed and discussed with patient certain preventive protocols, quality metrics, and best practice recommendations. A written personalized care plan for preventive services as well as general preventive health recommendations were provided to patient.   1. Type 2 diabetes mellitus without complication, without long-term  current use of insulin (HCC) - Hemoglobin A1c - Comprehensive metabolic panel - Microalbumin / creatinine urine ratio - Lipid Panel  2. Current use of long term anticoagulation - CBC with Differential  3. CAD, multiple vessel - Lipid Panel  4. Dyslipidemia - Lipid Panel  5. Screening for prostate cancer - PSA, Medicare  6. Encounter for hepatitis C screening test for low risk patient - Hepatitis C antibody  7. Need for pneumococcal vaccination - Pneumococcal polysaccharide vaccine 23-valent greater than or equal to 2yo subcutaneous/IM  8. Screening for HIV without presence of risk factors - HIV Antibody (routine testing w rflx)   Olean Reeeborah , FNP-BC  [facility], [facility]  06/16/2019 8:10 AM   Emi Belfasteborah B , FNP  06/15/2019

## 2019-06-15 NOTE — Patient Instructions (Signed)
  John Mathis , Thank you for taking time to come for your Medicare Wellness Visit. I appreciate your ongoing commitment to your health goals. Please review the following plan we discussed and let me know if I can assist you in the future.   These are the goals we discussed: Goals   Continue to exercise regularly Consider smoking cessation     This is a list of the screening recommended for you and due dates:  Health Maintenance  Topic Date Due  .  Hepatitis C: One time screening is recommended by Center for Disease Control  (CDC) for  adults born from [birth year] through 1965.   1961-06-17  . Pneumococcal vaccine  09/09/1963  . Complete foot exam   09/09/1971  . Eye exam for diabetics  09/09/1971  . HIV Screening  09/08/1976  . Tetanus Vaccine  09/08/1980  . Colon Cancer Screening  09/09/2011  . Flu Shot  05/30/2019  . Hemoglobin A1C  06/17/2019

## 2019-06-17 LAB — HIV ANTIBODY (ROUTINE TESTING W REFLEX): HIV 1&2 Ab, 4th Generation: NONREACTIVE

## 2019-06-17 LAB — HEPATITIS C ANTIBODY
Hepatitis C Ab: NONREACTIVE
SIGNAL TO CUT-OFF: 0.02 (ref <1.00)

## 2019-06-24 DIAGNOSIS — M5136 Other intervertebral disc degeneration, lumbar region: Secondary | ICD-10-CM | POA: Diagnosis not present

## 2019-06-24 DIAGNOSIS — M9903 Segmental and somatic dysfunction of lumbar region: Secondary | ICD-10-CM | POA: Diagnosis not present

## 2019-06-24 DIAGNOSIS — M546 Pain in thoracic spine: Secondary | ICD-10-CM | POA: Diagnosis not present

## 2019-06-24 DIAGNOSIS — M9902 Segmental and somatic dysfunction of thoracic region: Secondary | ICD-10-CM | POA: Diagnosis not present

## 2019-06-25 ENCOUNTER — Encounter: Payer: Self-pay | Admitting: *Deleted

## 2019-06-25 DIAGNOSIS — M9902 Segmental and somatic dysfunction of thoracic region: Secondary | ICD-10-CM | POA: Diagnosis not present

## 2019-06-25 DIAGNOSIS — M5136 Other intervertebral disc degeneration, lumbar region: Secondary | ICD-10-CM | POA: Diagnosis not present

## 2019-06-25 DIAGNOSIS — M9903 Segmental and somatic dysfunction of lumbar region: Secondary | ICD-10-CM | POA: Diagnosis not present

## 2019-06-25 DIAGNOSIS — M546 Pain in thoracic spine: Secondary | ICD-10-CM | POA: Diagnosis not present

## 2019-06-29 DIAGNOSIS — M9903 Segmental and somatic dysfunction of lumbar region: Secondary | ICD-10-CM | POA: Diagnosis not present

## 2019-06-29 DIAGNOSIS — M9902 Segmental and somatic dysfunction of thoracic region: Secondary | ICD-10-CM | POA: Diagnosis not present

## 2019-06-29 DIAGNOSIS — M546 Pain in thoracic spine: Secondary | ICD-10-CM | POA: Diagnosis not present

## 2019-06-29 DIAGNOSIS — M5136 Other intervertebral disc degeneration, lumbar region: Secondary | ICD-10-CM | POA: Diagnosis not present

## 2019-06-30 DIAGNOSIS — M9903 Segmental and somatic dysfunction of lumbar region: Secondary | ICD-10-CM | POA: Diagnosis not present

## 2019-06-30 DIAGNOSIS — M9902 Segmental and somatic dysfunction of thoracic region: Secondary | ICD-10-CM | POA: Diagnosis not present

## 2019-06-30 DIAGNOSIS — M546 Pain in thoracic spine: Secondary | ICD-10-CM | POA: Diagnosis not present

## 2019-06-30 DIAGNOSIS — M5136 Other intervertebral disc degeneration, lumbar region: Secondary | ICD-10-CM | POA: Diagnosis not present

## 2019-07-03 ENCOUNTER — Encounter: Payer: Self-pay | Admitting: Internal Medicine

## 2019-07-03 DIAGNOSIS — M5136 Other intervertebral disc degeneration, lumbar region: Secondary | ICD-10-CM | POA: Diagnosis not present

## 2019-07-03 DIAGNOSIS — M9903 Segmental and somatic dysfunction of lumbar region: Secondary | ICD-10-CM | POA: Diagnosis not present

## 2019-07-03 DIAGNOSIS — M546 Pain in thoracic spine: Secondary | ICD-10-CM | POA: Diagnosis not present

## 2019-07-03 DIAGNOSIS — M9902 Segmental and somatic dysfunction of thoracic region: Secondary | ICD-10-CM | POA: Diagnosis not present

## 2019-07-07 ENCOUNTER — Encounter: Payer: Self-pay | Admitting: Internal Medicine

## 2019-07-07 ENCOUNTER — Telehealth (INDEPENDENT_AMBULATORY_CARE_PROVIDER_SITE_OTHER): Payer: Medicare HMO | Admitting: Internal Medicine

## 2019-07-07 DIAGNOSIS — E7849 Other hyperlipidemia: Secondary | ICD-10-CM | POA: Diagnosis not present

## 2019-07-07 DIAGNOSIS — I255 Ischemic cardiomyopathy: Secondary | ICD-10-CM

## 2019-07-07 DIAGNOSIS — I2510 Atherosclerotic heart disease of native coronary artery without angina pectoris: Secondary | ICD-10-CM

## 2019-07-07 DIAGNOSIS — E119 Type 2 diabetes mellitus without complications: Secondary | ICD-10-CM

## 2019-07-07 NOTE — Patient Instructions (Signed)
Medication Instructions:  DECREASE atorvastatin to 40mg  daily Continue other medications  If you need a refill on your cardiac medications before your next appointment, please call your pharmacy.   Lab work: FASTING lab work in 6 months to check cholesterol Please complete about 1 week prior to next lipid clinic appointment   If you have labs (blood work) drawn today and your tests are completely normal, you will receive your results only by: Marland Kitchen MyChart Message (if you have MyChart) OR . A paper copy in the mail If you have any lab test that is abnormal or we need to change your treatment, we will call you to review the results.  Testing/Procedures: NONE  Follow-Up: Dr. Debara Pickett recommends that you schedule a follow up visit with him the in the [place] in 6 months. Please have fasting blood work about 1 week prior to this visit and he will review the blood work results with you at your appointment.

## 2019-07-07 NOTE — Progress Notes (Signed)
Virtual Visit via Video Note   This visit type was conducted due to national recommendations for restrictions regarding the COVID-19 Pandemic (e.g. social distancing) in an effort to limit this patient's exposure and mitigate transmission in our community.  Due to his co-morbid illnesses, this patient is at least at moderate risk for complications without adequate follow up.  This format is felt to be most appropriate for this patient at this time.  All issues noted in this document were discussed and addressed.  A limited physical exam was performed with this format.  Please refer to the patient's chart for his consent to telehealth for [facility].   Evaluation Performed:  Doximity video visit  Date:  07/07/2019   ID:  John Mathis, DOB [birth year], MRN [id number]  Patient Location:  [address] [place] [address]  Provider location:   [address], [address] [place], [place] [address]  PCP:  [name], FNP  Cardiologist:  Yvonne Kendall, MD Electrophysiologist:  None   Chief Complaint:  Injection site discomfort  History of Present Illness:    John Mathis is a 58 y.o. male who presents via audio/video conferencing for a telehealth visit today.  Mr. Sollie is a pleasant 58 year old male patient of Dr. Okey Dupre, with a history of coronary artery disease and reportedly 14 prior stent placements when living in [place].  He has an ischemic cardiomyopathy with LVEF 30 to 35% by echo in 2017 however it improved to 50 to 55% in 2019.  Other risk factors include hypertension, dyslipidemia, type 2 diabetes and a history of Lyme disease which was treated.  Not interested in quitting at this time.  Although he has discussed this before with Dr. Okey Dupre and was prescribed Chantix.  There is a strong family history of coronary disease, including coronary disease in his mother with marked dyslipidemia and her father as several of his mother's siblings and some of his siblings that  have marked dyslipidemia.  He has 3 children, the 2 older children apparently have been tested for dyslipidemia and had no significant elevations.  Despite being on high intensity statin 80 mg daily, his most recent direct LDL was 179.,  His triglycerides were elevated 222 and his HDL is low at 30.  This metabolic profile is significant for someone perhaps with diabetic disease like he has or could suggest a combination of metabolic syndrome and familial hyperlipidemia or perhaps familial combined hyperlipidemia.  His untreated LDL would likely be much greater than 250 off of his statin.  Currently denies any chest pain or worsening shortness of breath.  07/07/2019  Mr. Sieker is seen today in follow-up.  He reports some discomfort in injection site redness which persists for about a day after injection and does seem to improve.  He seems to be tolerating the Praluent otherwise and has had marked reduction in cholesterol.  His last lab work 3 weeks ago showed total cholesterol now 54 (reduced from 147), triglycerides 110, HDL 30 and LDL cholesterol of 2.  Based on his excellent response to his PCSK9 inhibitor, I feel we could likely decrease his statin dose to 40 mg of atorvastatin daily.  The patient does not have symptoms concerning for COVID-19 infection (fever, chills, cough, or new SHORTNESS OF BREATH).    Prior CV studies:   The following studies were reviewed today:  Lab work Chart review  PMHx:  Past Medical History:  Diagnosis Date   Arthritis    CAD (coronary artery  disease)    a. S/p multiple PCIs (report of 14 prior stents in [place]).   CHF (congestive heart failure) ([place])    Depression    Diabetes mellitus without complication ([place])    Diverticulitis    Familial hyperlipidemia    a. 02/2019 seen in lipid clinic-->Praluent started.   Hypertension    Ischemic cardiomyopathy    a. 01/18/2016 Echo [facility] [place]): Nl LV size. EF 30-35% w/ inf HK.  Trace AI, mild MR. Trace to mild TR. Nl RV size/fxn; b. 11/18/2017 Echo: Mildly dil LV w/ nl wall thickness. LVEF 50-55% w/ no rwma. Nl RV size/fxn.   Lyme disease    Myocardial infarction ([place])    X2    Past Surgical History:  Procedure Laterality Date   CARDIAC CATHETERIZATION  2012   x4 stents   CARDIAC CATHETERIZATION  2014   x6 stents   CARDIAC CATHETERIZATION  2014   x2 stents   HERNIA REPAIR     JOINT REPLACEMENT     partial then full   KNEE SURGERY Right    SHOULDER ARTHROSCOPY WITH OPEN ROTATOR CUFF REPAIR AND DISTAL CLAVICLE ACROMINECTOMY Left 05/22/2018   Procedure: SHOULDER ARTHROSCOPY WITH OPEN ROTATOR CUFF REPAIR, DISTAL CLAVICLE EXCISION, AND SUBACROMINAL DECOMPRESSION;  Surgeon: Thornton Park, MD;  Location: [facility] ORS;  Service: Orthopedics;  Laterality: Left;   SMALL INTESTINE SURGERY     TONSILLECTOMY      FAMHx:  Family History  Problem Relation Age of Onset   Hyperlipidemia Mother    Other Father        parasite infection   Mental illness Maternal Grandmother     SOCHx:   reports that he has been smoking cigarettes. He has a 15.00 pack-year smoking history. He has never used smokeless tobacco. He reports that he does not drink alcohol or use drugs.  ALLERGIES:  Allergies  Allergen Reactions   Ivp Dye [Iodinated Diagnostic Agents] Hives    MEDS:  Current Meds  Medication Sig   Alirocumab (PRALUENT) 150 MG/ML SOAJ Inject 1 Dose into the skin every 14 (fourteen) days.   aspirin EC 81 MG tablet Take 81 mg by mouth daily.   atorvastatin (LIPITOR) 80 MG tablet Take 40 mg by mouth daily.   clopidogrel (PLAVIX) 75 MG tablet Take 1 tablet (75 mg total) by mouth daily.   lisinopril (PRINIVIL,ZESTRIL) 5 MG tablet Take 1 tablet (5 mg total) by mouth daily.   metFORMIN (GLUCOPHAGE) 500 MG tablet TAKE 1 TABLET (500 MG TOTAL) BY MOUTH 2 (TWO) TIMES DAILY WITH A MEAL.   metoprolol succinate (TOPROL-XL) 25 MG 24 hr tablet TAKE 1 TABLET BY  MOUTH TWICE A DAY   [DISCONTINUED] atorvastatin (LIPITOR) 80 MG tablet TAKE 1 TABLET BY MOUTH EVERY DAY     ROS: Pertinent items noted in HPI and remainder of comprehensive ROS otherwise negative.  Labs/Other Tests and Data Reviewed:    Recent Labs: 06/15/2019: ALT 18; BUN 12; Creatinine, Ser 0.82; Hemoglobin 16.4; Platelets 202.0; Potassium 4.4; Sodium 136   Recent Lipid Panel Lab Results  Component Value Date/Time   CHOL 54 06/15/2019 11:17 AM   CHOL 154 06/14/2017 11:23 AM   TRIG 110.0 06/15/2019 11:17 AM   HDL 30.30 (L) 06/15/2019 11:17 AM   HDL 34 (L) 06/14/2017 11:23 AM   CHOLHDL 2 06/15/2019 11:17 AM   LDLCALC 2 06/15/2019 11:17 AM   LDLCALC 85 06/14/2017 11:23 AM   LDLDIRECT 179.0 12/17/2018 12:01 PM  Wt Readings from Last 3 Encounters:  06/15/19 191 lb 1.9 oz (86.7 kg)  05/21/19 193 lb 8 oz (87.8 kg)  05/08/19 190 lb (86.2 kg)     Exam:    Vital Signs:  There were no vitals taken for this visit.   General appearance: alert and no distress Lungs: No audible wheezing or visual respiratory difficulty Extremities: extremities normal, atraumatic, no cyanosis or edema Skin: Skin color, texture, turgor normal. No rashes or lesions Neurologic: Grossly normal Psych: Pleasant  ASSESSMENT & PLAN:    1. 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 Type 2 diabetes 3. Hypertension 4. Coronary artery disease with numerous prior stents 5. Ischemic cardiomyopathy with improved LVEF up to 50 to 55% (2019) 6. Tobacco abuse  Mr. Lindaann SloughGulin has had marked reduction in his dyslipidemia with the Praluent. A1c is stable and improved at 6.3.  Liver enzymes were normal with mild elevation in alkaline phosphatase which is not of concern.  Nonetheless, given his low LDL of 2, I feel we could now decrease his atorvastatin from 80 to 40 mg daily.  Plan follow-up with a lipid profile in 6 months.  COVID-19 Education: The signs and symptoms of COVID-19 were  discussed with the patient and how to seek care for testing (follow up with PCP or arrange E-visit).  The importance of social distancing was discussed today.  Patient Risk:   After full review of this patients clinical status, I feel that they are at least moderate risk at this time.  Time:   Today, I have spent 25 minutes with the patient with telehealth technology discussing dyslipidemia, extensive family history, history of coronary disease and prior stents, echocardiographic findings, cardiovascular risk factors, dietary education, and smoking cessation.     Medication Adjustments/Labs and Tests Ordered: Current medicines are reviewed at length with the patient today.  Concerns regarding medicines are outlined above.   Tests Ordered: Orders Placed This Encounter  Procedures   Lipid panel    Medication Changes: No orders of the defined types were placed in this encounter.   Disposition:  in 6 month(s)  Chrystie NoseKenneth C. Donnis Pecha, MD, [facility]ACC, FACP  [place]   [facility]  Medical Director of the Advanced Lipid Disorders &  Cardiovascular Risk Reduction Clinic Diplomate of the American Board of Clinical Lipidology Attending Cardiologist  Direct Dial: [phone]   Fax: [phone]  Website:  [email or web address]  Chrystie NoseKenneth C Jatia Musa, MD  07/07/2019 10:54 AM

## 2019-07-09 DIAGNOSIS — M546 Pain in thoracic spine: Secondary | ICD-10-CM | POA: Diagnosis not present

## 2019-07-09 DIAGNOSIS — M9902 Segmental and somatic dysfunction of thoracic region: Secondary | ICD-10-CM | POA: Diagnosis not present

## 2019-07-09 DIAGNOSIS — M5136 Other intervertebral disc degeneration, lumbar region: Secondary | ICD-10-CM | POA: Diagnosis not present

## 2019-07-09 DIAGNOSIS — M9903 Segmental and somatic dysfunction of lumbar region: Secondary | ICD-10-CM | POA: Diagnosis not present

## 2019-07-10 DIAGNOSIS — M9902 Segmental and somatic dysfunction of thoracic region: Secondary | ICD-10-CM | POA: Diagnosis not present

## 2019-07-10 DIAGNOSIS — M5136 Other intervertebral disc degeneration, lumbar region: Secondary | ICD-10-CM | POA: Diagnosis not present

## 2019-07-10 DIAGNOSIS — M9903 Segmental and somatic dysfunction of lumbar region: Secondary | ICD-10-CM | POA: Diagnosis not present

## 2019-07-10 DIAGNOSIS — M546 Pain in thoracic spine: Secondary | ICD-10-CM | POA: Diagnosis not present

## 2019-07-13 DIAGNOSIS — M5136 Other intervertebral disc degeneration, lumbar region: Secondary | ICD-10-CM | POA: Diagnosis not present

## 2019-07-13 DIAGNOSIS — M546 Pain in thoracic spine: Secondary | ICD-10-CM | POA: Diagnosis not present

## 2019-07-13 DIAGNOSIS — M9902 Segmental and somatic dysfunction of thoracic region: Secondary | ICD-10-CM | POA: Diagnosis not present

## 2019-07-13 DIAGNOSIS — M9903 Segmental and somatic dysfunction of lumbar region: Secondary | ICD-10-CM | POA: Diagnosis not present

## 2019-07-15 DIAGNOSIS — M5136 Other intervertebral disc degeneration, lumbar region: Secondary | ICD-10-CM | POA: Diagnosis not present

## 2019-07-15 DIAGNOSIS — M9902 Segmental and somatic dysfunction of thoracic region: Secondary | ICD-10-CM | POA: Diagnosis not present

## 2019-07-15 DIAGNOSIS — M9903 Segmental and somatic dysfunction of lumbar region: Secondary | ICD-10-CM | POA: Diagnosis not present

## 2019-07-15 DIAGNOSIS — M546 Pain in thoracic spine: Secondary | ICD-10-CM | POA: Diagnosis not present

## 2019-07-17 DIAGNOSIS — M5136 Other intervertebral disc degeneration, lumbar region: Secondary | ICD-10-CM | POA: Diagnosis not present

## 2019-07-17 DIAGNOSIS — M9902 Segmental and somatic dysfunction of thoracic region: Secondary | ICD-10-CM | POA: Diagnosis not present

## 2019-07-17 DIAGNOSIS — M9903 Segmental and somatic dysfunction of lumbar region: Secondary | ICD-10-CM | POA: Diagnosis not present

## 2019-07-17 DIAGNOSIS — M546 Pain in thoracic spine: Secondary | ICD-10-CM | POA: Diagnosis not present

## 2019-07-20 ENCOUNTER — Other Ambulatory Visit: Payer: Self-pay | Admitting: Family Medicine

## 2019-07-20 DIAGNOSIS — M5136 Other intervertebral disc degeneration, lumbar region: Secondary | ICD-10-CM | POA: Diagnosis not present

## 2019-07-20 DIAGNOSIS — M546 Pain in thoracic spine: Secondary | ICD-10-CM | POA: Diagnosis not present

## 2019-07-20 DIAGNOSIS — M9902 Segmental and somatic dysfunction of thoracic region: Secondary | ICD-10-CM | POA: Diagnosis not present

## 2019-07-20 DIAGNOSIS — M9903 Segmental and somatic dysfunction of lumbar region: Secondary | ICD-10-CM | POA: Diagnosis not present

## 2019-07-22 DIAGNOSIS — M9902 Segmental and somatic dysfunction of thoracic region: Secondary | ICD-10-CM | POA: Diagnosis not present

## 2019-07-22 DIAGNOSIS — M9903 Segmental and somatic dysfunction of lumbar region: Secondary | ICD-10-CM | POA: Diagnosis not present

## 2019-07-22 DIAGNOSIS — M546 Pain in thoracic spine: Secondary | ICD-10-CM | POA: Diagnosis not present

## 2019-07-22 DIAGNOSIS — M5136 Other intervertebral disc degeneration, lumbar region: Secondary | ICD-10-CM | POA: Diagnosis not present

## 2019-07-24 DIAGNOSIS — M5136 Other intervertebral disc degeneration, lumbar region: Secondary | ICD-10-CM | POA: Diagnosis not present

## 2019-07-24 DIAGNOSIS — M9903 Segmental and somatic dysfunction of lumbar region: Secondary | ICD-10-CM | POA: Diagnosis not present

## 2019-07-24 DIAGNOSIS — M9902 Segmental and somatic dysfunction of thoracic region: Secondary | ICD-10-CM | POA: Diagnosis not present

## 2019-07-24 DIAGNOSIS — M546 Pain in thoracic spine: Secondary | ICD-10-CM | POA: Diagnosis not present

## 2019-07-27 DIAGNOSIS — M546 Pain in thoracic spine: Secondary | ICD-10-CM | POA: Diagnosis not present

## 2019-07-27 DIAGNOSIS — M9902 Segmental and somatic dysfunction of thoracic region: Secondary | ICD-10-CM | POA: Diagnosis not present

## 2019-07-27 DIAGNOSIS — M9903 Segmental and somatic dysfunction of lumbar region: Secondary | ICD-10-CM | POA: Diagnosis not present

## 2019-07-27 DIAGNOSIS — M5136 Other intervertebral disc degeneration, lumbar region: Secondary | ICD-10-CM | POA: Diagnosis not present

## 2019-07-29 DIAGNOSIS — M546 Pain in thoracic spine: Secondary | ICD-10-CM | POA: Diagnosis not present

## 2019-07-29 DIAGNOSIS — M9902 Segmental and somatic dysfunction of thoracic region: Secondary | ICD-10-CM | POA: Diagnosis not present

## 2019-07-29 DIAGNOSIS — M5136 Other intervertebral disc degeneration, lumbar region: Secondary | ICD-10-CM | POA: Diagnosis not present

## 2019-07-29 DIAGNOSIS — M9903 Segmental and somatic dysfunction of lumbar region: Secondary | ICD-10-CM | POA: Diagnosis not present

## 2019-08-03 DIAGNOSIS — M5136 Other intervertebral disc degeneration, lumbar region: Secondary | ICD-10-CM | POA: Diagnosis not present

## 2019-08-03 DIAGNOSIS — M9902 Segmental and somatic dysfunction of thoracic region: Secondary | ICD-10-CM | POA: Diagnosis not present

## 2019-08-03 DIAGNOSIS — M546 Pain in thoracic spine: Secondary | ICD-10-CM | POA: Diagnosis not present

## 2019-08-03 DIAGNOSIS — M9903 Segmental and somatic dysfunction of lumbar region: Secondary | ICD-10-CM | POA: Diagnosis not present

## 2019-08-05 DIAGNOSIS — M546 Pain in thoracic spine: Secondary | ICD-10-CM | POA: Diagnosis not present

## 2019-08-05 DIAGNOSIS — M9903 Segmental and somatic dysfunction of lumbar region: Secondary | ICD-10-CM | POA: Diagnosis not present

## 2019-08-05 DIAGNOSIS — M9902 Segmental and somatic dysfunction of thoracic region: Secondary | ICD-10-CM | POA: Diagnosis not present

## 2019-08-05 DIAGNOSIS — M5136 Other intervertebral disc degeneration, lumbar region: Secondary | ICD-10-CM | POA: Diagnosis not present

## 2019-08-10 DIAGNOSIS — M9902 Segmental and somatic dysfunction of thoracic region: Secondary | ICD-10-CM | POA: Diagnosis not present

## 2019-08-10 DIAGNOSIS — M5136 Other intervertebral disc degeneration, lumbar region: Secondary | ICD-10-CM | POA: Diagnosis not present

## 2019-08-10 DIAGNOSIS — M546 Pain in thoracic spine: Secondary | ICD-10-CM | POA: Diagnosis not present

## 2019-08-10 DIAGNOSIS — M9903 Segmental and somatic dysfunction of lumbar region: Secondary | ICD-10-CM | POA: Diagnosis not present

## 2019-08-12 ENCOUNTER — Telehealth: Payer: Self-pay | Admitting: Internal Medicine

## 2019-08-12 DIAGNOSIS — M546 Pain in thoracic spine: Secondary | ICD-10-CM | POA: Diagnosis not present

## 2019-08-12 DIAGNOSIS — M5136 Other intervertebral disc degeneration, lumbar region: Secondary | ICD-10-CM | POA: Diagnosis not present

## 2019-08-12 DIAGNOSIS — M9903 Segmental and somatic dysfunction of lumbar region: Secondary | ICD-10-CM | POA: Diagnosis not present

## 2019-08-12 DIAGNOSIS — M9902 Segmental and somatic dysfunction of thoracic region: Secondary | ICD-10-CM | POA: Diagnosis not present

## 2019-08-12 NOTE — Telephone Encounter (Signed)
PA for praluent submitted via [email or web address] Key: [id number] - PA Case ID: [id number]

## 2019-08-13 NOTE — Telephone Encounter (Signed)
Request Reference Number: [id number]. PRALUENT INJ 150MG /ML is approved through 08/11/2020.

## 2019-08-17 DIAGNOSIS — M9903 Segmental and somatic dysfunction of lumbar region: Secondary | ICD-10-CM | POA: Diagnosis not present

## 2019-08-17 DIAGNOSIS — M546 Pain in thoracic spine: Secondary | ICD-10-CM | POA: Diagnosis not present

## 2019-08-17 DIAGNOSIS — M9902 Segmental and somatic dysfunction of thoracic region: Secondary | ICD-10-CM | POA: Diagnosis not present

## 2019-08-17 DIAGNOSIS — M5136 Other intervertebral disc degeneration, lumbar region: Secondary | ICD-10-CM | POA: Diagnosis not present

## 2019-08-19 DIAGNOSIS — M546 Pain in thoracic spine: Secondary | ICD-10-CM | POA: Diagnosis not present

## 2019-08-19 DIAGNOSIS — M9903 Segmental and somatic dysfunction of lumbar region: Secondary | ICD-10-CM | POA: Diagnosis not present

## 2019-08-19 DIAGNOSIS — M5136 Other intervertebral disc degeneration, lumbar region: Secondary | ICD-10-CM | POA: Diagnosis not present

## 2019-08-19 DIAGNOSIS — M9902 Segmental and somatic dysfunction of thoracic region: Secondary | ICD-10-CM | POA: Diagnosis not present

## 2019-08-24 DIAGNOSIS — M546 Pain in thoracic spine: Secondary | ICD-10-CM | POA: Diagnosis not present

## 2019-08-24 DIAGNOSIS — M9902 Segmental and somatic dysfunction of thoracic region: Secondary | ICD-10-CM | POA: Diagnosis not present

## 2019-08-24 DIAGNOSIS — M5136 Other intervertebral disc degeneration, lumbar region: Secondary | ICD-10-CM | POA: Diagnosis not present

## 2019-08-24 DIAGNOSIS — M9903 Segmental and somatic dysfunction of lumbar region: Secondary | ICD-10-CM | POA: Diagnosis not present

## 2019-08-26 DIAGNOSIS — M9902 Segmental and somatic dysfunction of thoracic region: Secondary | ICD-10-CM | POA: Diagnosis not present

## 2019-08-26 DIAGNOSIS — M9903 Segmental and somatic dysfunction of lumbar region: Secondary | ICD-10-CM | POA: Diagnosis not present

## 2019-08-26 DIAGNOSIS — M546 Pain in thoracic spine: Secondary | ICD-10-CM | POA: Diagnosis not present

## 2019-08-26 DIAGNOSIS — M5136 Other intervertebral disc degeneration, lumbar region: Secondary | ICD-10-CM | POA: Diagnosis not present

## 2019-09-11 ENCOUNTER — Other Ambulatory Visit: Payer: Self-pay | Admitting: Internal Medicine

## 2019-11-12 NOTE — Progress Notes (Signed)
Follow-up Outpatient Visit Date: 11/18/2019  Primary Care Provider: Elby Beck, [place] [id number]  Chief Complaint: Follow-up coronary artery disease  HPI:  John Mathis is a 59 y.o. male with history of CADwith report of 51 prior stent placements while living in [place], ischemic cardiomyopathy with LVEF of 30-35% by echo in 2017 but found to be 50-55% in 10/2017, hypertension, hyperlipidemia, diabetes mellitus, and Lyme disease, who presents for follow-up of coronary artery disease.  He was last seen in our office in 04/2019 by Ignacia Bayley, NP, at which time he was doing well without any symptoms.  He had been started on Praluent by the lipid clinic in 02/2019 and was tolerating this well.  Due to marked reduction in cholesterol, atorvastatin was decreased to 40 mg daily with continuation of Praluent when John Mathis last spoke with Dr. Debara Pickett in September.  Today, John Mathis reports feeling well, denying chest pain, shortness of breath, palpitations, lightheadedness, and edema.  He is tolerating Praluent well, though he sometimes notes mild "wooziness" for about an hour after giving himself the injection.  Home BP is typically similar to today's reading.  He is trying to exercise regularly.  --------------------------------------------------------------------------------------------------  Cardiovascular History & Procedures: Cardiovascular Problems:  Coronary artery disease status post multiple PCI's  Chronic systolic heart failure secondary to ischemiccardiomyopathy  Risk Factors:  Known coronary artery disease, hypertension, hyperlipidemia, diabetes mellitus, male gender, and age greater than 55  Cath/PCI:  None available. Patient reports a total of 14 stents from 2012 through 2014  CV Surgery:  None  EP Procedures and Devices:  None  Non-Invasive Evaluation(s):  TTE (11/18/17): Mildly dilated LV with normal wall thickness. LVEF  50-55% with normal wall motion and diastolic function. Normal RV size and function.  TTE (01/18/16, [facility],[place], [place]):Normal LV size. LVEF 30-35% with inferior hypokinesis. Trace aortic and mild mitral regurgitation. Trace to mild tricuspid regurgitation. Normal RV size and function.  Recent CV Pertinent Labs: Lab Results  Component Value Date   CHOL 54 06/15/2019   CHOL 154 06/14/2017   HDL 30.30 (L) 06/15/2019   HDL 34 (L) 06/14/2017   LDLCALC 2 06/15/2019   LDLCALC 85 06/14/2017   LDLDIRECT 179.0 12/17/2018   TRIG 110.0 06/15/2019   CHOLHDL 2 06/15/2019   INR 1.00 05/19/2018   K 4.4 06/15/2019   BUN 12 06/15/2019   BUN 13 06/14/2017   CREATININE 0.82 06/15/2019    Past medical and surgical history were reviewed and updated in EPIC.  Current Meds  Medication Sig  . Alirocumab (PRALUENT) 150 MG/ML SOAJ Inject 1 Dose into the skin every 14 (fourteen) days.  Marland Kitchen aspirin EC 81 MG tablet Take 81 mg by mouth daily.  Marland Kitchen atorvastatin (LIPITOR) 40 MG tablet Take 40 mg by mouth daily.  . clopidogrel (PLAVIX) 75 MG tablet TAKE 1 TABLET BY MOUTH EVERY DAY  . lisinopril (ZESTRIL) 5 MG tablet TAKE 1 TABLET BY MOUTH EVERY DAY  . metFORMIN (GLUCOPHAGE) 500 MG tablet TAKE 1 TABLET (500 MG TOTAL) BY MOUTH 2 (TWO) TIMES DAILY WITH A MEAL.  . metoprolol succinate (TOPROL-XL) 25 MG 24 hr tablet TAKE 1 TABLET BY MOUTH TWICE A DAY    Allergies: Ivp dye [iodinated diagnostic agents]  Social History   Tobacco Use  . Smoking status: Current Every Day Smoker    Packs/day: 0.50    Years: 30.00    Pack years: 15.00    Types: Cigarettes  .  Smokeless tobacco: Never Used  Substance Use Topics  . Alcohol use: No  . Drug use: No    Family History  Problem Relation Age of Onset  . Hyperlipidemia Mother   . Other Father        parasite infection  . Mental illness Maternal Grandmother     Review of Systems: A 12-system review of systems was performed and was  negative except as noted in the HPI.  --------------------------------------------------------------------------------------------------  Physical Exam: BP 106/78 (BP Location: Left Arm, Patient Position: Sitting, Cuff Size: Normal)   Pulse 75   Ht 5\' 11"  (1.803 m)   Wt 195 lb 8 oz (88.7 kg)   SpO2 99%   BMI 27.27 kg/m   General:  NAD. HEENT: No conjunctival pallor or scleral icterus. Facemask in place. Neck: Supple without lymphadenopathy, thyromegaly, JVD, or HJR. Lungs: Normal work of breathing. Clear to auscultation bilaterally without wheezes or crackles. Heart: Regular rate and rhythm without murmurs, rubs, or gallops. Non-displaced PMI. Abd: Bowel sounds present. Soft, NT/ND without hepatosplenomegaly Ext: No lower extremity edema. Skin: Warm and dry without rash.  EKG:  NSR with inferior Q waves and nonspecific T wave abnormality.  No significant change since 05/21/2019.  Lab Results  Component Value Date   WBC 5.8 06/15/2019   HGB 16.4 06/15/2019   HCT 48.1 06/15/2019   MCV 90.2 06/15/2019   PLT 202.0 06/15/2019    Lab Results  Component Value Date   NA 136 06/15/2019   K 4.4 06/15/2019   CL 99 06/15/2019   CO2 22 06/15/2019   BUN 12 06/15/2019   CREATININE 0.82 06/15/2019   GLUCOSE 91 06/15/2019   ALT 18 06/15/2019    Lab Results  Component Value Date   CHOL 54 06/15/2019   HDL 30.30 (L) 06/15/2019   LDLCALC 2 06/15/2019   LDLDIRECT 179.0 12/17/2018   TRIG 110.0 06/15/2019   CHOLHDL 2 06/15/2019    --------------------------------------------------------------------------------------------------  ASSESSMENT AND PLAN: Coronary artery disease: No angina or other symptoms to suggest worsening coronary insufficieny.  Given multiple prior stents, we will continue indefinite DAPT.  Ischemic cardiomyopathy: John Mathis appears euvolemic and well-compensated with NYHA class I HF symptoms.  LVEF was 50-55% by echo in 10/2017.  Continue current doses of  metoprolol succinate and lisinopril, as borderline low blood pressure precludes further escalation.  Hyperlipidemia: Marked reduction in total cholesterol and LDL noted after addition of Praluent, leading to deescalation of statin therapy after last visit with Dr. 11/2017.  I advised John Mathis to continue his current medications and to continue following with Dr. Lindaann Slough for management of his hyperlipidemia.  Tobacco abuse: I encouraged John Mathis to quit, though he remains in the contemplative stage.  He reports having tried Chantix in the past without success.  Hypertension: Blood pressure very well controlled on current regimen of metoprolol and lisinopril.  No medication changes at this time.  Follow-up: Return to clinic in 6 months.  Lindaann Slough, MD 11/18/2019 9:36 AM

## 2019-11-18 ENCOUNTER — Ambulatory Visit: Payer: Medicare HMO | Admitting: Internal Medicine

## 2019-11-18 ENCOUNTER — Encounter: Payer: Self-pay | Admitting: Internal Medicine

## 2019-11-18 ENCOUNTER — Other Ambulatory Visit: Payer: Self-pay

## 2019-11-18 VITALS — BP 106/78 | HR 75 | Ht 71.0 in | Wt 195.5 lb

## 2019-11-18 DIAGNOSIS — I10 Essential (primary) hypertension: Secondary | ICD-10-CM

## 2019-11-18 DIAGNOSIS — E785 Hyperlipidemia, unspecified: Secondary | ICD-10-CM | POA: Diagnosis not present

## 2019-11-18 DIAGNOSIS — I255 Ischemic cardiomyopathy: Secondary | ICD-10-CM

## 2019-11-18 DIAGNOSIS — Z720 Tobacco use: Secondary | ICD-10-CM

## 2019-11-18 DIAGNOSIS — I2510 Atherosclerotic heart disease of native coronary artery without angina pectoris: Secondary | ICD-10-CM

## 2019-11-18 MED ORDER — ATORVASTATIN CALCIUM 40 MG PO TABS: 40.0000 mg | ORAL_TABLET | Freq: Every day | ORAL | 6 refills | Status: DC

## 2019-11-18 NOTE — Patient Instructions (Signed)
Medication Instructions:  Your physician recommends that you continue on your current medications as directed. Please refer to the Current Medication list given to you today.  *If you need a refill on your cardiac medications before your next appointment, please call your pharmacy*  Lab Work: None ordered  If you have labs (blood work) drawn today and your tests are completely normal, you will receive your results only by: . MyChart Message (if you have MyChart) OR . A paper copy in the mail If you have any lab test that is abnormal or we need to change your treatment, we will call you to review the results.  Testing/Procedures: None ordered   Follow-Up: At [facility], you and your health needs are our priority.  As part of our continuing mission to provide you with exceptional heart care, we have created designated Provider Care Teams.  These Care Teams include your primary Cardiologist (physician) and Advanced Practice Providers (APPs -  Physician Assistants and Nurse Practitioners) who all work together to provide you with the care you need, when you need it.  Your next appointment:   6 month(s)  The format for your next appointment:   In Person  Provider:    You may see Christopher End, MD or one of the following Advanced Practice Providers on your designated Care Team:    Christopher Berge, NP  Ryan Dunn, PA-C  Jacquelyn Visser, PA-C   

## 2019-12-28 ENCOUNTER — Ambulatory Visit: Payer: Medicare HMO | Admitting: Internal Medicine

## 2019-12-28 ENCOUNTER — Encounter: Payer: Self-pay | Admitting: Internal Medicine

## 2019-12-28 ENCOUNTER — Other Ambulatory Visit: Payer: Self-pay

## 2019-12-28 VITALS — BP 122/80 | Temp 97.7°F | Ht 71.0 in | Wt 199.6 lb

## 2019-12-28 DIAGNOSIS — I2510 Atherosclerotic heart disease of native coronary artery without angina pectoris: Secondary | ICD-10-CM

## 2019-12-28 DIAGNOSIS — E7849 Other hyperlipidemia: Secondary | ICD-10-CM | POA: Diagnosis not present

## 2019-12-28 DIAGNOSIS — E119 Type 2 diabetes mellitus without complications: Secondary | ICD-10-CM | POA: Diagnosis not present

## 2019-12-28 DIAGNOSIS — I255 Ischemic cardiomyopathy: Secondary | ICD-10-CM

## 2019-12-28 NOTE — Progress Notes (Signed)
OFFICE NOTE  Chief Complaint:  Follow-up dyslipidemia  Primary Care Physician: Elby Beck, FNP  HPI:  John Mathis is a 59 y.o. male with a past medial history significant for a history of coronary artery disease and reportedly 14 prior stent placements when living in [place].  He has an ischemic cardiomyopathy with LVEF 30 to 35% by echo in 2017 however it improved to 50 to 55% in 2019.  Other risk factors include hypertension, dyslipidemia, type 2 diabetes and a history of Lyme disease which was treated.  Not interested in quitting at this time.  Although he has discussed this before with Dr. Saunders Revel and was prescribed Chantix.  There is a strong family history of coronary disease, including coronary disease in his mother with marked dyslipidemia and her father as several of his mother's siblings and some of his siblings that have marked dyslipidemia.  He has 3 children, the 2 older children apparently have been tested for dyslipidemia and had no significant elevations.  Despite being on high intensity statin 80 mg daily, his most recent direct LDL was 179.,  His triglycerides were elevated 222 and his HDL is low at 30.  This metabolic profile is significant for someone perhaps with diabetic disease like he has or could suggest a combination of metabolic syndrome and familial hyperlipidemia or perhaps familial combined hyperlipidemia.  His untreated LDL would likely be much greater than 250 off of his statin.  Currently denies any chest pain or worsening shortness of breath.  07/07/2019  John Mathis is seen today in follow-up.  He reports some discomfort in injection site redness which persists for about a day after injection and does seem to improve.  He seems to be tolerating the Praluent otherwise and has had marked reduction in cholesterol.  His last lab work 3 weeks ago showed total cholesterol now 54 (reduced from 147), triglycerides 110, HDL 30 and LDL cholesterol of 2.  Based on his  excellent response to his PCSK9 inhibitor, I feel we could likely decrease his statin dose to 40 mg of atorvastatin daily.  12/28/2019  John Mathis is seen today in follow-up.  He continues to do well on Praluent.  As his last LDL was 2, I advised him to decrease his atorvastatin from 80 to 40 mg daily.  He has not had repeat lipids and we will plan to get those today.  He was feeling some wooziness which she attributed to the high-dose statin however reducing the dose has improved those symptoms.  PMHx:  Past Medical History:  Diagnosis Date  . Arthritis   . CAD (coronary artery disease)    a. S/p multiple PCIs (report of 14 prior stents in [place]).  . CHF (congestive heart failure) ([place])   . Depression   . Diabetes mellitus without complication ([place])   . Diverticulitis   . Familial hyperlipidemia    a. 02/2019 seen in lipid clinic-->Praluent started.  . Hypertension   . Ischemic cardiomyopathy    a. 01/18/2016 Echo [facility] [place]): Nl LV size. EF 30-35% w/ inf HK. Trace AI, mild MR. Trace to mild TR. Nl RV size/fxn; b. 11/18/2017 Echo: Mildly dil LV w/ nl wall thickness. LVEF 50-55% w/ no rwma. Nl RV size/fxn.  . Lyme disease   . Myocardial infarction ([place])    X2    Past Surgical History:  Procedure Laterality Date  . CARDIAC CATHETERIZATION  2012   x4 stents  . CARDIAC CATHETERIZATION  2014   x6 stents  . CARDIAC CATHETERIZATION  2014   x2 stents  . HERNIA REPAIR    . JOINT REPLACEMENT     partial then full  . KNEE SURGERY Right   . SHOULDER ARTHROSCOPY WITH OPEN ROTATOR CUFF REPAIR AND DISTAL CLAVICLE ACROMINECTOMY Left 05/22/2018   Procedure: SHOULDER ARTHROSCOPY WITH OPEN ROTATOR CUFF REPAIR, DISTAL CLAVICLE EXCISION, AND SUBACROMINAL DECOMPRESSION;  Surgeon: Juanell Fairly, MD;  Location: [facility] ORS;  Service: Orthopedics;  Laterality: Left;  . SMALL INTESTINE SURGERY    . TONSILLECTOMY      FAMHx:  Family History  Problem Relation Age of Onset  .  Hyperlipidemia Mother   . Other Father        parasite infection  . Mental illness Maternal Grandmother     SOCHx:   reports that he has been smoking cigarettes. He has a 15.00 pack-year smoking history. He has never used smokeless tobacco. He reports that he does not drink alcohol or use drugs.  ALLERGIES:  Allergies  Allergen Reactions  . Ivp Dye [Iodinated Diagnostic Agents] Hives    ROS: Pertinent items noted in HPI and remainder of comprehensive ROS otherwise negative.  HOME MEDS: Current Outpatient Medications on File Prior to Visit  Medication Sig Dispense Refill  . Alirocumab (PRALUENT) 150 MG/ML SOAJ Inject 1 Dose into the skin every 14 (fourteen) days. 2 pen 11  . aspirin EC 81 MG tablet Take 81 mg by mouth daily.    Marland Kitchen atorvastatin (LIPITOR) 40 MG tablet Take 1 tablet (40 mg total) by mouth daily. 30 tablet 6  . clopidogrel (PLAVIX) 75 MG tablet TAKE 1 TABLET BY MOUTH EVERY DAY 90 tablet 2  . lisinopril (ZESTRIL) 5 MG tablet TAKE 1 TABLET BY MOUTH EVERY DAY 90 tablet 2  . metFORMIN (GLUCOPHAGE) 500 MG tablet TAKE 1 TABLET (500 MG TOTAL) BY MOUTH 2 (TWO) TIMES DAILY WITH A MEAL. 180 tablet 3  . metoprolol succinate (TOPROL-XL) 25 MG 24 hr tablet TAKE 1 TABLET BY MOUTH TWICE A DAY 180 tablet 2   No current facility-administered medications on file prior to visit.    LABS/IMAGING: No results found for this or any previous visit (from the past 48 hour(s)). No results found.  LIPID PANEL:    Component Value Date/Time   CHOL 54 06/15/2019 1117   CHOL 154 06/14/2017 1123   TRIG 110.0 06/15/2019 1117   HDL 30.30 (L) 06/15/2019 1117   HDL 34 (L) 06/14/2017 1123   CHOLHDL 2 06/15/2019 1117   VLDL 22.0 06/15/2019 1117   LDLCALC 2 06/15/2019 1117   LDLCALC 85 06/14/2017 1123   LDLDIRECT 179.0 12/17/2018 1201     WEIGHTS: Wt Readings from Last 3 Encounters:  12/28/19 199 lb 9.6 oz (90.5 kg)  11/18/19 195 lb 8 oz (88.7 kg)  06/15/19 191 lb 1.9 oz (86.7 kg)     VITALS: BP 122/80   Temp 97.7 F (36.5 C)   Ht 5\' 11"  (1.803 m)   Wt 199 lb 9.6 oz (90.5 kg)   BMI 27.84 kg/m   EXAM: Deferred  EKG: Deferred  ASSESSMENT: 1. 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 Type 2 diabetes 3. Hypertension 4. Coronary artery disease with numerous prior stents 5. Ischemic cardiomyopathy with improved LVEF up to 50 to 55% (2019) 6. Tobacco abuse  PLAN: 1.   John Mathis has had an excellent response to Praluent.  His LDL was quite low and therefore elected to decrease the atorvastatin dose.  He is due for repeat lipid testing which we will perform today.  His side effects have improved somewhat I think from reducing the statin.  Plan follow-up with me in a year and will continue his current treatments.  [name], MD, [facility], FACP    [facility]  Medical Director of the Advanced Lipid Disorders &  Cardiovascular Risk Reduction Clinic Diplomate of the American Board of Clinical Lipidology Attending Cardiologist  Direct Dial: [phone]  Fax: [phone]  Website:  [email or web address] 12/28/2019, 3:06 PM

## 2019-12-28 NOTE — Patient Instructions (Signed)
Medication Instructions:  Your physician recommends that you continue on your current medications as directed. Please refer to the Current Medication list given to you today.  *If you need a refill on your cardiac medications before your next appointment, please call your pharmacy*   Lab Work: FASTING lab work today to check cholesterol   If you have labs (blood work) drawn today and your tests are completely normal, you will receive your results only by: Marland Kitchen MyChart Message (if you have MyChart) OR . A paper copy in the mail If you have any lab test that is abnormal or we need to change your treatment, we will call you to review the results.   Testing/Procedures: NONE   Follow-Up: At [facility], you and your health needs are our priority.  As part of our continuing mission to provide you with exceptional heart care, we have created designated Provider Care Teams.  These Care Teams include your primary Cardiologist (physician) and Advanced Practice Providers (APPs -  Physician Assistants and Nurse Practitioners) who all work together to provide you with the care you need, when you need it.  We recommend signing up for the patient portal called "MyChart".  Sign up information is provided on this After Visit Summary.  MyChart is used to connect with patients for Virtual Visits (Telemedicine).  Patients are able to view lab/test results, encounter notes, upcoming appointments, etc.  Non-urgent messages can be sent to your provider as well.   To learn more about what you can do with MyChart, go to [email or web address].    Your next appointment:   12 month(s)  The format for your next appointment:   Either In Person or Virtual  Provider:   Kirtland Bouchard [place] Hilty, MD   Other Instructions

## 2019-12-29 LAB — LIPID PANEL
Chol/HDL Ratio: 2.1 ratio (ref 0.0–5.0)
Cholesterol, Total: 68 mg/dL — ABNORMAL LOW (ref 100–199)
HDL: 33 mg/dL — ABNORMAL LOW
LDL Chol Calc (NIH): 5 mg/dL (ref 0–99)
Triglycerides: 195 mg/dL — ABNORMAL HIGH (ref 0–149)
VLDL Cholesterol Cal: 30 mg/dL (ref 5–40)

## 2020-01-16 ENCOUNTER — Encounter (INDEPENDENT_AMBULATORY_CARE_PROVIDER_SITE_OTHER): Payer: Self-pay

## 2020-01-21 ENCOUNTER — Other Ambulatory Visit: Payer: Self-pay | Admitting: Family Medicine

## 2020-01-21 DIAGNOSIS — E119 Type 2 diabetes mellitus without complications: Secondary | ICD-10-CM

## 2020-01-21 NOTE — Telephone Encounter (Signed)
Appointment 3/31 Pt aware

## 2020-01-21 NOTE — Telephone Encounter (Signed)
Patient is due for follow up. Please schedule when possible. Thank you 

## 2020-01-27 ENCOUNTER — Encounter: Payer: Self-pay | Admitting: Family Medicine

## 2020-01-27 ENCOUNTER — Other Ambulatory Visit: Payer: Self-pay

## 2020-01-27 ENCOUNTER — Ambulatory Visit: Payer: Medicare HMO | Admitting: Family Medicine

## 2020-01-27 VITALS — BP 118/64 | HR 68 | Temp 97.9°F | Ht 71.0 in | Wt 195.0 lb

## 2020-01-27 DIAGNOSIS — Z122 Encounter for screening for malignant neoplasm of respiratory organs: Secondary | ICD-10-CM

## 2020-01-27 DIAGNOSIS — Z1211 Encounter for screening for malignant neoplasm of colon: Secondary | ICD-10-CM

## 2020-01-27 DIAGNOSIS — Z7902 Long term (current) use of antithrombotics/antiplatelets: Secondary | ICD-10-CM | POA: Diagnosis not present

## 2020-01-27 DIAGNOSIS — E119 Type 2 diabetes mellitus without complications: Secondary | ICD-10-CM | POA: Diagnosis not present

## 2020-01-27 DIAGNOSIS — I10 Essential (primary) hypertension: Secondary | ICD-10-CM | POA: Diagnosis not present

## 2020-01-27 DIAGNOSIS — Z23 Encounter for immunization: Secondary | ICD-10-CM

## 2020-01-27 DIAGNOSIS — Z720 Tobacco use: Secondary | ICD-10-CM

## 2020-01-27 LAB — BASIC METABOLIC PANEL
BUN: 11 mg/dL (ref 6–23)
CO2: 30 mEq/L (ref 19–32)
Calcium: 9.6 mg/dL (ref 8.4–10.5)
Chloride: 101 mEq/L (ref 96–112)
Creatinine, Ser: 0.87 mg/dL (ref 0.40–1.50)
GFR: 90.01 mL/min (ref >60.00)
Glucose, Bld: 110 mg/dL — ABNORMAL HIGH (ref 70–99)
Potassium: 4.2 mEq/L (ref 3.5–5.1)
Sodium: 136 mEq/L (ref 135–145)

## 2020-01-27 LAB — CBC WITH DIFFERENTIAL/PLATELET
Basophils Absolute: 0 10*3/uL (ref 0.0–0.1)
Basophils Relative: 0.3 % (ref 0.0–3.0)
Eosinophils Absolute: 0.1 10*3/uL (ref 0.0–0.7)
Eosinophils Relative: 1 % (ref 0.0–5.0)
HCT: 48.5 % (ref 39.0–52.0)
Hemoglobin: 17 g/dL (ref 13.0–17.0)
Lymphocytes Relative: 23.7 % (ref 12.0–46.0)
Lymphs Abs: 1.6 10*3/uL (ref 0.7–4.0)
MCHC: 35 g/dL (ref 30.0–36.0)
MCV: 90.1 fl (ref 78.0–100.0)
Monocytes Absolute: 0.5 10*3/uL (ref 0.1–1.0)
Monocytes Relative: 6.8 % (ref 3.0–12.0)
Neutro Abs: 4.6 10*3/uL (ref 1.4–7.7)
Neutrophils Relative %: 68.2 % (ref 43.0–77.0)
Platelets: 116 10*3/uL — ABNORMAL LOW (ref 150.0–400.0)
RBC: 5.38 Mil/uL (ref 4.22–5.81)
RDW: 13.6 % (ref 11.5–15.5)
WBC: 6.7 10*3/uL (ref 4.0–10.5)

## 2020-01-27 LAB — HEMOGLOBIN A1C: Hgb A1c MFr Bld: 6.3 % (ref 4.6–6.5)

## 2020-01-27 NOTE — Patient Instructions (Signed)
Good to see you today  Follow up in 6 months for your complete physical  You will get a call about lung cancer screening  Call and reschedule your colonoscopy for the fall

## 2020-01-27 NOTE — Progress Notes (Signed)
   Subjective:    Patient ID: John Mathis, male    DOB: [birth year], 59 y.o.   MRN: [id number]  HPI Chief Complaint  Patient presents with  . Follow-up   This is a 59 yo male who presents today for follow up of chronic medical conditions.   DM type 2- maintaining weight. Occasional diarrhea with metformin.   Hyperlipidemia- followed by lipid clinic  Tobacco abuse- continues to smoke, not interested in quitting at this time  Lung cancer screening- ok to refer for low dose CT scan  Colon cancer screening- prepped for colonoscopy, went for procedure but had not been told to hold clopidogrel so procedure was not done. He was planning to reschedule for end of summer or fall.    Review of Systems Per HPI and denies chest pain, SOB, leg swelling, fatigue    Objective:   Physical Exam Vitals reviewed.  Constitutional:      General: He is not in acute distress.    Appearance: He is normal weight. He is not ill-appearing, toxic-appearing or diaphoretic.  HENT:     Head: Normocephalic and atraumatic.  Eyes:     Conjunctiva/sclera: Conjunctivae normal.  Cardiovascular:     Rate and Rhythm: Normal rate.  Pulmonary:     Effort: Pulmonary effort is normal.  Neurological:     Mental Status: He is alert and oriented to person, place, and time.  Psychiatric:        Mood and Affect: Mood normal.        Behavior: Behavior normal.        Thought Content: Thought content normal.        Judgment: Judgment normal.       BP 118/64 (BP Location: Left Arm, Patient Position: Sitting, Cuff Size: Normal)   Pulse 68   Temp 97.9 F (36.6 C) (Temporal)   Ht 5\' 11"  (1.803 m)   Wt 195 lb (88.5 kg)   SpO2 98%   BMI 27.20 kg/m  Wt Readings from Last 3 Encounters:  01/27/20 195 lb (88.5 kg)  12/28/19 199 lb 9.6 oz (90.5 kg)  11/18/19 195 lb 8 oz (88.7 kg)       Assessment & Plan:  1. Type 2 diabetes mellitus without complication, without long-term current use of insulin (HCC) -  Hemoglobin A1c - Basic Metabolic Panel  2. Essential hypertension - well controlled - Basic Metabolic Panel  3. Long term current use of clopidogrel - CBC with Differential  4. Screening for colon cancer - encouraged patient to schedule colonoscopy  5. Encounter for screening for lung cancer - Ambulatory Referral for Lung Cancer Scre  6. Tobacco abuse - not ready to quit - Ambulatory Referral for Lung Cancer Scre  7. Need for Tdap vaccination - encouraged patient to have 2 months after last Covid vaccine   11/20/19, FNP-BC  [facility], [facility]  01/27/2020 8:37 AM

## 2020-01-28 ENCOUNTER — Telehealth: Payer: Self-pay | Admitting: *Deleted

## 2020-01-28 DIAGNOSIS — Z87891 Personal history of nicotine dependence: Secondary | ICD-10-CM

## 2020-01-28 NOTE — Telephone Encounter (Signed)
Received referral for low dose lung cancer screening CT scan. Message left at phone number listed in EMR for patient to call me back to facilitate scheduling scan.  

## 2020-01-29 NOTE — Addendum Note (Signed)
Addended by: Jonne Ply on: 01/29/2020 11:07 AM   Modules accepted: Orders

## 2020-01-29 NOTE — Telephone Encounter (Signed)
Received referral for initial lung cancer screening scan. Contacted patient and obtained smoking history,(current, 53.75 pack year) as well as answering questions related to screening process. Patient denies signs of lung cancer such as weight loss or hemoptysis. Patient denies comorbidity that would prevent curative treatment if lung cancer were found. Patient is scheduled for shared decision making visit and CT scan on 02/09/20 at 115pm.

## 2020-02-09 ENCOUNTER — Ambulatory Visit
Admission: RE | Admit: 2020-02-09 | Discharge: 2020-02-09 | Disposition: A | Discharge status: Home / Self Care | Payer: Medicare HMO | Source: Ambulatory Visit | Attending: Nurse Practitioner | Admitting: Nurse Practitioner

## 2020-02-09 ENCOUNTER — Other Ambulatory Visit: Payer: Self-pay

## 2020-02-09 ENCOUNTER — Encounter: Payer: Self-pay | Admitting: Oncology

## 2020-02-09 ENCOUNTER — Inpatient Hospital Stay: Payer: Medicare HMO | Attending: Oncology | Admitting: Oncology

## 2020-02-09 DIAGNOSIS — Z87891 Personal history of nicotine dependence: Secondary | ICD-10-CM

## 2020-02-09 DIAGNOSIS — F17210 Nicotine dependence, cigarettes, uncomplicated: Secondary | ICD-10-CM

## 2020-02-09 NOTE — Progress Notes (Signed)
Virtual Visit via Video Note  I connected with Mr. Dudenhoeffer on 02/09/20 at  1:15 PM EDT by a video enabled telemedicine application and verified that I am speaking with the correct person using two identifiers.  Location: Patient: [facility] Provider: Office   I discussed the limitations of evaluation and management by telemedicine and the availability of in person appointments. The patient expressed understanding and agreed to proceed.  I discussed the assessment and treatment plan with the patient. The patient was provided an opportunity to ask questions and all were answered. The patient agreed with the plan and demonstrated an understanding of the instructions.   The patient was advised to call back or seek an in-person evaluation if the symptoms worsen or if the condition fails to improve as anticipated.   In accordance with CMS guidelines, patient has met eligibility criteria including age, absence of signs or symptoms of lung cancer.  Social History   Tobacco Use  . Smoking status: Current Every Day Smoker    Packs/day: 1.25    Years: 43.00    Pack years: 53.75    Types: Cigarettes  . Smokeless tobacco: Never Used  Substance Use Topics  . Alcohol use: No  . Drug use: No      A shared decision-making session was conducted prior to the performance of CT scan. This includes one or more decision aids, includes benefits and harms of screening, follow-up diagnostic testing, over-diagnosis, false positive rate, and total radiation exposure.   Counseling on the importance of adherence to annual lung cancer LDCT screening, impact of co-morbidities, and ability or willingness to undergo diagnosis and treatment is imperative for compliance of the program.   Counseling on the importance of continued smoking cessation for former smokers; the importance of smoking cessation for current smokers, and information about tobacco cessation interventions have been given to patient including [place] and [place] quit [place] programs.   Written order for lung cancer screening with LDCT has been given to the patient and any and all questions have been answered to the best of my abilities.    Yearly follow up will be coordinated by Burgess Estelle, Thoracic Navigator.  I provided 15 minutes of face-to-face video visit time during this encounter, and > 50% was spent counseling as documented under my assessment & plan.   Jacquelin Hawking, NP

## 2020-02-11 ENCOUNTER — Encounter: Payer: Self-pay | Admitting: *Deleted

## 2020-04-18 ENCOUNTER — Other Ambulatory Visit: Payer: Self-pay | Admitting: Family Medicine

## 2020-04-18 DIAGNOSIS — E119 Type 2 diabetes mellitus without complications: Secondary | ICD-10-CM

## 2020-05-03 ENCOUNTER — Other Ambulatory Visit: Payer: Self-pay | Admitting: Internal Medicine

## 2020-05-18 ENCOUNTER — Encounter: Payer: Self-pay | Admitting: Internal Medicine

## 2020-05-18 ENCOUNTER — Other Ambulatory Visit: Payer: Self-pay

## 2020-05-18 ENCOUNTER — Ambulatory Visit: Payer: Medicare HMO | Admitting: Internal Medicine

## 2020-05-18 VITALS — BP 120/80 | HR 81 | Ht 71.0 in | Wt 195.0 lb

## 2020-05-18 DIAGNOSIS — I2510 Atherosclerotic heart disease of native coronary artery without angina pectoris: Secondary | ICD-10-CM | POA: Diagnosis not present

## 2020-05-18 DIAGNOSIS — E1159 Type 2 diabetes mellitus with other circulatory complications: Secondary | ICD-10-CM

## 2020-05-18 DIAGNOSIS — E785 Hyperlipidemia, unspecified: Secondary | ICD-10-CM | POA: Diagnosis not present

## 2020-05-18 DIAGNOSIS — I255 Ischemic cardiomyopathy: Secondary | ICD-10-CM

## 2020-05-18 MED ORDER — PRALUENT 150 MG/ML ~~LOC~~ SOAJ: 1.0000 "pen " | SUBCUTANEOUS | 11 refills | Status: DC

## 2020-05-18 NOTE — Patient Instructions (Signed)
Medication Instructions:  Your physician recommends that you continue on your current medications as directed. Please refer to the Current Medication list given to you today.  Praluent has been refilled.  *If you need a refill on your cardiac medications before your next appointment, please call your pharmacy*   Lab Work: None ordered If you have labs (blood work) drawn today and your tests are completely normal, you will receive your results only by: Marland Kitchen MyChart Message (if you have MyChart) OR . A paper copy in the mail If you have any lab test that is abnormal or we need to change your treatment, we will call you to review the results.   Testing/Procedures: None ordered   Follow-Up: At [facility], you and your health needs are our priority.  As part of our continuing mission to provide you with exceptional heart care, we have created designated Provider Care Teams.  These Care Teams include your primary Cardiologist (physician) and Advanced Practice Providers (APPs -  Physician Assistants and Nurse Practitioners) who all work together to provide you with the care you need, when you need it.  We recommend signing up for the patient portal called "MyChart".  Sign up information is provided on this After Visit Summary.  MyChart is used to connect with patients for Virtual Visits (Telemedicine).  Patients are able to view lab/test results, encounter notes, upcoming appointments, etc.  Non-urgent messages can be sent to your provider as well.   To learn more about what you can do with MyChart, go to [email or web address].    Your next appointment:   12 month(s)  The format for your next appointment:   In Person  Provider:    You may see Yvonne Kendall, MD or one of the following Advanced Practice Providers on your designated Care Team:    Nicolasa Ducking, NP  Eula Listen, PA-C  Marisue Ivan, PA-C    Other Instructions N/A

## 2020-05-18 NOTE — Progress Notes (Signed)
Follow-up Outpatient Visit Date: 05/18/2020  Primary Care Provider: [name], FNP [address] [place] [address]  Chief Complaint: Follow-up coronary artery disease and hyperlipidemia  HPI:  John Mathis is a 59 y.o. male with history of CADwith report of 17 prior stent placements while living in [place], ischemic cardiomyopathy with LVEF of 30-35% by echo in 2017but found to be 50-55% in 10/2017, hypertension, hyperlipidemia, diabetes mellitus, and Lyme disease, who presents for follow-up of coronary artery disease.  I last saw him in January, at which time he was feeling well.  He saw Dr. Rennis Golden in March for follow-up of his probable familial hyperlipidemia.  Atorvastatin was decreased in the setting of excellent LDL control with addition of Praluent.  He is awaiting a refill of his Praluent, stating that authorization is needed.  He took his last dose about 3 weeks ago.  Today Mr. Matthews reports he has been feeling well.  He notes some intermittent heartburn precipitated by spicy foods.  It is responsive to Tums and famotidine.  This is different than what he has experienced in the past with angina.  He walks regularly without any limitations, denying chest pain, shortness of breath, palpitations, lightheadedness, and edema.  --------------------------------------------------------------------------------------------------  Cardiovascular History & Procedures: Cardiovascular Problems:  Coronary artery disease status post multiple PCI's  Chronic systolic heart failure secondary to ischemiccardiomyopathy  Risk Factors:  Known coronary artery disease, hypertension, hyperlipidemia, diabetes mellitus, male gender, and age greater than 73  Cath/PCI:  None available. Patient reports a total of 14 stents from 2012 through 2014  CV Surgery:  None  EP Procedures and Devices:  None  Non-Invasive Evaluation(s):  TTE (11/18/17): Mildly dilated LV with normal  wall thickness. LVEF 50-55% with normal wall motion and diastolic function. Normal RV size and function.  TTE ([facility]/18/16, [facility],[place], [place]):Normal LV size. LVEF 30-35% with inferior hypokinesis. Trace aortic and mild mitral regurgitation. Trace to mild tricuspid regurgitation. Normal RV size and function.  Recent CV Pertinent Labs: Lab Results  Component Value Date   CHOL 68 (L) 12/28/2019   HDL 33 (L) 12/28/2019   LDLCALC 5 12/28/2019   LDLDIRECT 179.0 12/17/2018   TRIG 195 (H) 12/28/2019   CHOLHDL 2.1 12/28/2019   CHOLHDL 2 06/15/2019   INR 1.00 05/19/2018   K 4.2 01/27/2020   BUN 11 01/27/2020   BUN 13 06/14/2017   CREATININE 0.87 01/27/2020    Past medical and surgical history were reviewed and updated in EPIC.  Current Meds  Medication Sig  . Alirocumab (PRALUENT) 150 MG/ML SOAJ Inject 1 pen into the skin every 14 (fourteen) days.  Marland Kitchen aspirin EC 81 MG tablet Take 81 mg by mouth daily.  Marland Kitchen atorvastatin (LIPITOR) 40 MG tablet Take 1 tablet (40 mg total) by mouth daily.  . clopidogrel (PLAVIX) 75 MG tablet TAKE 1 TABLET BY MOUTH EVERY DAY  . lisinopril (ZESTRIL) 5 MG tablet TAKE 1 TABLET BY MOUTH EVERY DAY  . metFORMIN (GLUCOPHAGE) 500 MG tablet TAKE 1 TABLET (500 MG TOTAL) BY MOUTH 2 (TWO) TIMES DAILY WITH A MEAL.  . metoprolol succinate (TOPROL-XL) 25 MG 24 hr tablet TAKE 1 TABLET BY MOUTH TWICE A DAY  . [DISCONTINUED] PRALUENT 150 MG/ML SOAJ INJECT 1 DOSE INTO THE SKIN EVERY 14 (FOURTEEN) DAYS.    Allergies: Ivp dye [iodinated diagnostic agents]  Social History   Tobacco Use  . Smoking status: Current Every Day Smoker    Packs/day: 1.25  Years: 43.00    Pack years: 53.75    Types: Cigarettes  . Smokeless tobacco: Never Used  Vaping Use  . Vaping Use: Never assessed  Substance Use Topics  . Alcohol use: No  . Drug use: No    Family History  Problem Relation Age of Onset  . Hyperlipidemia Mother   . Other Father         parasite infection  . Mental illness Maternal Grandmother     Review of Systems: A 12-system review of systems was performed and was negative except as noted in the HPI.  --------------------------------------------------------------------------------------------------  Physical Exam: BP 120/80 (BP Location: Left Arm, Patient Position: Sitting, Cuff Size: Normal)   Pulse 81   Ht 5\' 11"  (1.803 m)   Wt 195 lb (88.5 kg)   SpO2 96%   BMI 27.20 kg/m   General: NAD. Neck: No JVD or HJR. Lungs: Clear to auscultation without wheezes or crackles. Heart: Regular rate and rhythm without murmurs, rubs, or gallops. Abdomen: Soft, nontender, nondistended. Extremities: Right knee brace in place.  No lower extremity edema.  EKG: Normal sinus rhythm with inferior Q waves and nonspecific T wave changes.  No significant change from 11/18/2019.  Lab Results  Component Value Date   WBC 6.7 01/27/2020   HGB 17.0 01/27/2020   HCT 48.5 01/27/2020   MCV 90.1 01/27/2020   PLT 116.0 (L) 01/27/2020    Lab Results  Component Value Date   NA 136 01/27/2020   K 4.2 01/27/2020   CL 101 01/27/2020   CO2 30 01/27/2020   BUN 11 01/27/2020   CREATININE 0.87 01/27/2020   GLUCOSE 110 (H) 01/27/2020   ALT 18 06/15/2019    Lab Results  Component Value Date   CHOL 68 (L) 12/28/2019   HDL 33 (L) 12/28/2019   LDLCALC 5 12/28/2019   LDLDIRECT 179.0 12/17/2018   TRIG 195 (H) 12/28/2019   CHOLHDL 2.1 12/28/2019    --------------------------------------------------------------------------------------------------  ASSESSMENT AND PLAN: Coronary artery disease without angina: Mr. Enke continues to do well without symptoms to suggest worsening coronary insufficiency.  Given history of numerous stents, we will continue with indefinite DAPT as well as aggressive lipid therapy.  He declines a prescription for as needed sublingual nitroglycerin.  Ischemic cardiomyopathy: Mr. Bazinet appears euvolemic with  NYHA class I symptoms.  Continue current doses of metoprolol and lisinopril.  Hypertension: Blood pressure borderline today (goal < 130/80).  Continue current medications and lifestyle modifications.  Hyperlipidemia: LDL extremely well controlled, prompting recent de-escalation of atorvastatin.  We will send in a refill for Praluent today with plans to continue atorvastatin and Praluent at current doses.  Type 2 diabetes mellitus: Continue Metformin with ongoing management per PCP.  Follow-up: Return to clinic in 1 year.  Lindaann Slough, MD 05/18/2020 1:44 PM

## 2020-05-20 ENCOUNTER — Other Ambulatory Visit: Payer: Self-pay | Admitting: Internal Medicine

## 2020-05-26 ENCOUNTER — Other Ambulatory Visit: Payer: Self-pay | Admitting: Internal Medicine

## 2020-05-27 ENCOUNTER — Telehealth: Payer: Self-pay | Admitting: Internal Medicine

## 2020-05-27 NOTE — Telephone Encounter (Signed)
PA for praluent submitted via CMM Needed per Dr. Okey Dupre 05/18/20 note although our records indicate approved until 08/11/2020 Key: [id number] - PA Case ID: [id number] - Rx #: [id number]

## 2020-05-27 NOTE — Telephone Encounter (Addendum)
Outcome N/A today This medication or product was previously approved on [id number] from 08/12/2019 to 08/11/2020. You will be able to fill a prescription for this medication at your pharmacy. If your pharmacy has questions regarding the processing of your prescription, please have them call the [facility] pharmacy help desk at [phone].  Of note, Dr. Okey Dupre refilled medication on 05/18/20

## 2020-07-13 NOTE — Telephone Encounter (Signed)
PA submitted via CMM (Key: [id number])

## 2020-07-14 NOTE — Telephone Encounter (Signed)
Request Reference Number: [id number]. PRALUENT INJ 150MG /ML is approved through 07/13/2021

## 2020-08-16 ENCOUNTER — Encounter (INDEPENDENT_AMBULATORY_CARE_PROVIDER_SITE_OTHER): Payer: Self-pay

## 2020-08-19 ENCOUNTER — Other Ambulatory Visit: Payer: Self-pay | Admitting: Internal Medicine

## 2020-11-01 ENCOUNTER — Telehealth: Payer: Self-pay | Admitting: Internal Medicine

## 2020-11-01 DIAGNOSIS — E785 Hyperlipidemia, unspecified: Secondary | ICD-10-CM

## 2020-11-01 NOTE — Telephone Encounter (Signed)
1 year lipid clinic appointment due March 2022. Lipid panel ordered. Message sent to patient via MyChart

## 2020-12-12 ENCOUNTER — Telehealth: Payer: Self-pay | Admitting: Family Medicine

## 2020-12-12 NOTE — Telephone Encounter (Signed)
Can see for acute visit. With plan for new provider to come soon.

## 2020-12-12 NOTE — Telephone Encounter (Signed)
Patient is a Chief Strategy Officer patient . He is needing to see the cardiologist next month and needs some blood work and physical. The patient is wanting to stay with our practice. Would either of you be willing to take him on as a TOC?

## 2020-12-22 ENCOUNTER — Other Ambulatory Visit: Payer: Self-pay

## 2020-12-22 ENCOUNTER — Ambulatory Visit: Payer: Medicare HMO | Admitting: Family Medicine

## 2020-12-22 VITALS — BP 128/80 | HR 75 | Temp 97.3°F | Ht 71.0 in | Wt 203.2 lb

## 2020-12-22 DIAGNOSIS — Z720 Tobacco use: Secondary | ICD-10-CM | POA: Diagnosis not present

## 2020-12-22 DIAGNOSIS — E1169 Type 2 diabetes mellitus with other specified complication: Secondary | ICD-10-CM | POA: Diagnosis not present

## 2020-12-22 DIAGNOSIS — E119 Type 2 diabetes mellitus without complications: Secondary | ICD-10-CM | POA: Diagnosis not present

## 2020-12-22 DIAGNOSIS — E785 Hyperlipidemia, unspecified: Secondary | ICD-10-CM

## 2020-12-22 DIAGNOSIS — I10 Essential (primary) hypertension: Secondary | ICD-10-CM

## 2020-12-22 DIAGNOSIS — D696 Thrombocytopenia, unspecified: Secondary | ICD-10-CM | POA: Diagnosis not present

## 2020-12-22 LAB — HEMOGLOBIN A1C: Hgb A1c MFr Bld: 7.5 % — ABNORMAL HIGH (ref 4.6–6.5)

## 2020-12-22 LAB — LIPID PANEL
Cholesterol: 75 mg/dL (ref 0–200)
HDL: 35.5 mg/dL — ABNORMAL LOW (ref >39.00)
NonHDL: 39.67
Total CHOL/HDL Ratio: 2
Triglycerides: 223 mg/dL — ABNORMAL HIGH (ref 0.0–149.0)
VLDL: 44.6 mg/dL — ABNORMAL HIGH (ref 0.0–40.0)

## 2020-12-22 LAB — CBC
HCT: 49.2 % (ref 39.0–52.0)
Hemoglobin: 17.4 g/dL — ABNORMAL HIGH (ref 13.0–17.0)
MCHC: 35.3 g/dL (ref 30.0–36.0)
MCV: 90.3 fl (ref 78.0–100.0)
Platelets: 144 10*3/uL — ABNORMAL LOW (ref 150.0–400.0)
RBC: 5.44 Mil/uL (ref 4.22–5.81)
RDW: 13.5 % (ref 11.5–15.5)
WBC: 7 10*3/uL (ref 4.0–10.5)

## 2020-12-22 LAB — LDL CHOLESTEROL, DIRECT: Direct LDL: 19 mg/dL

## 2020-12-22 LAB — COMPREHENSIVE METABOLIC PANEL
ALT: 21 U/L (ref 0–53)
AST: 18 U/L (ref 0–37)
Albumin: 4.7 g/dL (ref 3.5–5.2)
Alkaline Phosphatase: 117 U/L (ref 39–117)
BUN: 13 mg/dL (ref 6–23)
CO2: 30 mEq/L (ref 19–32)
Calcium: 9.8 mg/dL (ref 8.4–10.5)
Chloride: 100 mEq/L (ref 96–112)
Creatinine, Ser: 0.75 mg/dL (ref 0.40–1.50)
GFR: 98.93 mL/min (ref >60.00)
Glucose, Bld: 143 mg/dL — ABNORMAL HIGH (ref 70–99)
Potassium: 3.7 mEq/L (ref 3.5–5.1)
Sodium: 139 mEq/L (ref 135–145)
Total Bilirubin: 1.1 mg/dL (ref 0.2–1.2)
Total Protein: 7.5 g/dL (ref 6.0–8.3)

## 2020-12-22 MED ORDER — METFORMIN HCL 500 MG PO TABS: 500.0000 mg | ORAL_TABLET | Freq: Two times a day (BID) | ORAL | 1 refills | Status: DC

## 2020-12-22 MED ORDER — METOPROLOL SUCCINATE ER 25 MG PO TB24: 25.0000 mg | ORAL_TABLET | Freq: Two times a day (BID) | ORAL | 1 refills | Status: DC

## 2020-12-22 NOTE — Assessment & Plan Note (Addendum)
Not currently ready to quit. Hoping to try again this spring. Lung cancer screening last year.

## 2020-12-22 NOTE — Patient Instructions (Addendum)
Reach out if you want to try and quit smoking   Get your eye exam

## 2020-12-22 NOTE — Progress Notes (Signed)
Subjective:     John Mathis is a 60 y.o. male presenting for blood work (For upcoming lipid and cardiologist appt )     HPI  #HTN - doing well no concerns  #tobacco - not ready to quit at this time time - failed chantix - nicotine patches were helpful but then stopped when stressful event  #HLD - improved with cardiology support - taking medications  #DM - Ok with diet - does not check sugars - feeling well   Review of Systems   Social History   Tobacco Use  Smoking Status Current Every Day Smoker  . Packs/day: 1.25  . Years: 43.00  . Pack years: 53.75  . Types: Cigarettes  Smokeless Tobacco Never Used        Objective:    BP Readings from Last 3 Encounters:  12/22/20 128/80  05/18/20 120/80  01/27/20 118/64   Wt Readings from Last 3 Encounters:  12/22/20 203 lb 4 oz (92.2 kg)  05/18/20 195 lb (88.5 kg)  02/09/20 195 lb (88.5 kg)    BP 128/80   Pulse 75   Temp (!) 97.3 F (36.3 C) (Temporal)   Ht 5\' 11"  (1.803 m)   Wt 203 lb 4 oz (92.2 kg)   SpO2 98%   BMI 28.35 kg/m    Physical Exam Constitutional:      Appearance: Normal appearance. He is not ill-appearing or diaphoretic.  HENT:     Right Ear: External ear normal.     Left Ear: External ear normal.     Nose: Nose normal.  Eyes:     General: No scleral icterus.    Extraocular Movements: Extraocular movements intact.     Conjunctiva/sclera: Conjunctivae normal.  Cardiovascular:     Rate and Rhythm: Normal rate and regular rhythm.     Heart sounds: No murmur heard.   Pulmonary:     Effort: Pulmonary effort is normal. No respiratory distress.     Breath sounds: Normal breath sounds. No wheezing.  Musculoskeletal:     Cervical back: Neck supple.  Skin:    General: Skin is warm and dry.  Neurological:     Mental Status: He is alert. Mental status is at baseline.  Psychiatric:        Mood and Affect: Mood normal.           Assessment & Plan:   Problem List Items  Addressed This Visit      Cardiovascular and Mediastinum   Essential hypertension - Primary    BP well controlled. Cont metoprolol 25 mg and lisinopril 5 mg      Relevant Medications   metoprolol succinate (TOPROL-XL) 25 MG 24 hr tablet   Other Relevant Orders   Comprehensive metabolic panel     Endocrine   Type 2 diabetes mellitus with other specified complication (HCC)    Tries to follow diet. Recheck hgb a1c. Cont metformin 500 mg BID      Relevant Medications   metFORMIN (GLUCOPHAGE) 500 MG tablet     Other   Dyslipidemia    Good control on last labs. Cont alirocumab and atorvastatin 40 mg        Relevant Orders   Comprehensive metabolic panel   Lipid panel   Tobacco abuse    Not currently ready to quit. Hoping to try again this spring. Lung cancer screening last year.       Thrombocytopenia (HCC)    Noted on previous labs. Will recheck.  Relevant Orders   CBC       Return in about 1 year (around 12/22/2021) for or sooner as needed.  Lynnda Child, MD  This visit occurred during the SARS-CoV-2 public health emergency.  Safety protocols were in place, including screening questions prior to the visit, additional usage of staff PPE, and extensive cleaning of exam room while observing appropriate contact time as indicated for disinfecting solutions.

## 2020-12-22 NOTE — Assessment & Plan Note (Signed)
Noted on previous labs  Will recheck

## 2020-12-22 NOTE — Assessment & Plan Note (Signed)
Tries to follow diet. Recheck hgb a1c. Cont metformin 500 mg BID

## 2020-12-22 NOTE — Assessment & Plan Note (Signed)
Good control on last labs. Cont alirocumab and atorvastatin 40 mg

## 2020-12-22 NOTE — Assessment & Plan Note (Signed)
BP well controlled. Cont metoprolol 25 mg and lisinopril 5 mg

## 2021-01-10 ENCOUNTER — Ambulatory Visit: Payer: Medicare HMO | Admitting: Internal Medicine

## 2021-01-10 ENCOUNTER — Other Ambulatory Visit: Payer: Self-pay

## 2021-01-10 ENCOUNTER — Encounter: Payer: Self-pay | Admitting: Internal Medicine

## 2021-01-10 VITALS — BP 118/66 | HR 74 | Ht 71.0 in | Wt 193.2 lb

## 2021-01-10 DIAGNOSIS — E781 Pure hyperglyceridemia: Secondary | ICD-10-CM | POA: Diagnosis not present

## 2021-01-10 DIAGNOSIS — I2510 Atherosclerotic heart disease of native coronary artery without angina pectoris: Secondary | ICD-10-CM

## 2021-01-10 DIAGNOSIS — E785 Hyperlipidemia, unspecified: Secondary | ICD-10-CM | POA: Diagnosis not present

## 2021-01-10 MED ORDER — ICOSAPENT ETHYL 1 G PO CAPS: 2.0000 g | ORAL_CAPSULE | Freq: Two times a day (BID) | ORAL | 11 refills | Status: DC

## 2021-01-10 NOTE — Patient Instructions (Addendum)
Medication Instructions:  START vascepa 2 capsules twice daily Continue all other current medications  Patient Assistance:  The [employer] foundation offers assistance to help pay for medication copays.  They will cover copays for all cholesterol lowering meds, including statins, fibrates, omega-3 oils, ezetimibe, Repatha, Praluent, Nexletol, Nexlizet.  The cards are usually good for $2,500 or 12 months, whichever comes first. 1. Go to healthwellfoundation.org 2. Click on "Apply Now" 3. Answer questions as to whom is applying (patient or representative) 4. Your disease fund will be "hypercholesterolemia - Medicare access" 5. They will ask questions about finances and which medications you are taking for cholesterol 6. When you submit, the approval is usually within minutes.  You will need to print the card information from the site 7. You will need to show this information to your pharmacy, they will bill your Medicare Part D plan first -then bill [employer] --for the copay.   You can also call them at [phone], although the hold times can be quite long.    *If you need a refill on your cardiac medications before your next appointment, please call your pharmacy*   Lab Work: FASTING lab work in 3-4 months -- complete about 1 week before your next appointment  If you have labs (blood work) drawn today and your tests are completely normal, you will receive your results only by: Marland Kitchen MyChart Message (if you have MyChart) OR . A paper copy in the mail If you have any lab test that is abnormal or we need to change your treatment, we will call you to review the results.   Testing/Procedures: NONE   Follow-Up: At [facility], you and your health needs are our priority.  As part of our continuing mission to provide you with exceptional heart care, we have created designated Provider Care Teams.  These Care Teams include your primary Cardiologist (physician) and Advanced Practice  Providers (APPs -  Physician Assistants and Nurse Practitioners) who all work together to provide you with the care you need, when you need it.  We recommend signing up for the patient portal called "MyChart".  Sign up information is provided on this After Visit Summary.  MyChart is used to connect with patients for Virtual Visits (Telemedicine).  Patients are able to view lab/test results, encounter notes, upcoming appointments, etc.  Non-urgent messages can be sent to your provider as well.   To learn more about what you can do with MyChart, go to [email or web address].    Your next appointment:   3-4 month(s)  The format for your next appointment:   In Person or Virtual  Provider:   Kirtland Bouchard [place] Hilty, MD   Other Instructions

## 2021-01-10 NOTE — Progress Notes (Signed)
OFFICE NOTE  Chief Complaint:  Follow-up dyslipidemia  Primary Care Physician: [name], FNP (Inactive)  HPI:  John Mathis is a 60 y.o. male with a past medial history significant for a history of coronary artery disease and reportedly 14 prior stent placements when living in [place].  He has an ischemic cardiomyopathy with LVEF 30 to 35% by echo in 2017 however it improved to 50 to 55% in 2019.  Other risk factors include hypertension, dyslipidemia, type 2 diabetes and a history of Lyme disease which was treated.  Not interested in quitting at this time.  Although he has discussed this before with Dr. Okey Dupre and was prescribed Chantix.  There is a strong family history of coronary disease, including coronary disease in his mother with marked dyslipidemia and her father as several of his mother's siblings and some of his siblings that have marked dyslipidemia.  He has 3 children, the 2 older children apparently have been tested for dyslipidemia and had no significant elevations.  Despite being on high intensity statin 80 mg daily, his most recent direct LDL was 179.,  His triglycerides were elevated 222 and his HDL is low at 30.  This metabolic profile is significant for someone perhaps with diabetic disease like he has or could suggest a combination of metabolic syndrome and familial hyperlipidemia or perhaps familial combined hyperlipidemia.  His untreated LDL would likely be much greater than 250 off of his statin.  Currently denies any chest pain or worsening shortness of breath.  07/07/2019  John Mathis is seen today in follow-up.  He reports some discomfort in injection site redness which persists for about a day after injection and does seem to improve.  He seems to be tolerating the Praluent otherwise and has had marked reduction in cholesterol.  His last lab work 3 weeks ago showed total cholesterol now 54 (reduced from 147), triglycerides 110, HDL 30 and LDL cholesterol of 2.   Based on his excellent response to his PCSK9 inhibitor, I feel we could likely decrease his statin dose to 40 mg of atorvastatin daily.  12/28/2019  John Mathis is seen today in follow-up.  He continues to do well on Praluent.  As his last LDL was 2, I advised him to decrease his atorvastatin from 80 to 40 mg daily.  He has not had repeat lipids and we will plan to get those today.  He was feeling some wooziness which she attributed to the high-dose statin however reducing the dose has improved those symptoms.  01/10/2021  John Mathis returns today for follow-up.  Overall he says he is feeling well.  His lipids have come down even further.  Total cholesterol now 75, triglycerides 223, HDL 35 and LDL 19.  This is on Praluent and high potency atorvastatin 40 mg daily.  His triglycerides have been quite refractory to treatment.  More recently his A1c is up a little higher at 7.5.  His PCP was trying to adjust his Metformin.  With his persistently elevated triglycerides, I think he has would be a good candidate for Vascepa especially given his prior coronary artery disease based on the data from the 2019 REDUCE-IT trial.  PMHx:  Past Medical History:  Diagnosis Date  . Arthritis   . CAD (coronary artery disease)    a. S/p multiple PCIs (report of 14 prior stents in [place]).  . CHF (congestive heart failure) (HCC)   . Depression   . Diabetes mellitus without complication (HCC)   .  Diverticulitis   . Familial hyperlipidemia    a. 02/2019 seen in lipid clinic-->Praluent started.  . Hypertension   . Ischemic cardiomyopathy    a. 01/18/2016 Echo [facility], [place] Nl LV size. EF 30-35% w/ inf HK. Trace AI, mild MR. Trace to mild TR. Nl RV size/fxn; b. 11/18/2017 Echo: Mildly dil LV w/ nl wall thickness. LVEF 50-55% w/ no rwma. Nl RV size/fxn.  . Lyme disease   . Myocardial infarction (HCC)    X2    Past Surgical History:  Procedure Laterality Date  . CARDIAC CATHETERIZATION  2012    x4 stents  . CARDIAC CATHETERIZATION  2014   x6 stents  . CARDIAC CATHETERIZATION  2014   x2 stents  . HERNIA REPAIR    . JOINT REPLACEMENT     partial then full  . KNEE SURGERY Right   . SHOULDER ARTHROSCOPY WITH OPEN ROTATOR CUFF REPAIR AND DISTAL CLAVICLE ACROMINECTOMY Left 05/22/2018   Procedure: SHOULDER ARTHROSCOPY WITH OPEN ROTATOR CUFF REPAIR, DISTAL CLAVICLE EXCISION, AND SUBACROMINAL DECOMPRESSION;  Surgeon: Juanell Fairly, MD;  Location: [facility] ORS;  Service: Orthopedics;  Laterality: Left;  . SMALL INTESTINE SURGERY    . TONSILLECTOMY      FAMHx:  Family History  Problem Relation Age of Onset  . Hyperlipidemia Mother   . Other Father        parasite infection  . Mental illness Maternal Grandmother     SOCHx:   reports that he has been smoking cigarettes. He has a 53.75 pack-year smoking history. He has never used smokeless tobacco. He reports that he does not drink alcohol and does not use drugs.  ALLERGIES:  Allergies  Allergen Reactions  . Ivp Dye [Iodinated Diagnostic Agents] Hives    ROS: Pertinent items noted in HPI and remainder of comprehensive ROS otherwise negative.  HOME MEDS: Current Outpatient Medications on File Prior to Visit  Medication Sig Dispense Refill  . Alirocumab (PRALUENT) 150 MG/ML SOAJ Inject 1 pen into the skin every 14 (fourteen) days. 2 pen 11  . aspirin EC 81 MG tablet Take 81 mg by mouth daily.    Marland Kitchen atorvastatin (LIPITOR) 40 MG tablet TAKE 1 TABLET BY MOUTH EVERY DAY 90 tablet 2  . clopidogrel (PLAVIX) 75 MG tablet TAKE 1 TABLET BY MOUTH EVERY DAY 90 tablet 3  . lisinopril (ZESTRIL) 5 MG tablet TAKE 1 TABLET BY MOUTH EVERY DAY 90 tablet 2  . metFORMIN (GLUCOPHAGE) 500 MG tablet Take 1 tablet (500 mg total) by mouth 2 (two) times daily with a meal. 180 tablet 1  . metoprolol succinate (TOPROL-XL) 25 MG 24 hr tablet Take 1 tablet (25 mg total) by mouth 2 (two) times daily. 180 tablet 1   No current facility-administered  medications on file prior to visit.    LABS/IMAGING: No results found for this or any previous visit (from the past 48 hour(s)). No results found.  LIPID PANEL:    Component Value Date/Time   CHOL 75 12/22/2020 1126   CHOL 68 (L) 12/28/2019 1516   TRIG 223.0 (H) 12/22/2020 1126   HDL 35.50 (L) 12/22/2020 1126   HDL 33 (L) 12/28/2019 1516   CHOLHDL 2 12/22/2020 1126   VLDL 44.6 (H) 12/22/2020 1126   LDLCALC 5 12/28/2019 1516   LDLDIRECT 19.0 12/22/2020 1126     WEIGHTS: Wt Readings from Last 3 Encounters:  01/10/21 193 lb 3.2 oz (87.6 kg)  12/22/20 203 lb 4 oz (92.2 kg)  05/18/20 195  lb (88.5 kg)    VITALS: BP 118/66   Pulse 74   Ht 5\' 11"  (1.803 m)   Wt 193 lb 3.2 oz (87.6 kg)   SpO2 97%   BMI 26.95 kg/m   EXAM: Deferred  EKG: Deferred  ASSESSMENT: 1. 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 Type 2 diabetes 3. Hypertension 4. Coronary artery disease with numerous prior stents 5. Ischemic cardiomyopathy with improved LVEF up to 50 to 55% (2019) 6. Tobacco abuse  PLAN: 1.   John Mathis wanted well to both Praluent and atorvastatin.  His LDL is well treated.  Overall cholesterol is low but his triglycerides remain elevated.  This is an important secondary risk factor as shown in the 2019 REDUCE-IT trial.  I would recommend adding Vascepa 2 g twice daily.  He hopefully could apply for the health well grant as he will need to take the brand-name medication.  The generic does not have indication for cardiovascular risk reduction.  Plan repeat lipids in 3 months fasting and follow-up with me at that time in person or virtually.  Thanks again for allowing me to participate in his care  2020, MD, [facility], FACP  [place]  [facility]  Medical Director of the [facility] Lipid Disorders &  Cardiovascular Risk Reduction Clinic Diplomate of the American Board of Clinical Lipidology Attending Cardiologist  Direct Dial:  [phone]  Fax: [phone]  Website:  [phone] [place] 01/10/2021, 9:35 AM

## 2021-01-12 ENCOUNTER — Telehealth: Payer: Self-pay | Admitting: Internal Medicine

## 2021-01-12 NOTE — Telephone Encounter (Signed)
Request Reference Number: [id number]. ICOSAPENT CAP 1GM is approved through 01/12/2022

## 2021-01-12 NOTE — Telephone Encounter (Signed)
Vascepa PA started through covermymeds. Key: [id number]

## 2021-01-12 NOTE — Telephone Encounter (Signed)
PA for vascepa 1 gram capsules Key: [id number]

## 2021-01-12 NOTE — Telephone Encounter (Signed)
Patient has been approved for a co-pay assistance grant from health well foundation Fund: hypercholesterolemia - medicare access $2500 from 12/11/2020 - 12/10/2021  ID: [id number] PC Group: [id number] PC BIN: [id number] PC PCN: [id number] PC Processor: PDMI

## 2021-01-13 ENCOUNTER — Other Ambulatory Visit: Payer: Self-pay | Admitting: Internal Medicine

## 2021-01-13 ENCOUNTER — Telehealth: Payer: Self-pay

## 2021-01-20 ENCOUNTER — Other Ambulatory Visit: Payer: Self-pay | Admitting: *Deleted

## 2021-01-20 DIAGNOSIS — F17200 Nicotine dependence, unspecified, uncomplicated: Secondary | ICD-10-CM

## 2021-01-20 DIAGNOSIS — Z87891 Personal history of nicotine dependence: Secondary | ICD-10-CM

## 2021-01-20 DIAGNOSIS — Z122 Encounter for screening for malignant neoplasm of respiratory organs: Secondary | ICD-10-CM

## 2021-01-20 NOTE — Progress Notes (Signed)
Contacted and scheduled for annual lung screening scan. Patient is a current smoker with a 55 pack year history. 

## 2021-02-07 LAB — LIPID PANEL
Chol/HDL Ratio: 4.6 ratio (ref 0.0–5.0)
Cholesterol, Total: 148 mg/dL (ref 100–199)
HDL: 32 mg/dL — ABNORMAL LOW (ref >39)
LDL Chol Calc (NIH): 86 mg/dL (ref 0–99)
Triglycerides: 173 mg/dL — ABNORMAL HIGH (ref 0–149)
VLDL Cholesterol Cal: 30 mg/dL (ref 5–40)

## 2021-02-10 ENCOUNTER — Other Ambulatory Visit: Payer: Self-pay

## 2021-02-10 ENCOUNTER — Ambulatory Visit
Admission: RE | Admit: 2021-02-10 | Discharge: 2021-02-10 | Disposition: A | Discharge status: Home / Self Care | Payer: Medicare HMO | Source: Ambulatory Visit | Attending: Oncology | Admitting: Oncology

## 2021-02-10 DIAGNOSIS — F17200 Nicotine dependence, unspecified, uncomplicated: Secondary | ICD-10-CM | POA: Insufficient documentation

## 2021-02-10 DIAGNOSIS — Z122 Encounter for screening for malignant neoplasm of respiratory organs: Secondary | ICD-10-CM

## 2021-02-10 DIAGNOSIS — Z87891 Personal history of nicotine dependence: Secondary | ICD-10-CM | POA: Insufficient documentation

## 2021-02-16 ENCOUNTER — Encounter: Payer: Self-pay | Admitting: *Deleted

## 2021-02-17 ENCOUNTER — Encounter: Payer: Self-pay | Admitting: Family Medicine

## 2021-02-17 DIAGNOSIS — J439 Emphysema, unspecified: Secondary | ICD-10-CM | POA: Insufficient documentation

## 2021-02-17 DIAGNOSIS — I700 Atherosclerosis of aorta: Secondary | ICD-10-CM | POA: Insufficient documentation

## 2021-05-12 LAB — LIPID PANEL
Chol/HDL Ratio: 2.8 ratio (ref 0.0–5.0)
Cholesterol, Total: 90 mg/dL — ABNORMAL LOW (ref 100–199)
HDL: 32 mg/dL — ABNORMAL LOW (ref >39)
LDL Chol Calc (NIH): 26 mg/dL (ref 0–99)
Triglycerides: 205 mg/dL — ABNORMAL HIGH (ref 0–149)
VLDL Cholesterol Cal: 32 mg/dL (ref 5–40)

## 2021-05-15 ENCOUNTER — Telehealth: Payer: Medicare HMO | Admitting: Internal Medicine

## 2021-05-23 LAB — HM DIABETES EYE EXAM

## 2021-05-24 ENCOUNTER — Telehealth: Payer: Self-pay | Admitting: Internal Medicine

## 2021-05-24 ENCOUNTER — Other Ambulatory Visit: Payer: Self-pay

## 2021-05-24 ENCOUNTER — Telehealth (HOSPITAL_BASED_OUTPATIENT_CLINIC_OR_DEPARTMENT_OTHER): Payer: Medicare HMO | Admitting: Internal Medicine

## 2021-05-24 NOTE — Telephone Encounter (Signed)
No changes- would push out appt for 6 months  Dr Rexene Edison

## 2021-05-24 NOTE — Telephone Encounter (Signed)
Have attempted to contact patient numerous times and phone rings once and then goes to voicemail. Sent patient a Wellsite geologist with [facility] office number to call or he can be transferred to this site instead of message going thru triage.

## 2021-05-24 NOTE — Telephone Encounter (Signed)
Pt is calling in regards to his Video call with Dr. Rennis Golden today at 3pm, please contact patient at [phone]

## 2021-05-25 ENCOUNTER — Other Ambulatory Visit: Payer: Self-pay

## 2021-05-25 ENCOUNTER — Other Ambulatory Visit: Payer: Self-pay | Admitting: Internal Medicine

## 2021-05-25 ENCOUNTER — Ambulatory Visit (INDEPENDENT_AMBULATORY_CARE_PROVIDER_SITE_OTHER): Payer: Medicare HMO | Admitting: Nurse Practitioner

## 2021-05-25 ENCOUNTER — Encounter: Payer: Self-pay | Admitting: Nurse Practitioner

## 2021-05-25 VITALS — BP 132/84 | HR 72 | Temp 98.0°F | Resp 20 | Ht 70.25 in | Wt 188.5 lb

## 2021-05-25 DIAGNOSIS — Z7689 Persons encountering health services in other specified circumstances: Secondary | ICD-10-CM | POA: Diagnosis not present

## 2021-05-25 DIAGNOSIS — Z720 Tobacco use: Secondary | ICD-10-CM | POA: Diagnosis not present

## 2021-05-25 DIAGNOSIS — E785 Hyperlipidemia, unspecified: Secondary | ICD-10-CM | POA: Diagnosis not present

## 2021-05-25 DIAGNOSIS — R911 Solitary pulmonary nodule: Secondary | ICD-10-CM | POA: Diagnosis not present

## 2021-05-25 NOTE — Progress Notes (Signed)
Established Patient Office Visit  Subjective:  Patient ID: John Mathis, male    DOB: [birth year]  Age: 60 y.o. MRN: [id number]  CC:  Chief Complaint  Patient presents with   Transfer of Care    From [facility]    HPI John Mathis presents for transfer of care.    Diabetes mellitus: Patient currently on metformin 500 mg 3 times daily per patient report states he takes a total of 1500 mg daily for diabetes.  Does not check his sugar at home.  States previous provider wanted him to take 2000 mg total a day but patient was unable to tolerate due to GI adverse events (diarrhea).  Tobacco use: 1.25 ppd 43 years worth smoking.  Patient does not have interest in quitting currently has been adherent with CT chest for lung cancer screening last 1 performed on 02-13-2021.  Scan did showed a benign appearing nodule.  Measuring 2.3 mm in size located in the left upper lobe.  Hyperlipidemia: Patient is managed by Dr. Rennis Golden.  Currently taking Alirocumab and atorvastatin.  Lipid panel was 05-11-2021 through Dr. Blanchie Dessert office.     Past Medical History:  Diagnosis Date   Arthritis    CAD (coronary artery disease)    a. S/p multiple PCIs (report of 14 prior stents in [place]).   CHF (congestive heart failure) (HCC)    Depression    Diabetes mellitus without complication (HCC)    Diverticulitis    Familial hyperlipidemia    a. 02/2019 seen in lipid clinic-->Praluent started.   Hypertension    Ischemic cardiomyopathy    a. 01/18/2016 Echo [facility], [place] Nl LV size. EF 30-35% w/ inf HK. Trace AI, mild MR. Trace to mild TR. Nl RV size/fxn; b. 11/18/2017 Echo: Mildly dil LV w/ nl wall thickness. LVEF 50-55% w/ no rwma. Nl RV size/fxn.   Lyme disease    Myocardial infarction (HCC)    X2    Past Surgical History:  Procedure Laterality Date   CARDIAC CATHETERIZATION  2012   x4 stents   CARDIAC CATHETERIZATION  2014   x6 stents   CARDIAC CATHETERIZATION  2014    x2 stents   HERNIA REPAIR     JOINT REPLACEMENT     partial then full   KNEE SURGERY Right    SHOULDER ARTHROSCOPY WITH OPEN ROTATOR CUFF REPAIR AND DISTAL CLAVICLE ACROMINECTOMY Left 05/22/2018   Procedure: SHOULDER ARTHROSCOPY WITH OPEN ROTATOR CUFF REPAIR, DISTAL CLAVICLE EXCISION, AND SUBACROMINAL DECOMPRESSION;  Surgeon: Juanell Fairly, MD;  Location: [facility] ORS;  Service: Orthopedics;  Laterality: Left;   SMALL INTESTINE SURGERY     TONSILLECTOMY      Family History  Problem Relation Age of Onset   Hyperlipidemia Mother    Other Father        parasite infection   Mental illness Maternal Grandmother     Social History   Socioeconomic History   Marital status: Widowed    Spouse name: Not on file   Number of children: Not on file   Years of education: Not on file   Highest education level: Not on file  Occupational History   Not on file  Tobacco Use   Smoking status: Every Day    Packs/day: 1.25    Years: 43.00    Pack years: 53.75    Types: Cigarettes   Smokeless tobacco: Never  Vaping Use   Vaping Use: Not on file  Substance and Sexual Activity   Alcohol  use: No   Drug use: No   Sexual activity: Not on file  Other Topics Concern   Not on file  Social History Narrative   Not on file   Social Determinants of Health   Financial Resource Strain: Not on file  Food Insecurity: Not on file  Transportation Needs: Not on file  Physical Activity: Not on file  Stress: Not on file  Social Connections: Not on file  Intimate Partner Violence: Not on file    Outpatient Medications Prior to Visit  Medication Sig Dispense Refill   Alirocumab (PRALUENT) 150 MG/ML SOAJ Inject 1 pen into the skin every 14 (fourteen) days. 2 pen 11   aspirin EC 81 MG tablet Take 81 mg by mouth daily.     atorvastatin (LIPITOR) 40 MG tablet TAKE 1 TABLET BY MOUTH EVERY DAY 90 tablet 1   clopidogrel (PLAVIX) 75 MG tablet TAKE 1 TABLET BY MOUTH EVERY DAY 90 tablet 3   icosapent Ethyl  (VASCEPA) 1 g capsule Take 2 capsules (2 g total) by mouth 2 (two) times daily. 120 capsule 11   lisinopril (ZESTRIL) 5 MG tablet TAKE 1 TABLET BY MOUTH EVERY DAY 90 tablet 2   metFORMIN (GLUCOPHAGE) 500 MG tablet Take 1 tablet (500 mg total) by mouth 2 (two) times daily with a meal. 180 tablet 1   metoprolol succinate (TOPROL-XL) 25 MG 24 hr tablet Take 1 tablet (25 mg total) by mouth 2 (two) times daily. 180 tablet 1   No facility-administered medications prior to visit.    Allergies  Allergen Reactions   Ivp Dye [Iodinated Diagnostic Agents] Hives    ROS Review of Systems  Constitutional:  Negative for chills and fever.  Respiratory:  Negative for shortness of breath.   Cardiovascular:  Negative for chest pain and leg swelling.  Gastrointestinal:  Negative for abdominal pain, constipation, diarrhea, nausea and vomiting.  Skin:  Negative for color change.     Objective:    Physical Exam Constitutional:      Appearance: Normal appearance.  Neck:     Thyroid: No thyroid mass or thyromegaly.     Vascular: No carotid bruit.  Cardiovascular:     Rate and Rhythm: Normal rate and regular rhythm.  Pulmonary:     Effort: Pulmonary effort is normal.     Breath sounds: Normal breath sounds.  Abdominal:     General: Bowel sounds are normal.  Musculoskeletal:     Cervical back: Neck supple.  Lymphadenopathy:     Cervical: No cervical adenopathy.  Skin:    General: Skin is warm.  Neurological:     Mental Status: He is alert.  Psychiatric:        Mood and Affect: Mood normal.        Judgment: Judgment normal.    BP 132/84   Pulse 72   Temp 98 F (36.7 C)   Resp 20   Ht 5' 10.25" (1.784 m)   Wt 188 lb 8 oz (85.5 kg)   SpO2 97%   BMI 26.85 kg/m  Wt Readings from Last 3 Encounters:  05/25/21 188 lb 8 oz (85.5 kg)  02/10/21 190 lb (86.2 kg)  01/10/21 193 lb 3.2 oz (87.6 kg)     Health Maintenance Due  Topic Date Due   COVID-19 Vaccine (1) Never done    OPHTHALMOLOGY EXAM  Never done   TETANUS/TDAP  Never done   COLONOSCOPY (Pts 45-37yrs Insurance coverage will need to be confirmed)  Never done  Zoster Vaccines- Shingrix (1 of 2) Never done   Pneumococcal Vaccine 41-13 Years old (2 - PCV) 06/14/2020   FOOT EXAM  06/14/2020    There are no preventive care reminders to display for this patient.  No results found for: TSH Lab Results  Component Value Date   WBC 7.0 12/22/2020   HGB 17.4 (H) 12/22/2020   HCT 49.2 12/22/2020   MCV 90.3 12/22/2020   PLT 144.0 (L) 12/22/2020   Lab Results  Component Value Date   NA 139 12/22/2020   K 3.7 12/22/2020   CO2 30 12/22/2020   GLUCOSE 143 (H) 12/22/2020   BUN 13 12/22/2020   CREATININE 0.75 12/22/2020   BILITOT 1.1 12/22/2020   ALKPHOS 117 12/22/2020   AST 18 12/22/2020   ALT 21 12/22/2020   PROT 7.5 12/22/2020   ALBUMIN 4.7 12/22/2020   CALCIUM 9.8 12/22/2020   ANIONGAP 7 05/19/2018   GFR 98.93 12/22/2020   Lab Results  Component Value Date   CHOL 90 (L) 05/11/2021   Lab Results  Component Value Date   HDL 32 (L) 05/11/2021   Lab Results  Component Value Date   LDLCALC 26 05/11/2021   Lab Results  Component Value Date   TRIG 205 (H) 05/11/2021   Lab Results  Component Value Date   CHOLHDL 2.8 05/11/2021   Lab Results  Component Value Date   HGBA1C 7.5 (H) 12/22/2020      Assessment & Plan:   Problem List Items Addressed This Visit       Other   Tobacco abuse   Hyperlipidemia LDL goal <70    Patient managed by lipid specialist.  Per report was seen patient every 6 months and he has now graduated to once a year.  Last lipid panel was 7- 14-20 22. Continue alirocumab and atorvastatin.       Establishing care with new doctor, encounter for - Primary    Patient transferring care from previous care provider in office.  Last seen in our office February 2022.  Last full set of labs was then patient is not fasting today and we discussed following up in 1 month  for CPE and fasting labs.  No acute complaints today just wants to establish with new healthcare provider. Was able to review the electronic medical record.  Patient does not need any medication refills continue medication as prescribed.  We will need to catch patient up on vaccinations and diabetic foot exam.       Pulmonary nodule    Patient is a longtime smoker.  1.25 pdd for approximately 43 years.  Recently had a low-dose CT scan for lung cancer surveillance stable in comparison with 1 prior.  Did review the report personally.  Nodules 2.3 mm in left upper lobe.  To take notice of aortic atherosclerosis.  Recommendation follow-up low-dose CT scan in 1 year which will be around April 2023.        No orders of the defined types were placed in this encounter.   Follow-up: Return in about 1 month (around 06/25/2021) for CPE and FASTING labs. Please let him have the mornig appt around his schedule.    Audria Nine, NP

## 2021-05-25 NOTE — Assessment & Plan Note (Signed)
Patient managed by lipid specialist.  Per report was seen patient every 6 months and he has now graduated to once a year.  Last lipid panel was 7- 14-20 22. Continue alirocumab and atorvastatin.

## 2021-05-25 NOTE — Telephone Encounter (Signed)
MyChart message sent to patient. He will follow up in about 6 months & complete fasting labs prior. He had some issues with Vascepa that were relayed to MD via MyChart message to be followed up on

## 2021-05-25 NOTE — Patient Instructions (Signed)
Continue your medications as prescribed Follow up with me in 1 month for CPE and FASTING labs. Reach out with any questions or concerns

## 2021-05-25 NOTE — Assessment & Plan Note (Signed)
Patient is a longtime smoker.  1.25 pdd for approximately 43 years.  Recently had a low-dose CT scan for lung cancer surveillance stable in comparison with 1 prior.  Did review the report personally.  Nodules 2.3 mm in left upper lobe.  To take notice of aortic atherosclerosis.  Recommendation follow-up low-dose CT scan in 1 year which will be around April 2023.

## 2021-05-25 NOTE — Assessment & Plan Note (Addendum)
Patient transferring care from previous care provider in office.  Last seen in our office February 2022.  Last full set of labs was then patient is not fasting today and we discussed following up in 1 month for CPE and fasting labs.  No acute complaints today just wants to establish with new healthcare provider. Was able to review the electronic medical record.  Patient does not need any medication refills continue medication as prescribed.  We will need to catch patient up on vaccinations and diabetic foot exam.

## 2021-06-01 ENCOUNTER — Encounter: Payer: Self-pay | Admitting: Optometry

## 2021-06-15 ENCOUNTER — Other Ambulatory Visit: Payer: Self-pay | Admitting: Internal Medicine

## 2021-06-15 NOTE — Telephone Encounter (Signed)
Please schedule 12 month F/U appointment. Thank you! 

## 2021-06-22 ENCOUNTER — Telehealth: Payer: Self-pay | Admitting: Internal Medicine

## 2021-06-22 NOTE — Telephone Encounter (Signed)
Praluent prior auth submitted via CMM (Key: [id number])

## 2021-06-23 NOTE — Telephone Encounter (Signed)
Request Reference Number: [id number]. PRALUENT INJ 150MG /ML is approved through 06/22/2022

## 2021-06-26 ENCOUNTER — Other Ambulatory Visit: Payer: Medicare HMO

## 2021-06-27 ENCOUNTER — Other Ambulatory Visit: Payer: Self-pay | Admitting: Family Medicine

## 2021-06-27 ENCOUNTER — Other Ambulatory Visit: Payer: Self-pay

## 2021-06-27 ENCOUNTER — Encounter: Payer: Self-pay | Admitting: Nurse Practitioner

## 2021-06-27 ENCOUNTER — Ambulatory Visit (INDEPENDENT_AMBULATORY_CARE_PROVIDER_SITE_OTHER): Payer: Medicare HMO | Admitting: Nurse Practitioner

## 2021-06-27 VITALS — BP 122/68 | HR 60 | Temp 97.9°F | Resp 12 | Ht 70.25 in | Wt 187.8 lb

## 2021-06-27 DIAGNOSIS — I10 Essential (primary) hypertension: Secondary | ICD-10-CM

## 2021-06-27 DIAGNOSIS — E119 Type 2 diabetes mellitus without complications: Secondary | ICD-10-CM

## 2021-06-27 DIAGNOSIS — E785 Hyperlipidemia, unspecified: Secondary | ICD-10-CM | POA: Diagnosis not present

## 2021-06-27 DIAGNOSIS — E1169 Type 2 diabetes mellitus with other specified complication: Secondary | ICD-10-CM | POA: Diagnosis not present

## 2021-06-27 DIAGNOSIS — Z0000 Encounter for general adult medical examination without abnormal findings: Secondary | ICD-10-CM | POA: Diagnosis not present

## 2021-06-27 LAB — LIPID PANEL
Cholesterol: 77 mg/dL (ref 0–200)
HDL: 31.7 mg/dL — ABNORMAL LOW (ref >39.00)
NonHDL: 45.16
Total CHOL/HDL Ratio: 2
Triglycerides: 213 mg/dL — ABNORMAL HIGH (ref 0.0–149.0)
VLDL: 42.6 mg/dL — ABNORMAL HIGH (ref 0.0–40.0)

## 2021-06-27 LAB — COMPREHENSIVE METABOLIC PANEL
ALT: 15 U/L (ref 0–53)
AST: 14 U/L (ref 0–37)
Albumin: 4.5 g/dL (ref 3.5–5.2)
Alkaline Phosphatase: 126 U/L — ABNORMAL HIGH (ref 39–117)
BUN: 10 mg/dL (ref 6–23)
CO2: 29 mEq/L (ref 19–32)
Calcium: 9.8 mg/dL (ref 8.4–10.5)
Chloride: 100 mEq/L (ref 96–112)
Creatinine, Ser: 0.82 mg/dL (ref 0.40–1.50)
GFR: 95.95 mL/min (ref >60.00)
Glucose, Bld: 117 mg/dL — ABNORMAL HIGH (ref 70–99)
Potassium: 4.6 mEq/L (ref 3.5–5.1)
Sodium: 135 mEq/L (ref 135–145)
Total Bilirubin: 0.9 mg/dL (ref 0.2–1.2)
Total Protein: 7.1 g/dL (ref 6.0–8.3)

## 2021-06-27 LAB — LDL CHOLESTEROL, DIRECT: Direct LDL: 22 mg/dL

## 2021-06-27 LAB — CBC
HCT: 50.3 % (ref 39.0–52.0)
Hemoglobin: 17.5 g/dL — ABNORMAL HIGH (ref 13.0–17.0)
MCHC: 34.7 g/dL (ref 30.0–36.0)
MCV: 89.5 fl (ref 78.0–100.0)
Platelets: 171 10*3/uL (ref 150.0–400.0)
RBC: 5.61 Mil/uL (ref 4.22–5.81)
RDW: 13.2 % (ref 11.5–15.5)
WBC: 6.3 10*3/uL (ref 4.0–10.5)

## 2021-06-27 LAB — MICROALBUMIN / CREATININE URINE RATIO
Creatinine,U: 272 mg/dL
Microalb Creat Ratio: 0.5 mg/g (ref 0.0–30.0)
Microalb, Ur: 1.4 mg/dL (ref 0.0–1.9)

## 2021-06-27 LAB — PSA, MEDICARE: PSA: 0.47 ng/ml (ref 0.10–4.00)

## 2021-06-27 LAB — HEMOGLOBIN A1C: Hgb A1c MFr Bld: 6.9 % — ABNORMAL HIGH (ref 4.6–6.5)

## 2021-06-27 LAB — TSH: TSH: 2.45 u[IU]/mL (ref 0.35–5.50)

## 2021-06-27 NOTE — Assessment & Plan Note (Signed)
BP within goal today. Managed by cariology. Continue Metoprolol and lisinopril

## 2021-06-27 NOTE — Patient Instructions (Addendum)
I will be in touch with your lab results. Follow up with me in 6 months, sooner if needed Call and reschedule you colonoscopy

## 2021-06-27 NOTE — Assessment & Plan Note (Addendum)
Will check A1c and Urine/creatinine ratio in office today. Pending lab work Continue metformin 1500mg  total daily  We will have patient follow-up in 6 months pending A1c.  Previous A1c's have been well controlled most recent was above goal at 7.5 depending on today's results depends on whether he has to come back in 3 or 6 months.  Did discuss this with patient he is in agreement with plan.

## 2021-06-27 NOTE — Progress Notes (Signed)
Established Patient Office Visit  Subjective:  Patient ID: John Mathis, male    DOB: [birth year]  Age: 60 y.o. MRN: [id number]  CC:  Chief Complaint  Patient presents with   Annual Exam    HPI John Mathis presents  for complete physical and follow up of chronic conditions.  Immunizations: -Tetanus: ? -Influenza: Declined -Covid-19: two doses -Shingles:  refused -Pneumonia: pna 23  -HPV: na  Diet: Fair diet. 3 times daily Exercise:  walks 2 miles daily with the dogs  Eye exam: Completed last month Dental exam: Completes semi-annually     Colonoscopy: Completed in approx 7 years [facility] PSA: Due  Lung Cancer Screening: Completed in  01/2021  Past Medical History:  Diagnosis Date   Arthritis    CAD (coronary artery disease)    a. S/p multiple PCIs (report of 14 prior stents in [place]).   CHF (congestive heart failure) (HCC)    Depression    Diabetes mellitus without complication (HCC)    Diverticulitis    Familial hyperlipidemia    a. 02/2019 seen in lipid clinic-->Praluent started.   Hypertension    Ischemic cardiomyopathy    a. 01/18/2016 Echo [facility] [place]): Nl LV size. EF 30-35% w/ inf HK. Trace AI, mild MR. Trace to mild TR. Nl RV size/fxn; b. 11/18/2017 Echo: Mildly dil LV w/ nl wall thickness. LVEF 50-55% w/ no rwma. Nl RV size/fxn.   Lyme disease    Myocardial infarction (HCC)    X2    Past Surgical History:  Procedure Laterality Date   CARDIAC CATHETERIZATION  2012   x4 stents   CARDIAC CATHETERIZATION  2014   x6 stents   CARDIAC CATHETERIZATION  2014   x2 stents   HERNIA REPAIR     JOINT REPLACEMENT     partial then full   KNEE SURGERY Right    SHOULDER ARTHROSCOPY WITH OPEN ROTATOR CUFF REPAIR AND DISTAL CLAVICLE ACROMINECTOMY Left 05/22/2018   Procedure: SHOULDER ARTHROSCOPY WITH OPEN ROTATOR CUFF REPAIR, DISTAL CLAVICLE EXCISION, AND SUBACROMINAL DECOMPRESSION;  Surgeon: Juanell Fairly, MD;  Location:  [facility] ORS;  Service: Orthopedics;  Laterality: Left;   SMALL INTESTINE SURGERY     TONSILLECTOMY      Family History  Problem Relation Age of Onset   Hyperlipidemia Mother    Other Father        parasite infection   Mental illness Maternal Grandmother     Social History   Socioeconomic History   Marital status: Widowed    Spouse name: Not on file   Number of children: Not on file   Years of education: Not on file   Highest education level: Not on file  Occupational History   Not on file  Tobacco Use   Smoking status: Every Day    Packs/day: 1.25    Years: 43.00    Pack years: 53.75    Types: Cigarettes   Smokeless tobacco: Never  Vaping Use   Vaping Use: Not on file  Substance and Sexual Activity   Alcohol use: No   Drug use: No   Sexual activity: Not on file  Other Topics Concern   Not on file  Social History Narrative   Not on file   Social Determinants of Health   Financial Resource Strain: Not on file  Food Insecurity: Not on file  Transportation Needs: Not on file  Physical Activity: Not on file  Stress: Not on file  Social Connections: Not on file  Intimate Partner Violence: Not on file    Outpatient Medications Prior to Visit  Medication Sig Dispense Refill   aspirin EC 81 MG tablet Take 81 mg by mouth daily.     atorvastatin (LIPITOR) 40 MG tablet TAKE 1 TABLET BY MOUTH EVERY DAY 90 tablet 1   clopidogrel (PLAVIX) 75 MG tablet TAKE 1 TABLET BY MOUTH EVERY DAY 90 tablet 3   lisinopril (ZESTRIL) 5 MG tablet TAKE 1 TABLET BY MOUTH EVERY DAY 90 tablet 2   metFORMIN (GLUCOPHAGE) 500 MG tablet Take 1 tablet (500 mg total) by mouth 2 (two) times daily with a meal. 180 tablet 1   metoprolol succinate (TOPROL-XL) 25 MG 24 hr tablet Take 1 tablet (25 mg total) by mouth 2 (two) times daily. 180 tablet 1   PRALUENT 150 MG/ML SOAJ INJECT 1 PEN INTO THE SKIN EVERY 14 (FOURTEEN) DAYS. 2 mL 0   icosapent Ethyl (VASCEPA) 1 g capsule Take 2 capsules (2 g total) by  mouth 2 (two) times daily. (Patient not taking: Reported on 06/27/2021) 120 capsule 11   No facility-administered medications prior to visit.    Allergies  Allergen Reactions   Ivp Dye [Iodinated Diagnostic Agents] Hives    ROS Review of Systems  Constitutional:  Negative for chills, fatigue and fever.  Respiratory:  Negative for shortness of breath.   Cardiovascular:  Negative for chest pain and leg swelling.  Gastrointestinal:  Negative for abdominal pain, blood in stool, constipation, diarrhea, nausea and vomiting.  Genitourinary:  Negative for dysuria, hematuria, penile discharge, penile pain, penile swelling and scrotal swelling.       Nocturia zero times  Neurological:  Negative for dizziness, weakness and numbness.  Psychiatric/Behavioral:  Negative for hallucinations and suicidal ideas.      Objective:    Physical Exam Vitals and nursing note reviewed.  Constitutional:      Appearance: Normal appearance.  HENT:     Right Ear: Tympanic membrane, ear canal and external ear normal. There is no impacted cerumen.     Left Ear: Tympanic membrane, ear canal and external ear normal. There is no impacted cerumen.     Mouth/Throat:     Mouth: Mucous membranes are moist.     Pharynx: Oropharynx is clear.  Eyes:     Extraocular Movements: Extraocular movements intact.     Pupils: Pupils are equal, round, and reactive to light.  Neck:     Thyroid: No thyroid mass, thyromegaly or thyroid tenderness.     Vascular: No carotid bruit.  Cardiovascular:     Rate and Rhythm: Normal rate and regular rhythm.     Pulses:          Radial pulses are 2+ on the right side and 2+ on the left side.       Dorsalis pedis pulses are 2+ on the right side and 2+ on the left side.  Pulmonary:     Effort: Pulmonary effort is normal.     Breath sounds: Normal breath sounds.  Abdominal:     General: Bowel sounds are normal. There is no distension.     Palpations: Abdomen is soft. There is no mass.      Tenderness: There is no abdominal tenderness.     Hernia: No hernia is present.  Musculoskeletal:       Feet:  Feet:     Right foot:     Skin integrity: Callus present.     Left foot:     Skin  integrity: Callus present.  Lymphadenopathy:     Cervical: No cervical adenopathy.  Skin:    General: Skin is warm.  Neurological:     Mental Status: He is alert.     Motor: No weakness.     Gait: Gait normal.  Psychiatric:        Mood and Affect: Mood normal.        Behavior: Behavior normal.        Thought Content: Thought content normal.        Judgment: Judgment normal.    BP 122/68   Pulse 60   Temp 97.9 F (36.6 C)   Resp 12   Ht 5' 10.25" (1.784 m)   Wt 187 lb 12 oz (85.2 kg)   BMI 26.75 kg/m  Wt Readings from Last 3 Encounters:  06/27/21 187 lb 12 oz (85.2 kg)  05/25/21 188 lb 8 oz (85.5 kg)  02/10/21 190 lb (86.2 kg)   [facility] 05/25/2021 06/15/2019 06/14/2017  Decreased Interest 0 0 0  Down, Depressed, Hopeless 1 0 0  PHQ - 2 Score 1 0 0     Health Maintenance Due  Topic Date Due   COVID-19 Vaccine (1) Never done   TETANUS/TDAP  Never done   COLONOSCOPY (Pts 45-5880yrs Insurance coverage will need to be confirmed)  Never done   Zoster Vaccines- Shingrix (1 of 2) Never done   Pneumococcal Vaccine 750-60 Years old (2 - PCV) 06/14/2020   FOOT EXAM  06/14/2020   HEMOGLOBIN A1C  06/21/2021    There are no preventive care reminders to display for this patient.  No results found for: TSH Lab Results  Component Value Date   WBC 7.0 12/22/2020   HGB 17.4 (H) 12/22/2020   HCT 49.2 12/22/2020   MCV 90.3 12/22/2020   PLT 144.0 (L) 12/22/2020   Lab Results  Component Value Date   NA 139 12/22/2020   K 3.7 12/22/2020   CO2 30 12/22/2020   GLUCOSE 143 (H) 12/22/2020   BUN 13 12/22/2020   CREATININE 0.75 12/22/2020   BILITOT 1.1 12/22/2020   ALKPHOS 117 12/22/2020   AST 18 12/22/2020   ALT 21 12/22/2020   PROT 7.5 12/22/2020   ALBUMIN 4.7  12/22/2020   CALCIUM 9.8 12/22/2020   ANIONGAP 7 05/19/2018   GFR 98.93 12/22/2020   Lab Results  Component Value Date   CHOL 90 (L) 05/11/2021   Lab Results  Component Value Date   HDL 32 (L) 05/11/2021   Lab Results  Component Value Date   LDLCALC 26 05/11/2021   Lab Results  Component Value Date   TRIG 205 (H) 05/11/2021   Lab Results  Component Value Date   CHOLHDL 2.8 05/11/2021   Lab Results  Component Value Date   HGBA1C 7.5 (H) 12/22/2020      Assessment & Plan:   Problem List Items Addressed This Visit       Cardiovascular and Mediastinum   Essential hypertension    BP within goal today. Managed by cariology. Continue Metoprolol and lisinopril      Relevant Orders   Comprehensive metabolic panel   CBC     Endocrine   Type 2 diabetes mellitus with other specified complication (HCC)    Will check A1c and Urine/creatinine ratio in office today. Pending lab work Continue metformin 1500mg  total daily  We will have patient follow-up in 6 months pending A1c.  Previous A1c's have been well controlled most recent was  above goal at 7.5 depending on today's results depends on whether he has to come back in 3 or 6 months.  Did discuss this with patient he is in agreement with plan.      Relevant Orders   Hemoglobin A1c   CBC   Microalbumin / creatinine urine ratio     Other   Hyperlipidemia LDL goal <70    Currently managed by cardiology. Pending labs in office today.  Continue following up with cardiology. Continue atorvastatin and praluent injections.       Preventative health care - Primary    Reviewed age appropriate vaccines and preventative exams with patient. Performed Diabetic monofilament exam in office      Relevant Orders   Lipid panel   Hemoglobin A1c   Comprehensive metabolic panel   CBC   PSA, Medicare   TSH   Microalbumin / creatinine urine ratio    No orders of the defined types were placed in this encounter.   Follow-up:  Return in about 6 months (around 12/26/2021).   This visit occurred during the SARS-CoV-2 public health emergency.  Safety protocols were in place, including screening questions prior to the visit, additional usage of staff PPE, and extensive cleaning of exam room while observing appropriate contact time as indicated for disinfecting solutions.   Audria Nine, NP

## 2021-06-27 NOTE — Assessment & Plan Note (Signed)
Currently managed by cardiology. Pending labs in office today.  Continue following up with cardiology. Continue atorvastatin and praluent injections.

## 2021-06-27 NOTE — Assessment & Plan Note (Addendum)
Reviewed age appropriate vaccines and preventative exams with patient. Performed Diabetic monofilament exam in office

## 2021-07-10 ENCOUNTER — Other Ambulatory Visit: Payer: Self-pay | Admitting: Internal Medicine

## 2021-07-19 ENCOUNTER — Ambulatory Visit: Payer: Medicare HMO | Admitting: Internal Medicine

## 2021-07-19 ENCOUNTER — Other Ambulatory Visit: Payer: Self-pay

## 2021-07-19 ENCOUNTER — Encounter: Payer: Self-pay | Admitting: Internal Medicine

## 2021-07-19 VITALS — BP 120/86 | HR 78 | Ht 71.0 in | Wt 190.0 lb

## 2021-07-19 DIAGNOSIS — I255 Ischemic cardiomyopathy: Secondary | ICD-10-CM

## 2021-07-19 DIAGNOSIS — I5022 Chronic systolic (congestive) heart failure: Secondary | ICD-10-CM

## 2021-07-19 DIAGNOSIS — E782 Mixed hyperlipidemia: Secondary | ICD-10-CM | POA: Diagnosis not present

## 2021-07-19 DIAGNOSIS — Z720 Tobacco use: Secondary | ICD-10-CM

## 2021-07-19 DIAGNOSIS — E1159 Type 2 diabetes mellitus with other circulatory complications: Secondary | ICD-10-CM | POA: Diagnosis not present

## 2021-07-19 DIAGNOSIS — I2510 Atherosclerotic heart disease of native coronary artery without angina pectoris: Secondary | ICD-10-CM

## 2021-07-19 DIAGNOSIS — I152 Hypertension secondary to endocrine disorders: Secondary | ICD-10-CM

## 2021-07-19 DIAGNOSIS — I10 Essential (primary) hypertension: Secondary | ICD-10-CM

## 2021-07-19 NOTE — Progress Notes (Signed)
Follow-up Outpatient Visit Date: 07/19/2021  Primary Care Provider: Eden Emms, NP [address] [place] [address]  Chief Complaint: Follow-up coronary artery disease  HPI:  John Mathis is a 59 y.o. male with history of CAD with report of 34 prior stent placements while living in [place], ischemic cardiomyopathy with LVEF of 30-35% by echo in 2017 but found to be 50-55% in 10/2017, hypertension, hyperlipidemia, diabetes mellitus, and Lyme disease, who presents for follow-up of coronary artery disease.  I last saw him in 04/2020, at which time he was feeling well other than intermittent heartburn from spicy foods.  He saw Dr. Rennis Golden for follow-up in the lipid clinic in March, at which time Vascepa was added to his regimen of atorvastatin and Praluent.  Today, John Mathis reports that he is feeling quite well.  He denies chest pain, shortness of breath, palpitations, lightheadedness, and edema.  He tries to walk a lot.  He does not check his blood pressure regularly at home.  He stopped taking Vascepa due to significant indigestion and episodes of vomiting.  He has not followed up yet with Dr. Rennis Golden but is in the process of trying to reschedule this.  --------------------------------------------------------------------------------------------------  Cardiovascular History & Procedures: Cardiovascular Problems: Coronary artery disease status post multiple PCI's Chronic systolic heart failure secondary to ischemic cardiomyopathy   Risk Factors: Known coronary artery disease, hypertension, hyperlipidemia, diabetes mellitus, male gender, and age greater than 33   Cath/PCI: None available.  Patient reports a total of 14 stents from 2012 through 2014   CV Surgery: None   EP Procedures and Devices: None   Non-Invasive Evaluation(s): TTE (11/18/17): Mildly dilated LV with normal wall thickness.  LVEF 50-55% with normal wall motion and diastolic function.  Normal RV size and  function. TTE (01/18/16, [facility], [place], [place]): Normal LV size.  LVEF 30-35% with inferior hypokinesis.  Trace aortic and mild mitral regurgitation.  Trace to mild tricuspid regurgitation.  Normal RV size and function.  Recent CV Pertinent Labs: Lab Results  Component Value Date   CHOL 77 06/27/2021   CHOL 90 (L) 05/11/2021   HDL 31.70 (L) 06/27/2021   HDL 32 (L) 05/11/2021   LDLCALC 26 05/11/2021   LDLDIRECT 22.0 06/27/2021   TRIG 213.0 (H) 06/27/2021   CHOLHDL 2 06/27/2021   INR 1.00 05/19/2018   K 4.6 06/27/2021   BUN 10 06/27/2021   BUN 13 06/14/2017   CREATININE 0.82 06/27/2021    Past medical and surgical history were reviewed and updated in EPIC.  Current Meds  Medication Sig   aspirin EC 81 MG tablet Take 81 mg by mouth daily.   atorvastatin (LIPITOR) 40 MG tablet TAKE 1 TABLET BY MOUTH EVERY DAY   clopidogrel (PLAVIX) 75 MG tablet TAKE 1 TABLET BY MOUTH EVERY DAY   lisinopril (ZESTRIL) 5 MG tablet TAKE 1 TABLET BY MOUTH EVERY DAY   metFORMIN (GLUCOPHAGE) 500 MG tablet TAKE 1 TABLET BY MOUTH 2 TIMES DAILY WITH A MEAL.   metoprolol succinate (TOPROL-XL) 25 MG 24 hr tablet TAKE 1 TABLET BY MOUTH TWICE A DAY   PRALUENT 150 MG/ML SOAJ INJECT 1 PEN INTO THE SKIN EVERY 14 (FOURTEEN) DAYS.    Allergies: Other and Ivp dye [iodinated diagnostic agents]  Social History   Tobacco Use   Smoking status: Every Day    Packs/day: 1.25    Years: 43.00    Pack years: 53.75    Types: Cigarettes  Smokeless tobacco: Never  Substance Use Topics   Alcohol use: No   Drug use: No    Family History  Problem Relation Age of Onset   Hyperlipidemia Mother    Other Father        parasite infection   Mental illness Maternal Grandmother     Review of Systems: A 12-system review of systems was performed and was negative except as noted in the  HPI.  --------------------------------------------------------------------------------------------------  Physical Exam: BP 120/86 (BP Location: Left Arm, Patient Position: Sitting, Cuff Size: Large)   Pulse 78   Ht 5\' 11"  (1.803 m)   Wt 190 lb (86.2 kg)   SpO2 98%   BMI 26.50 kg/m   General:  NAD. Neck: No JVD or HJR. Lungs: Clear to auscultation bilaterally without wheezes or crackles. Heart: Regular rate and rhythm without murmurs, rubs, or gallops. Abdomen: Soft, nontender, nondistended. Extremities: No lower extremity edema.  EKG: Normal sinus rhythm with inferior infarct.  No significant change since 05/18/2020.  Lab Results  Component Value Date   WBC 6.3 06/27/2021   HGB 17.5 (H) 06/27/2021   HCT 50.3 06/27/2021   MCV 89.5 06/27/2021   PLT 171.0 06/27/2021    Lab Results  Component Value Date   NA 135 06/27/2021   K 4.6 06/27/2021   CL 100 06/27/2021   CO2 29 06/27/2021   BUN 10 06/27/2021   CREATININE 0.82 06/27/2021   GLUCOSE 117 (H) 06/27/2021   ALT 15 06/27/2021    Lab Results  Component Value Date   CHOL 77 06/27/2021   HDL 31.70 (L) 06/27/2021   LDLCALC 26 05/11/2021   LDLDIRECT 22.0 06/27/2021   TRIG 213.0 (H) 06/27/2021   CHOLHDL 2 06/27/2021    --------------------------------------------------------------------------------------------------  ASSESSMENT AND PLAN: Coronary artery disease without angina: No significant angina reported.  Continue indefinite dual antiplatelet therapy for secondary prevention given [place] st[place].  Chronic HFrEF with recovered LVEF: John Mathis appears euvolemic with stable NYHA class I-II symptoms.  Continue current regimen of metoprolol and lisinopril.  Hyperlipidemia: LDL well controlled though triglycerides remain elevated.  Unfortunately, John Mathis was intolerant of Vascepa.  I have encouraged him to follow-up with Dr. Lindaann Slough for ongoing management of his hyperlipidemia.  Hypertension  associated with type 2 diabetes mellitus: Diastolic blood pressure mildly elevated today but typically better at home.  Continue current regimen of metoprolol and lisinopril.  Ongoing management of diabetes mellitus per PCP.  Tobacco abuse: John Mathis continues to smoke at least 1 pack/day.  We discussed the importance of quitting, though he remains in the precontemplative phase.  Follow-up: Return to clinic in 1 year.  Lindaann Slough, MD 07/19/2021 10:49 AM

## 2021-07-19 NOTE — Patient Instructions (Signed)
Medication Instructions:   Your physician recommends that you continue on your current medications as directed. Please refer to the Current Medication list given to you today.  *If you need a refill on your cardiac medications before your next appointment, please call your pharmacy*   Lab Work:  None ordered  Testing/Procedures:  None ordered   Follow-Up: At [facility], you and your health needs are our priority.  As part of our continuing mission to provide you with exceptional heart care, we have created designated Provider Care Teams.  These Care Teams include your primary Cardiologist (physician) and Advanced Practice Providers (APPs -  Physician Assistants and Nurse Practitioners) who all work together to provide you with the care you need, when you need it.  We recommend signing up for the patient portal called "MyChart".  Sign up information is provided on this After Visit Summary.  MyChart is used to connect with patients for Virtual Visits (Telemedicine).  Patients are able to view lab/test results, encounter notes, upcoming appointments, etc.  Non-urgent messages can be sent to your provider as well.   To learn more about what you can do with MyChart, go to [email or web address].    Your next appointment:   1 year(s)  The format for your next appointment:   In Person  Provider:   You may see Yvonne Kendall, MD or one of the following Advanced Practice Providers on your designated Care Team:   Nicolasa Ducking, NP Eula Listen, PA-C Marisue Ivan, PA-C Cadence Fransico Michael, [place]   Other Instructions  Steps to Quit Smoking Smoking tobacco is the leading cause of preventable death. It can affect almost every organ in the body. Smoking puts you and those around you at risk for developing many serious chronic diseases. Quitting smoking can be difficult, but it is one of the best things that you can do for your health. It is never too late to quit. How do I get ready to  quit? When you decide to quit smoking, create a plan to help you succeed. Before you quit: Pick a date to quit. Set a date within the next 2 weeks to give you time to prepare. Write down the reasons why you are quitting. Keep this list in places where you will see it often. Tell your family, friends, and co-workers that you are quitting. Support from your loved ones can make quitting easier. Talk with your health care provider about your options for quitting smoking. Find out what treatment options are covered by your health insurance. Identify people, places, things, and activities that make you want to smoke (triggers). Avoid them. What first steps can I take to quit smoking? Throw away all cigarettes at home, at work, and in your car. Throw away smoking accessories, such as Set designer. Clean your car. Make sure to empty the ashtray. Clean your home, including curtains and carpets. What strategies can I use to quit smoking? Talk with your health care provider about combining strategies, such as taking medicines while you are also receiving in-person counseling. Using these two strategies together makes you more likely to succeed in quitting than if you used either strategy on its own. If you are pregnant or breastfeeding, talk with your health care provider about finding counseling or other support strategies to quit smoking. Do not take medicine to help you quit smoking unless your health care provider tells you to do so. To quit smoking: Quit right away Quit smoking completely, instead of gradually reducing how much  you smoke over a period of time. Research shows that stopping smoking right away is more successful than gradually quitting. Attend in-person counseling to help you build problem-solving skills. You are more likely to succeed in quitting if you attend counseling sessions regularly. Even short sessions of 10 minutes can be effective. Take medicine You may take medicines  to help you quit smoking. Some medicines require a prescription and some you can purchase over-the-counter. Medicines may have nicotine in them to replace the nicotine in cigarettes. Medicines may: Help to stop cravings. Help to relieve withdrawal symptoms. Your health care provider may recommend: Nicotine patches, gum, or lozenges. Nicotine inhalers or sprays. Non-nicotine medicine that is taken by mouth. Find resources Find resources and support systems that can help you to quit smoking and remain smoke-free after you quit. These resources are most helpful when you use them often. They include: Online chats with a Veterinary surgeon. Telephone quitlines. Printed Materials engineer. Support groups or group counseling. Text messaging programs. Mobile phone apps or applications. Use apps that can help you stick to your quit plan by providing reminders, tips, and encouragement. There are many free apps for mobile devices as well as websites. Examples include Quit Guide from the [employer] and smokefree.gov What things can I do to make it easier to quit?  Reach out to your family and friends for support and encouragement. Call telephone quitlines (1-800-QUIT-NOW), reach out to support groups, or work with a counselor for support. Ask people who smoke to avoid smoking around you. Avoid places that trigger you to smoke, such as bars, parties, or smoke-break areas at work. Spend time with people who do not smoke. Lessen the stress in your life. Stress can be a smoking trigger for some people. To lessen stress, try: Exercising regularly. Doing deep-breathing exercises. Doing yoga. Meditating. Performing a body scan. This involves closing your eyes, scanning your body from head to toe, and noticing which parts of your body are particularly tense. Try to relax the muscles in those areas. How will I feel when I quit smoking? Day 1 to 3 weeks Within the first 24 hours of quitting smoking, you may start to feel  withdrawal symptoms. These symptoms are usually most noticeable 2-3 days after quitting, but they usually do not last for more than 2-3 weeks. You may experience these symptoms: Mood swings. Restlessness, anxiety, or irritability. Trouble concentrating. Dizziness. Strong cravings for sugary foods and nicotine. Mild weight gain. Constipation. Nausea. Coughing or a sore throat. Changes in how the medicines that you take for unrelated issues work in your body. Depression. Trouble sleeping (insomnia). Week 3 and afterward After the first 2-3 weeks of quitting, you may start to notice more positive results, such as: Improved sense of smell and taste. Decreased coughing and sore throat. Slower heart rate. Lower blood pressure. Clearer skin. The ability to breathe more easily. Fewer sick days. Quitting smoking can be very challenging. Do not get discouraged if you are not successful the first time. Some people need to make many attempts to quit before they achieve long-term success. Do your best to stick to your quit plan, and talk with your health care provider if you have any questions or concerns. Summary Smoking tobacco is the leading cause of preventable death. Quitting smoking is one of the best things that you can do for your health. When you decide to quit smoking, create a plan to help you succeed. Quit smoking right away, not slowly over a period of time. When  you start quitting, seek help from your health care provider, family, or friends. This information is not intended to replace advice given to you by your health care provider. Make sure you discuss any questions you have with your health care provider. Document Revised: 07/10/2019 Document Reviewed: 01/03/2019 Elsevier Patient Education  2022 [employer].

## 2021-07-21 ENCOUNTER — Encounter: Payer: Self-pay | Admitting: Internal Medicine

## 2021-08-12 ENCOUNTER — Other Ambulatory Visit: Payer: Self-pay | Admitting: Internal Medicine

## 2021-08-14 NOTE — Telephone Encounter (Signed)
Refill request   Per Dr. Serita Kyle 07/19/2021 office note: Hyperlipidemia: LDL well controlled though triglycerides remain elevated.  Unfortunately, John Mathis was intolerant of Vascepa.  I have encouraged him to follow-up with Dr. Rennis Golden for ongoing management of his hyperlipidemia.

## 2021-08-14 NOTE — Telephone Encounter (Signed)
Refill request

## 2021-09-20 ENCOUNTER — Other Ambulatory Visit: Payer: Self-pay | Admitting: Nurse Practitioner

## 2021-09-20 DIAGNOSIS — E1169 Type 2 diabetes mellitus with other specified complication: Secondary | ICD-10-CM

## 2021-09-29 NOTE — Progress Notes (Signed)
Subjective:   John Mathis is a 60 y.o. male who presents for Medicare Annual/Subsequent preventive examination.  I connected with John Mathis today by telephone and verified that I am speaking with the correct person using two identifiers. Location patient: home Location provider: work Persons participating in the virtual visit: patient, Engineer, civil (consulting).    I discussed the limitations, risks, security and privacy concerns of performing an evaluation and management service by telephone and the availability of in person appointments. I also discussed with the patient that there may be a patient responsible charge related to this service. The patient expressed understanding and verbally consented to this telephonic visit.    Interactive audio and video telecommunications were attempted between this provider and patient, however failed, due to patient having technical difficulties OR patient did not have access to video capability.  We continued and completed visit with audio only.  Some vital signs may be absent or patient reported.   Time Spent with patient on telephone encounter: 30 minutes  Review of Systems     Cardiac Risk Factors include: diabetes mellitus;advanced age (>20men, >67 women);dyslipidemia;hypertension     Objective:    Today's Vitals   10/02/21 1030  Weight: 190 lb (86.2 kg)  Height:  (1.803 m)   Body mass index is 26.5 kg/m.  Advanced Directives 10/02/2021 05/08/2019 05/19/2018  Does Patient Have a Medical Advance Directive? No No No  Would patient like information on creating a medical advance directive? Yes (MAU/Ambulatory/Procedural Areas - Information given) No - Patient declined No - Patient declined    Current Medications (verified) Outpatient Encounter Medications as of 10/02/2021  Medication Sig   aspirin EC 81 MG tablet Take 81 mg by mouth daily.   atorvastatin (LIPITOR) 40 MG tablet TAKE 1 TABLET BY MOUTH EVERY DAY   clopidogrel (PLAVIX) 75 MG  tablet TAKE 1 TABLET BY MOUTH EVERY DAY   lisinopril (ZESTRIL) 5 MG tablet TAKE 1 TABLET BY MOUTH EVERY DAY   metFORMIN (GLUCOPHAGE) 500 MG tablet TAKE 1 TABLET BY MOUTH 2 TIMES DAILY WITH A MEAL.   metoprolol succinate (TOPROL-XL) 25 MG 24 hr tablet TAKE 1 TABLET BY MOUTH TWICE A DAY   PRALUENT 150 MG/ML SOAJ INJECT 1 PEN INTO THE SKIN EVERY 14 (FOURTEEN) DAYS.   No facility-administered encounter medications on file as of 10/02/2021.    Allergies (verified) Other and Ivp dye [iodinated diagnostic agents]   History: Past Medical History:  Diagnosis Date   Arthritis    CAD (coronary artery disease)    a. S/p multiple PCIs (report of 14 prior stents in [place]).   CHF (congestive heart failure) (HCC)    Depression    Diabetes mellitus without complication (HCC)    Diverticulitis    Familial hyperlipidemia    a. 02/2019 seen in lipid clinic-->Praluent started.   Hypertension    Ischemic cardiomyopathy    a. 01/18/2016 Echo [facility] [place] Nl LV size. EF 30-35% w/ inf HK. Trace AI, mild MR. Trace to mild TR. Nl RV size/fxn; b. 11/18/2017 Echo: Mildly dil LV w/ nl wall thickness. LVEF 50-55% w/ no rwma. Nl RV size/fxn.   Lyme disease    Myocardial infarction (HCC)    X2   Past Surgical History:  Procedure Laterality Date   CARDIAC CATHETERIZATION  2012   x4 stents   CARDIAC CATHETERIZATION  2014   x6 stents   CARDIAC CATHETERIZATION  2014   x2 stents   HERNIA REPAIR  JOINT REPLACEMENT     partial then full   KNEE SURGERY Right    SHOULDER ARTHROSCOPY WITH OPEN ROTATOR CUFF REPAIR AND DISTAL CLAVICLE ACROMINECTOMY Left 05/22/2018   Procedure: SHOULDER ARTHROSCOPY WITH OPEN ROTATOR CUFF REPAIR, DISTAL CLAVICLE EXCISION, AND SUBACROMINAL DECOMPRESSION;  Surgeon: Juanell Fairly, MD;  Location: [facility] ORS;  Service: Orthopedics;  Laterality: Left;   SMALL INTESTINE SURGERY     TONSILLECTOMY     Family History  Problem Relation Age of Onset    Hyperlipidemia Mother    Other Father        parasite infection   Mental illness Maternal Grandmother    Social History   Socioeconomic History   Marital status: Widowed    Spouse name: Not on file   Number of children: Not on file   Years of education: Not on file   Highest education level: Not on file  Occupational History   Not on file  Tobacco Use   Smoking status: Every Day    Packs/day: 1.25    Years: 43.00    Pack years: 53.75    Types: Cigarettes   Smokeless tobacco: Never  Vaping Use   Vaping Use: Not on file  Substance and Sexual Activity   Alcohol use: No   Drug use: No   Sexual activity: Not on file  Other Topics Concern   Not on file  Social History Narrative   Not on file   Social Determinants of Health   Financial Resource Strain: Low Risk    Difficulty of Paying Living Expenses: Not hard at all  Food Insecurity: No Food Insecurity   Worried About Programme researcher, broadcasting/film/video in the Last Year: Never true   Ran Out of Food in the Last Year: Never true  Transportation Needs: No Transportation Needs   Lack of Transportation (Medical): No   Lack of Transportation (Non-Medical): No  Physical Activity: Sufficiently Active   Days of Exercise per Week: 7 days   Minutes of Exercise per Session: 30 min  Stress: No Stress Concern Present   Feeling of Stress : Not at all  Social Connections: Socially Isolated   Frequency of Communication with Friends and Family: Never   Frequency of Social Gatherings with Friends and Family: More than three times a week   Attends Religious Services: Never   Database administrator or Organizations: No   Attends Engineer, structural: Never   Marital Status: Divorced    Tobacco Counseling Ready to quit: Not Answered Counseling given: Not Answered   Clinical Intake:  Pre-visit preparation completed: Yes  Pain : No/denies pain     BMI - recorded: 26.51 Nutritional Status: BMI 25 -29 Overweight Nutritional Risks:  None Diabetes: Yes CBG done?: No Did pt. bring in CBG monitor from home?: No  How often do you need to have someone help you when you read instructions, pamphlets, or other written materials from your doctor or pharmacy?: 1 - Never  Diabetes:  Is the patient diabetic?  Yes  If diabetic, was a CBG obtained today?  No , visit completed over the phone. Did the patient bring in their glucometer from home?  No , visit completed over the phone. How often do you monitor your CBG's? none.   Financial Strains and Diabetes Management:  Are you having any financial strains with the device, your supplies or your medication? No .  Does the patient want to be seen by Chronic Care Management for management of their diabetes?  No  Would the patient like to be referred to a Nutritionist or for Diabetic Management?  No   Diabetic Exams:  Diabetic Eye Exam: Completed 05/23/21.    Diabetic Foot Exam: Completed 06/27/21.   Interpreter Needed?: No  Information entered by :: Sharen Heck LPN   Activities of Daily Living In your present state of health, do you have any difficulty performing the following activities: 10/02/2021  Hearing? N  Vision? N  Difficulty concentrating or making decisions? N  Walking or climbing stairs? Y  Comment occasionally  Dressing or bathing? N  Doing errands, shopping? N  Preparing Food and eating ? N  Using the Toilet? N  In the past six months, have you accidently leaked urine? N  Do you have problems with loss of bowel control? N  Managing your Medications? N  Managing your Finances? N  Housekeeping or managing your Housekeeping? N  Some recent data might be hidden    Patient Care Team: Eden Emms, NP as PCP - General (Pain Medicine) End, Cristal Deer, MD as PCP - Cardiology (Cardiology)  Indicate any recent Medical Services you may have received from other than [facility] providers in the past year (date may be approximate).     Assessment:   This is a  routine wellness examination for [facility].  Hearing/Vision screen Hearing Screening - Comments:: No issues Vision Screening - Comments:: Last exam 05/23/21, [facility]  Dietary issues and exercise activities discussed: Current Exercise Habits: Home exercise routine, Type of exercise: walking, Time (Minutes): 30, Frequency (Times/Week): 7, Weekly Exercise (Minutes/Week): 210, Intensity: Moderate   Goals Addressed             This Visit's Progress    Patient Stated       Would like to maintain current routine       Depression Screen PHQ 2/9 Scores 10/02/2021 05/25/2021 06/15/2019 06/14/2017  PHQ - 2 Score 0 1 0 0    Fall Risk Fall Risk  10/02/2021 06/14/2017  Falls in the past year? 0 No  Number falls in past yr: 0 -  Injury with Fall? 0 -  Risk for fall due to : No Fall Risks -  Follow up Falls prevention discussed -    FALL RISK PREVENTION PERTAINING TO THE HOME:  Any stairs in or around the home? Yes  If so, are there any without handrails? Yes  Home free of loose throw rugs in walkways, pet beds, electrical cords, etc? Yes  Adequate lighting in your home to reduce risk of falls? Yes   ASSISTIVE DEVICES UTILIZED TO PREVENT FALLS:  Life alert? No  Use of a cane, walker or w/c? No  Grab bars in the bathroom? Yes  Shower chair or bench in shower? Yes  Elevated toilet seat or a handicapped toilet? No   TIMED UP AND GO:  Was the test performed? No , visit completed over the phone.    Cognitive Function:    Normal cognitive status assessed by this Nurse Health Advisor. No abnormalities found.      Immunizations Immunization History  Administered Date(s) Administered   PFIZER(Purple Top)SARS-COV-2 Vaccination 01/22/2020, 02/12/2020   Pneumococcal Polysaccharide-23 06/15/2019    TDAP status: Due, Education has been provided regarding the importance of this vaccine. Advised may receive this vaccine at local pharmacy or Health Dept. Aware to provide  a copy of the vaccination record if obtained from local pharmacy or Health Dept. Verbalized acceptance and understanding.  Flu Vaccine status: Declined,  Education has been provided regarding the importance of this vaccine but patient still declined. Advised may receive this vaccine at local pharmacy or Health Dept. Aware to provide a copy of the vaccination record if obtained from local pharmacy or Health Dept. Verbalized acceptance and understanding.  Pneumococcal vaccine status: Up to date  Covid-19 vaccine status: Declined, Education has been provided regarding the importance of this vaccine but patient still declined. Advised may receive this vaccine at local pharmacy or Health Dept.or vaccine clinic. Aware to provide a copy of the vaccination record if obtained from local pharmacy or Health Dept. Verbalized acceptance and understanding.  Qualifies for Shingles Vaccine? Yes   Zostavax completed No   Shingrix Completed?: No.    Education has been provided regarding the importance of this vaccine. Patient has been advised to call insurance company to determine out of pocket expense if they have not yet received this vaccine. Advised may also receive vaccine at local pharmacy or Health Dept. Verbalized acceptance and understanding.  Screening Tests Health Maintenance  Topic Date Due   TETANUS/TDAP  Never done   COLONOSCOPY (Pts 45-42yrs Insurance coverage will need to be confirmed)  Never done   COVID-19 Vaccine (3 - Booster for Pfizer series) 04/08/2020   Pneumococcal Vaccine 54-62 Years old (2 - PCV) 06/14/2020   INFLUENZA VACCINE  01/26/2022 (Originally 05/29/2021)   Zoster Vaccines- Shingrix (1 of 2) 04/02/2022 (Originally 09/09/2011)   HEMOGLOBIN A1C  12/26/2021   OPHTHALMOLOGY EXAM  05/23/2022   FOOT EXAM  06/27/2022   Hepatitis C Screening  Completed   HIV Screening  Completed   HPV VACCINES  Aged Out    Health Maintenance  Health Maintenance Due  Topic Date Due   TETANUS/TDAP   Never done   COLONOSCOPY (Pts 45-62yrs Insurance coverage will need to be confirmed)  Never done   COVID-19 Vaccine (3 - Booster for Pfizer series) 04/08/2020   Pneumococcal Vaccine 46-12 Years old (2 - PCV) 06/14/2020    Colorectal cancer screening: Declined today, advised patient to contact PCP when decided  Lung Cancer Screening: (Low Dose CT Chest recommended if Age 53-80 years, 30 pack-year currently smoking OR have quit w/in 15years.) does qualify. , Lung CT scan completed 02/10/21    Additional Screening:  Hepatitis C Screening: does qualify; Completed 06/15/19  Vision Screening: Recommended annual ophthalmology exams for early detection of glaucoma and other disorders of the eye. Is the patient up to date with their annual eye exam?  Yes  Who is the provider or what is the name of the office in which the patient attends annual eye exams? [facility]  Dental Screening: Recommended annual dental exams for proper oral hygiene  Community Resource Referral / Chronic Care Management: CRR required this visit?  No   CCM required this visit?  No      Plan:     I have personally reviewed and noted the following in the patient's chart:   Medical and social history Use of alcohol, tobacco or illicit drugs  Current medications and supplements including opioid prescriptions. Patient is not currently taking opioid prescriptions. Functional ability and status Nutritional status Physical activity Advanced directives List of other physicians Hospitalizations, surgeries, and ER visits in previous 12 months Vitals Screenings to include cognitive, depression, and falls Referrals and appointments  In addition, I have reviewed and discussed with patient certain preventive protocols, quality metrics, and best practice recommendations. A written personalized care plan for preventive services as well as general preventive  health recommendations were provided to patient.   Due  to this being a telephonic visit, the after visit summary with patients personalized plan was offered to patient via mail or my-chart.  Patient would like to access on my-chart.  Janne Lab, LPN   25/03/3892   Nurse Health Advisor  Nurse Notes: none

## 2021-10-02 ENCOUNTER — Ambulatory Visit (INDEPENDENT_AMBULATORY_CARE_PROVIDER_SITE_OTHER): Payer: Medicare HMO

## 2021-10-02 VITALS — Ht 71.0 in | Wt 190.0 lb

## 2021-10-02 DIAGNOSIS — Z0000 Encounter for general adult medical examination without abnormal findings: Secondary | ICD-10-CM

## 2021-10-02 NOTE — Patient Instructions (Signed)
John Mathis , Thank you for taking time to complete your Medicare Wellness Visit. I appreciate your ongoing commitment to your health goals. Please review the following plan we discussed and let me know if I can assist you in the future.   Screening recommendations/referrals: Colonoscopy: Declined today, please call office to schedule if you change your mind Recommended yearly ophthalmology/optometry visit for glaucoma screening and checkup Recommended yearly dental visit for hygiene and checkup  Vaccinations: Influenza vaccine: Due-May obtain vaccine at our office or your local pharmacy. Pneumococcal vaccine: up to date Tdap vaccine: Declined today, please call your local pharmacy to schedule if you change your mind Shingles vaccine: Declined today, please call your local pharmacy to schedule if you change your mind Covid-19:  newest booster available at your local pharmacy  Advanced directives: information available at your next appointment  Conditions/risks identified: see problem list  Next appointment: Follow up in one year for your annual wellness visit. 10/05/22 @ 9:00am, this will be a telephone visit  Preventive Care 60 Years and Older, Male Preventive care refers to lifestyle choices and visits with your health care provider that can promote health and wellness. What does preventive care include? A yearly physical exam. This is also called an annual well check. Dental exams once or twice a year. Routine eye exams. Ask your health care provider how often you should have your eyes checked. Personal lifestyle choices, including: Daily care of your teeth and gums. Regular physical activity. Eating a healthy diet. Avoiding tobacco and drug use. Limiting alcohol use. Practicing safe sex. Taking low doses of aspirin every day. Taking vitamin and mineral supplements as recommended by your health care provider. What happens during an annual well check? The services and screenings  done by your health care provider during your annual well check will depend on your age, overall health, lifestyle risk factors, and family history of disease. Counseling  Your health care provider may ask you questions about your: Alcohol use. Tobacco use. Drug use. Emotional well-being. Home and relationship well-being. Sexual activity. Eating habits. History of falls. Memory and ability to understand (cognition). Work and work Astronomer. Screening  You may have the following tests or measurements: Height, weight, and BMI. Blood pressure. Lipid and cholesterol levels. These may be checked every 5 years, or more frequently if you are over 60 years old. Skin check. Lung cancer screening. You may have this screening every year starting at age 60 if you have a 30-pack-year history of smoking and currently smoke or have quit within the past 15 years. Fecal occult blood test (FOBT) of the stool. You may have this test every year starting at age 60. Flexible sigmoidoscopy or colonoscopy. You may have a sigmoidoscopy every 5 years or a colonoscopy every 10 years starting at age 60. Prostate cancer screening. Recommendations will vary depending on your family history and other risks. Hepatitis C blood test. Hepatitis B blood test. Sexually transmitted disease (STD) testing. Diabetes screening. This is done by checking your blood sugar (glucose) after you have not eaten for a while (fasting). You may have this done every 1-3 years. Abdominal aortic aneurysm (AAA) screening. You may need this if you are a current or former smoker. Osteoporosis. You may be screened starting at age 60 if you are at high risk. Talk with your health care provider about your test results, treatment options, and if necessary, the need for more tests. Vaccines  Your health care provider may recommend certain vaccines, such as: Influenza  vaccine. This is recommended every year. Tetanus, diphtheria, and acellular  pertussis (Tdap, Td) vaccine. You may need a Td booster every 10 years. Zoster vaccine. You may need this after age 60. Pneumococcal 13-valent conjugate (PCV13) vaccine. One dose is recommended after age 60. Pneumococcal polysaccharide (PPSV23) vaccine. One dose is recommended after age 60. Talk to your health care provider about which screenings and vaccines you need and how often you need them. This information is not intended to replace advice given to you by your health care provider. Make sure you discuss any questions you have with your health care provider. Document Released: 11/11/2015 Document Revised: 07/04/2016 Document Reviewed: 08/16/2015 Elsevier Interactive Patient Education  2017 [place] Prevention in the Home Falls can cause injuries. They can happen to people of all ages. There are many things you can do to make your home safe and to help prevent falls. What can I do on the outside of my home? Regularly fix the edges of walkways and driveways and fix any cracks. Remove anything that might make you trip as you walk through a door, such as a raised step or threshold. Trim any bushes or trees on the path to your home. Use bright outdoor lighting. Clear any walking paths of anything that might make someone trip, such as rocks or tools. Regularly check to see if handrails are loose or broken. Make sure that both sides of any steps have handrails. Any raised decks and porches should have guardrails on the edges. Have any leaves, snow, or ice cleared regularly. Use sand or salt on walking paths during winter. Clean up any spills in your garage right away. This includes oil or grease spills. What can I do in the bathroom? Use night lights. Install grab bars by the toilet and in the tub and shower. Do not use towel bars as grab bars. Use non-skid mats or decals in the tub or shower. If you need to sit down in the shower, use a plastic, non-slip stool. Keep the floor  dry. Clean up any water that spills on the floor as soon as it happens. Remove soap buildup in the tub or shower regularly. Attach bath mats securely with double-sided non-slip rug tape. Do not have throw rugs and other things on the floor that can make you trip. What can I do in the bedroom? Use night lights. Make sure that you have a light by your bed that is easy to reach. Do not use any sheets or blankets that are too big for your bed. They should not hang down onto the floor. Have a firm chair that has side arms. You can use this for support while you get dressed. Do not have throw rugs and other things on the floor that can make you trip. What can I do in the kitchen? Clean up any spills right away. Avoid walking on wet floors. Keep items that you use a lot in easy-to-reach places. If you need to reach something above you, use a strong step stool that has a grab bar. Keep electrical cords out of the way. Do not use floor polish or wax that makes floors slippery. If you must use wax, use non-skid floor wax. Do not have throw rugs and other things on the floor that can make you trip. What can I do with my stairs? Do not leave any items on the stairs. Make sure that there are handrails on both sides of the stairs and use them. Fix  handrails that are broken or loose. Make sure that handrails are as long as the stairways. Check any carpeting to make sure that it is firmly attached to the stairs. Fix any carpet that is loose or worn. Avoid having throw rugs at the top or bottom of the stairs. If you do have throw rugs, attach them to the floor with carpet tape. Make sure that you have a light switch at the top of the stairs and the bottom of the stairs. If you do not have them, ask someone to add them for you. What else can I do to help prevent falls? Wear shoes that: Do not have high heels. Have rubber bottoms. Are comfortable and fit you well. Are closed at the toe. Do not wear  sandals. If you use a stepladder: Make sure that it is fully opened. Do not climb a closed stepladder. Make sure that both sides of the stepladder are locked into place. Ask someone to hold it for you, if possible. Clearly mark and make sure that you can see: Any grab bars or handrails. First and last steps. Where the edge of each step is. Use tools that help you move around (mobility aids) if they are needed. These include: Canes. Walkers. Scooters. Crutches. Turn on the lights when you go into a dark area. Replace any light bulbs as soon as they burn out. Set up your furniture so you have a clear path. Avoid moving your furniture around. If any of your floors are uneven, fix them. If there are any pets around you, be aware of where they are. Review your medicines with your doctor. Some medicines can make you feel dizzy. This can increase your chance of falling. Ask your doctor what other things that you can do to help prevent falls. This information is not intended to replace advice given to you by your health care provider. Make sure you discuss any questions you have with your health care provider. Document Released: 08/11/2009 Document Revised: 03/22/2016 Document Reviewed: 11/19/2014 Elsevier Interactive Patient Education  2017 [employer].

## 2021-10-05 ENCOUNTER — Other Ambulatory Visit: Payer: Medicare HMO

## 2021-10-09 ENCOUNTER — Other Ambulatory Visit (INDEPENDENT_AMBULATORY_CARE_PROVIDER_SITE_OTHER): Payer: Medicare HMO

## 2021-10-09 ENCOUNTER — Other Ambulatory Visit: Payer: Self-pay

## 2021-10-09 DIAGNOSIS — E1169 Type 2 diabetes mellitus with other specified complication: Secondary | ICD-10-CM | POA: Diagnosis not present

## 2021-10-10 LAB — HEMOGLOBIN A1C: Hgb A1c MFr Bld: 7.1 % — ABNORMAL HIGH (ref 4.6–6.5)

## 2021-10-26 ENCOUNTER — Other Ambulatory Visit: Payer: Self-pay | Admitting: *Deleted

## 2021-10-26 DIAGNOSIS — E785 Hyperlipidemia, unspecified: Secondary | ICD-10-CM

## 2021-11-09 ENCOUNTER — Other Ambulatory Visit: Payer: Self-pay | Admitting: Internal Medicine

## 2021-12-05 LAB — LIPID PANEL
Chol/HDL Ratio: 2.1 ratio (ref 0.0–5.0)
Cholesterol, Total: 75 mg/dL — ABNORMAL LOW (ref 100–199)
HDL: 35 mg/dL — ABNORMAL LOW (ref >39)
LDL Chol Calc (NIH): 9 mg/dL (ref 0–99)
Triglycerides: 196 mg/dL — ABNORMAL HIGH (ref 0–149)
VLDL Cholesterol Cal: 31 mg/dL (ref 5–40)

## 2021-12-07 ENCOUNTER — Ambulatory Visit: Payer: Medicare HMO | Admitting: Internal Medicine

## 2021-12-07 ENCOUNTER — Encounter: Payer: Self-pay | Admitting: Internal Medicine

## 2021-12-07 ENCOUNTER — Other Ambulatory Visit: Payer: Self-pay

## 2021-12-07 VITALS — BP 122/65 | HR 95 | Ht 71.0 in | Wt 190.6 lb

## 2021-12-07 DIAGNOSIS — I2510 Atherosclerotic heart disease of native coronary artery without angina pectoris: Secondary | ICD-10-CM | POA: Diagnosis not present

## 2021-12-07 DIAGNOSIS — E7849 Other hyperlipidemia: Secondary | ICD-10-CM

## 2021-12-07 DIAGNOSIS — E785 Hyperlipidemia, unspecified: Secondary | ICD-10-CM | POA: Diagnosis not present

## 2021-12-07 DIAGNOSIS — E781 Pure hyperglyceridemia: Secondary | ICD-10-CM | POA: Diagnosis not present

## 2021-12-07 NOTE — Patient Instructions (Signed)
Medication Instructions:  NO CHANGES   *If you need a refill on your cardiac medications before your next appointment, please call your pharmacy*   Lab Work: FASTING lab work to check cholesterol about 1 week before your next visit with Dr. Rennis Golden (in 1 year)  If you have labs (blood work) drawn today and your tests are completely normal, you will receive your results only by: MyChart Message (if you have MyChart) OR A paper copy in the mail If you have any lab test that is abnormal or we need to change your treatment, we will call you to review the results.  Follow-Up: At [facility], you and your health needs are our priority.  As part of our continuing mission to provide you with exceptional heart care, we have created designated Provider Care Teams.  These Care Teams include your primary Cardiologist (physician) and Advanced Practice Providers (APPs -  Physician Assistants and Nurse Practitioners) who all work together to provide you with the care you need, when you need it.  We recommend signing up for the patient portal called "MyChart".  Sign up information is provided on this After Visit Summary.  MyChart is used to connect with patients for Virtual Visits (Telemedicine).  Patients are able to view lab/test results, encounter notes, upcoming appointments, etc.  Non-urgent messages can be sent to your provider as well.   To learn more about what you can do with MyChart, go to [email or web address].    Your next appointment:   1 year with Dr. Rennis Golden -- lipid clinic

## 2021-12-07 NOTE — Progress Notes (Signed)
OFFICE NOTE  Chief Complaint:  Follow-up dyslipidemia  Primary Care Physician: Michela Pitcher, NP  HPI:  John Mathis is a 61 y.o. male with a past medial history significant for a history of coronary artery disease and reportedly 14 prior stent placements when living in [place].  He has an ischemic cardiomyopathy with LVEF 30 to 35% by echo in 2017 however it improved to 50 to 55% in 2019.  Other risk factors include hypertension, dyslipidemia, type 2 diabetes and a history of Lyme disease which was treated.  Not interested in quitting at this time.  Although he has discussed this before with Dr. Saunders Revel and was prescribed Chantix.  There is a strong family history of coronary disease, including coronary disease in his mother with marked dyslipidemia and her father as several of his mother's siblings and some of his siblings that have marked dyslipidemia.  He has 3 children, the 2 older children apparently have been tested for dyslipidemia and had no significant elevations.  Despite being on high intensity statin 80 mg daily, his most recent direct LDL was 179.,  His triglycerides were elevated 222 and his HDL is low at 30.  This metabolic profile is significant for someone perhaps with diabetic disease like he has or could suggest a combination of metabolic syndrome and familial hyperlipidemia or perhaps familial combined hyperlipidemia.  His untreated LDL would likely be much greater than 250 off of his statin.  Currently denies any chest pain or worsening shortness of breath.   07/07/2019   John Mathis is seen today in follow-up.  He reports some discomfort in injection site redness which persists for about a day after injection and does seem to improve.  He seems to be tolerating the Praluent otherwise and has had marked reduction in cholesterol.  His last lab work 3 weeks ago showed total cholesterol now 54 (reduced from 147), triglycerides 110, HDL 30 and LDL cholesterol of 2.  Based on his  excellent response to his PCSK9 inhibitor, I feel we could likely decrease his statin dose to 40 mg of atorvastatin daily.  12/28/2019  John Mathis is seen today in follow-up.  He continues to do well on Praluent.  As his last LDL was 2, I advised him to decrease his atorvastatin from 80 to 40 mg daily.  He has not had repeat lipids and we will plan to get those today.  He was feeling some wooziness which she attributed to the high-dose statin however reducing the dose has improved those symptoms.  01/10/2021  John Mathis returns today for follow-up.  Overall he says he is feeling well.  His lipids have come down even further.  Total cholesterol now 75, triglycerides 223, HDL 35 and LDL 19.  This is on Praluent and high potency atorvastatin 40 mg daily.  His triglycerides have been quite refractory to treatment.  More recently his A1c is up a little higher at 7.5.  His PCP was trying to adjust his Metformin.  With his persistently elevated triglycerides, I think he has would be a good candidate for Vascepa especially given his prior coronary artery disease based on the data from the 2019 REDUCE-IT trial.  12/05/2021  John Mathis is seen today in follow-up.  I previously placed him on Vascepa in addition to the [place] but he could not take it due to some reflux symptoms.  He is also on atorvastatin.  His cholesterol though is driven lower.  He has been eating more fish  and try to work on his diet.  He reports less stress in his life.  Lipids now show total cholesterol 75, triglycerides 196, HDL 35 and LDL of 9.  PMHx:  Past Medical History:  Diagnosis Date   Arthritis    CAD (coronary artery disease)    a. S/p multiple PCIs (report of 14 prior stents in [place]).   CHF (congestive heart failure) ([place])    Depression    Diabetes mellitus without complication ([place])    Diverticulitis    Familial hyperlipidemia    a. 02/2019 seen in lipid clinic-->Praluent started.   Hypertension    Ischemic cardiomyopathy     a. 01/18/2016 Echo [facility] [place]): Nl LV size. EF 30-35% w/ inf HK. Trace AI, mild MR. Trace to mild TR. Nl RV size/fxn; b. 11/18/2017 Echo: Mildly dil LV w/ nl wall thickness. LVEF 50-55% w/ no rwma. Nl RV size/fxn.   Lyme disease    Myocardial infarction ([place])    X2    Past Surgical History:  Procedure Laterality Date   CARDIAC CATHETERIZATION  2012   x4 stents   CARDIAC CATHETERIZATION  2014   x6 stents   CARDIAC CATHETERIZATION  2014   x2 stents   HERNIA REPAIR     JOINT REPLACEMENT     partial then full   KNEE SURGERY Right    SHOULDER ARTHROSCOPY WITH OPEN ROTATOR CUFF REPAIR AND DISTAL CLAVICLE ACROMINECTOMY Left 05/22/2018   Procedure: SHOULDER ARTHROSCOPY WITH OPEN ROTATOR CUFF REPAIR, DISTAL CLAVICLE EXCISION, AND SUBACROMINAL DECOMPRESSION;  Surgeon: Thornton Park, MD;  Location: [facility] ORS;  Service: Orthopedics;  Laterality: Left;   SMALL INTESTINE SURGERY     TONSILLECTOMY      FAMHx:  Family History  Problem Relation Age of Onset   Hyperlipidemia Mother    Other Father        parasite infection   Mental illness Maternal Grandmother     SOCHx:   reports that he has been smoking cigarettes. He has a 53.75 pack-year smoking history. He has never used smokeless tobacco. He reports that he does not drink alcohol and does not use drugs.  ALLERGIES:  Allergies  Allergen Reactions   Other Nausea And Vomiting    VASCEPA 1 g   Ivp Dye [Iodinated Contrast Media] Hives    ROS: Pertinent items noted in HPI and remainder of comprehensive ROS otherwise negative.  HOME MEDS: Current Outpatient Medications on File Prior to Visit  Medication Sig Dispense Refill   aspirin EC 81 MG tablet Take 81 mg by mouth daily.     atorvastatin (LIPITOR) 40 MG tablet TAKE 1 TABLET BY MOUTH EVERY DAY 90 tablet 3   clopidogrel (PLAVIX) 75 MG tablet TAKE 1 TABLET BY MOUTH EVERY DAY 90 tablet 3   lisinopril (ZESTRIL) 5 MG tablet TAKE 1 TABLET BY MOUTH EVERY  DAY 90 tablet 2   metFORMIN (GLUCOPHAGE) 500 MG tablet TAKE 1 TABLET BY MOUTH 2 TIMES DAILY WITH A MEAL. 180 tablet 1   metoprolol succinate (TOPROL-XL) 25 MG 24 hr tablet TAKE 1 TABLET BY MOUTH TWICE A DAY 180 tablet 1   PRALUENT 150 MG/ML SOAJ INJECT 1 PEN INTO THE SKIN EVERY 14 (FOURTEEN) DAYS. 2 mL 11   No current facility-administered medications on file prior to visit.    LABS/IMAGING: No results found for this or any previous visit (from the past 48 hour(s)). No results found.  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   John Mathis has had marked improvement in his lipids with LDL now down to 9 and triglycerides accordingly improved some as well.  He could not tolerate the Vascepa but has tried to supplement fish in his diet and made other changes.  I think he is actually doing very well.  He denies any anginal symptoms.  He has small increase in hemoglobin A1c from 6.9-7.1 which he is aware of.  Otherwise no changes today.  Plan follow-up annually or sooner as necessary  Pixie Casino, MD, [facility], [place] Director of the Advanced Lipid Disorders &   Cardiovascular Risk Reduction Clinic Diplomate of the American Board of Clinical Lipidology Attending Cardiologist  Direct Dial: [phone]   Fax: [phone]  Website:  [email or web address] 12/07/2021, 9:43 AM

## 2021-12-15 NOTE — Telephone Encounter (Signed)
See previous note on 3/18 °

## 2022-03-02 ENCOUNTER — Telehealth: Payer: Self-pay

## 2022-03-02 NOTE — Telephone Encounter (Signed)
Attempted to reach pt to schedule LDCT.  LVMM. °

## 2022-03-07 ENCOUNTER — Encounter: Payer: Self-pay | Admitting: *Deleted

## 2022-03-07 DIAGNOSIS — Z006 Encounter for examination for normal comparison and control in clinical research program: Secondary | ICD-10-CM

## 2022-03-07 NOTE — Research (Signed)
Message left for John Mathis with parking code, to be NPO, and reminder of appointment tomorrow. ?

## 2022-03-08 ENCOUNTER — Encounter: Payer: Medicare HMO | Admitting: *Deleted

## 2022-03-08 VITALS — BP 160/88 | HR 70 | Temp 97.3°F | Resp 18 | Ht 71.0 in | Wt 187.8 lb

## 2022-03-08 DIAGNOSIS — Z006 Encounter for examination for normal comparison and control in clinical research program: Secondary | ICD-10-CM

## 2022-03-08 NOTE — Research (Addendum)
[name] Consent  Screening run in visit   Subject Name: John Mathis  Subject met inclusion and exclusion criteria.  The informed consent form, study requirements and expectations were reviewed with the subject and questions and concerns were addressed prior to the signing of the consent form.  The subject verbalized understanding of the trial requirements.  The subject agreed to participate in the Core  trial and signed the informed consent at 0924 on 08-Mar-2022.  The informed consent was obtained prior to performance of any protocol-specific procedures for the subject.  A copy of the signed informed consent was given to the subject and a copy was placed in the subject's medical record.   John Mathis  Protocol number 1 Consent version  2 [name]  [id number]    Site [id number]  SUBJECT ID:    [id number]                       DATE: 08-Mar-2022         [x]  MALE                            []  MALE AGE: ETHINICITY:   [x]  HISPANIC/LATINO      []  NON- HISPANIC/LATINO RACE:           []   WHITE             []  BLACK/AFRICAN AMERICAN                         []   ASIAN              []  AMERICAN INDIAN/[place] NATIVE                         []   NATIVE [place]/OTHER PACIFIC ISLANDER                         [x]   OTHER  FUTURE RESEARCH [x]  USE OF SAMPLES FOR FUTURE RESEARCH [x]  Consented for Sub study CTA INCLUSION CRITERIA Consent      [x]    Pregnancy authorization []   AGE 53 or greater [x]   Triglycerides fasting 150 or greater with either: [x]   Dx of ASCVD (CAD, CVA, PAD) OR [x]   Increased risk for ASCVD as below [x]   Type 2 DM OR 2 or more below [x]   Men 47 or greater Woman 22 or greater [x]  []    Woman with Hx of preeclampsia or premature menopause (before 73) []    Family Hx of premature ASCDD (Before 43 for males, or before 66 for females  []    Current Tobacco use  [x]    Metabolic syndrome []    Hypertension with Treatment  [x]    CKD stage 3 Or (GFR 30-59)  []    LDL-C 160 or  greater LDL-C 100 or greater on therapy to lower  []    Elevated high-sensitivity C Reactive protein (>2.0)  []   Elevated lipoprotein (a) (>29m/dL or 124nmol/L) OR  []   Triglycerides fasting 500 or greater  []   Lipid-lowering med (for at least 4 weeks) [place] to comply with diet and lifestyle recommendations  [x]   Females must be non-pregnant and non-lactating and EITHER  []   Surgically sterile, post-menopausal, abstinent OR Use highly effective contraceptive at time of consent until at least 30 weeks after last dose of study drug  []   Males must be  surgical sterile, abstinent or using a highly effective contraceptive at time of consent until at least 30 weeks after the last dose of study drug   [x]     EXCLUSION CRITERIA           N/A                                            [x]  Major surgery, peripheral revascularization, or non-urgent PCI within 3 months prior to screening, or planned major surgery or major procedure during the study []   Active pancreatitis within 4 weeks prior to screening []   Acute coronary syndrome or CVA/TIA within 3 months of screening []   Screening labs: ALT or AST >3.0 x ULN Total bilirubin >1.5 ULN unless due to Gilbert's syndrome GFR >30 Urine Protein/creatine ratio >500 Uncontrolled HTN (BP>180/100 despite Treatment Uncontrolled hypothyroidism TSH>1.5 and T4 < LLN, or Hormone therapy not stable for 4 weeks or greater  []  []  []  []  []  []   DM newly dx within 12 weeks of screening A1c > 9.5 at screening  []  []   Change in basal insulin >20% within 3 months prior to screening  []   Type 1 Dm: episode of DKA or > 3 episodes of severe hypo glycerides with on 6 months prior to screening   Active infections, HIV, Hep C, Hep B []   Active infection requiring systemic antiviral or antimicrobial tx that will not be complete prior to study day 1 or active Covid 19 infection not resolved by study day 1 []   Malignancy within 5 years (except for non-melanoma  skin ca, cervical in situ ca, breast ductal ca in situ or stage 1 prostate Ca that has been tx []   Hypersensitivity to the active substance (olezarsen or placebo) []   Tx with another investigational drug or devise within 1 month or screening  []   Previous tx with an oligonucleotide within 4 months of screening []   Con meds/ procedure restrictions:   Systemic corticosteroids of anabolic steroids within 6 weeks prior to screening and during the study unless approved   []   Use of bile acids resins (colestipol or Colesevelam) within 4 weeks prior to screening or planned during the study  []   Plasma apheresis within 4 weeks prior to screening or planned during the study  []   Change in meds known to exacerbate hypertriglyceridemia (beta blockers, thiazides, isotretinoin, oral antidiabetic meds, tamoxifen, estrogens or progestins within 4 weeks prior to screening  []    Change or expected need for significant change in titration of therapies known to significantly reduce TG (GLP-1 agonists, other incretin mimetics, Phentermine/topiramate, naltrexone/bupropion, Xenical, or bariatric surgery within 3 months prior to screening  []    Change in antipsychotic meds within 30 days of screening or >453m within 60 days of Screening  []    Blood or plasma donation of 50-493mwithin 30 days of screening or>499 within 60 days of screening []   Unwilling to comply with procedures, following up, or unwillingness to cooperate fully with the investigator []   ETOH abuse or recent (<1 year) or other substance abuse []      John Mathis for Screening run in visit with [name]. He reports no abdominal pain, No recent visits to his PCP, ED, Urgent care. Consent was signed at [phone] was given time to answer questions. John StLia Foyerompleted exam at 1023. Blood drawn at 0944, Urine obtained at 1030.John  Mathis reports an allergy to contrast dye, so he will not be able to be in CTa sub study. Reviewed meds states he no longer  takes Praluent.   Current Outpatient Medications:    aspirin EC 81 MG tablet, Take 81 mg by mouth daily., Disp: , Rfl:    atorvastatin (LIPITOR) 40 MG tablet, TAKE 1 TABLET BY MOUTH EVERY DAY, Disp: 90 tablet, Rfl: 3   clopidogrel (PLAVIX) 75 MG tablet, TAKE 1 TABLET BY MOUTH EVERY DAY, Disp: 90 tablet, Rfl: 3   lisinopril (ZESTRIL) 5 MG tablet, TAKE 1 TABLET BY MOUTH EVERY DAY, Disp: 90 tablet, Rfl: 2   metFORMIN (GLUCOPHAGE) 500 MG tablet, TAKE 1 TABLET BY MOUTH 2 TIMES DAILY WITH A MEAL., Disp: 180 tablet, Rfl: 1   metoprolol succinate (TOPROL-XL) 25 MG 24 hr tablet, TAKE 1 TABLET BY MOUTH TWICE A DAY, Disp: 180 tablet, Rfl: 1   PRALUENT 150 MG/ML SOAJ, INJECT 1 PEN INTO THE SKIN EVERY 14 (FOURTEEN) DAYS. (Patient not taking: Reported on 03/08/2022), Disp: 2 mL, Rfl: 11 pt does take Praluent.

## 2022-03-09 NOTE — Progress Notes (Signed)
Patient seen today for possible screening visit for John Mathis.  He has been seen by Dr. Rennis Golden for dyslipidemia.  He was started on Vascepa but could not take due to GI side efffects.  He continues to smoke and has 13 stents.  He denies chest pain.  ? ?VSS as recorded.  No JVD.  Carotids clear. Cardiac regular.  No murmurs.  Lungs with minimal ronchii.  Ext no edema.  Neurologic non focal.  ? ?ECG normal ? ?He meets the inclusion and exclusion criteria and wishes to participate.  I have reviewed with him the drug in detail, what is known, the nature of randomization, and objectives of the study.  He wishes to participate. ? ? ?John Mathis. Alexyss Balzarini MD ?Medical Director, [facility] ?

## 2022-03-12 ENCOUNTER — Encounter: Payer: Self-pay | Admitting: *Deleted

## 2022-03-12 DIAGNOSIS — Z006 Encounter for examination for normal comparison and control in clinical research program: Secondary | ICD-10-CM

## 2022-03-12 NOTE — Research (Signed)
Message left for John Mathis to call me back about scheduling a qualification visit for [name] research study.  ?

## 2022-03-12 NOTE — Research (Addendum)
John Mathis ?Screening Run in visit -[name] ? ? ? ? ? ? ? ?[name] Abnormal Lab report  08-Mar-2022 ? ?Chemistry: ?                                ?Glucose 164   mg/dL                    [] Clinically Significant  [x] Not Clinically Significant ?Uric Acid 3.5 mg/dL                     [] Clinically Significant  [x] Not Clinically Significant ?Bilirubin (direct) 0.29 mg/dL         [] Clinically Significant  [x] Not Clinically Significant ?Hemoglobin A1c 7.3 %                [] Clinically Significant  [x] Not Clinically Significant ? ? ?Urinalysis: ?Glucose  150    mg/dL                    [] Clinically Significant  [x] Not Clinically Significant ?Protein 30 mg/dL                            [] Clinically Significant  [x] Not Clinically Significant ?Amorphous  present                       [] Clinically Significant  [x] Not Clinically Significant ?Mucus 4+                                        [] Clinically Significant  [x] Not Clinically Significant ? ? ?Lipids:  ?Total Cholesterol (ppt) 73 mg/dL       [] Clinically Significant  [x] Not Clinically Significant ?Triglyceride    224 mg/dL                   [] Clinically Significant  [x] Not Clinically Significant ?HDL 33 mg/dL                                   [] Clinically Significant  [x] Not Clinically Significant ?Apolipoprotein B  33.30 mg/dL          [] Clinically Significant  [x] Not Clinically Significant ?Non-HDL Cholesterol (calc) 40 L      [] Clinically Significant  [x] Not Clinically Significant ? ? ?Any further action needed to be taken per the PI?  No ? ? , MD, [facility], FACP  ?[place]  [facility]  ?Medical Director of the Advanced Lipid Disorders &  ?Cardiovascular Risk Reduction Clinic ?Diplomate of the of Clinical Lipidology ?Attending Cardiologist  ?Direct Dial: [phone]  Fax: [phone]  ?Website:  [email or web address] ? ? ?

## 2022-03-13 ENCOUNTER — Other Ambulatory Visit: Payer: Self-pay | Admitting: Internal Medicine

## 2022-03-13 DIAGNOSIS — E119 Type 2 diabetes mellitus without complications: Secondary | ICD-10-CM

## 2022-03-14 ENCOUNTER — Encounter: Payer: Self-pay | Admitting: *Deleted

## 2022-03-14 DIAGNOSIS — Z006 Encounter for examination for normal comparison and control in clinical research program: Secondary | ICD-10-CM

## 2022-03-14 NOTE — Research (Signed)
Spoke with John Mathis about coming in for Qualification visit. Scheduled for June 1 at 830 ?

## 2022-03-16 NOTE — Research (Addendum)
[name] Screening Run-in visit    [id number]    Are these clinically significant? []  YES              [x]  NO   , MD, [facility], FACP  [place]  [facility]  Medical Director of the Advanced Lipid Disorders &  Cardiovascular Risk Reduction Clinic Diplomate of the American Board of Clinical Lipidology Attending Cardiologist  Direct Dial: [phone]  Fax: [phone]  Website:  [email or web address]

## 2022-03-22 NOTE — Research (Addendum)
Mr Kasler reports  He takes asa for heart protection started 06-14-17 Lipitor for Dyslipidemia started 06-14-17 Plavix for blood thinner started 06-14-17 Zestril for hypertension started 06-14-17 Metformin for Type 2 Dm started 06-14-17 Toprol-xl for hypertension started 06-14-17 Praluent for Dyslipidemia started 06-14-17

## 2022-03-28 ENCOUNTER — Encounter: Payer: Self-pay | Admitting: *Deleted

## 2022-03-28 DIAGNOSIS — Z006 Encounter for examination for normal comparison and control in clinical research program: Secondary | ICD-10-CM

## 2022-03-28 NOTE — Research (Signed)
Message left to inform John Mathis of his appointment tomorrow with research at 0830.

## 2022-03-29 ENCOUNTER — Encounter: Payer: Medicare HMO | Admitting: *Deleted

## 2022-03-29 ENCOUNTER — Other Ambulatory Visit: Payer: Self-pay

## 2022-03-29 VITALS — BP 121/79 | HR 71 | Temp 98.0°F | Resp 16

## 2022-03-29 DIAGNOSIS — Z006 Encounter for examination for normal comparison and control in clinical research program: Secondary | ICD-10-CM

## 2022-03-29 NOTE — Research (Addendum)
[name] Qualification visit for [employer] Visit-[name]    Subject Number: [id number]                      Date: 29-March-2022     [x] Inclusion/Exclusion Criteria   [x] Collection of labs   [x] Assessment of ER Visits, Hospitalization and Inpatient Days  [x] Adverse Events and Concomitant Medications  Mr Wenner here for Qualification visit with [name]. He reports no changes in his medications, No ED or Urgent care visits, and no visits to PCP. No abdominal pain or other pain noted. VS taken at 0822 and blood drawn at 0826. Scheduled next visit for  June 15 at 0845.

## 2022-04-02 NOTE — Research (Addendum)
John Mathis  Qualification -essence       [name] Abnormal Lab report Month Day, Year     Lipids:  Triglyceride  223  mg/dL                        [] Clinically Significant  [x] Not Clinically Significant Total cholesterol  77 mg/dL                    [] Clinically Significant  [x] Not Clinically Significant Apolipoprotein B   31.80 mg/dL             [] Clinically Significant  [x] Not Clinically Significant Non-HDL-Cholesterol (Calc) 40 mg/dL [] Clinically Significant  [x] Not Clinically Significant    Any further action needed to be taken per the PI? No  Pixie Casino, MD, [facility], [place] Director of the Advanced Lipid Disorders &  Cardiovascular Risk Reduction Clinic Diplomate of the American Board of Clinical Lipidology Attending Cardiologist  Direct Dial: [phone]  Fax: [phone]  Website:  [email or web address]

## 2022-04-10 NOTE — Research (Addendum)
Arrick Knaak  [name]-Qualification  29-Mar-2022     Apolipoprotein B48 1.91 mg/dL       '[]'$ Clinically Significant  '[x]'$ Not Clinically Significant     Not clinically significant.  Pixie Casino, MD, [facility], [place] Director of the Advanced Lipid Disorders &  Cardiovascular Risk Reduction Clinic Diplomate of the American Board of Clinical Lipidology Attending Cardiologist  Direct Dial: [phone]  Fax: [phone]  Website:  [email or web address]

## 2022-04-11 ENCOUNTER — Encounter: Payer: Self-pay | Admitting: *Deleted

## 2022-04-11 DIAGNOSIS — Z006 Encounter for examination for normal comparison and control in clinical research program: Secondary | ICD-10-CM

## 2022-04-11 NOTE — Research (Signed)
Called to remind him of his appointment tomorrow at 0845 with research and given the parking code, also reminded nothing to eat or drink until after blood is drawn.

## 2022-04-12 ENCOUNTER — Encounter: Payer: Medicare HMO | Admitting: *Deleted

## 2022-04-12 ENCOUNTER — Other Ambulatory Visit: Payer: Self-pay

## 2022-04-12 VITALS — BP 122/81 | HR 76 | Temp 97.7°F | Resp 16 | Wt 185.4 lb

## 2022-04-12 DIAGNOSIS — Z006 Encounter for examination for normal comparison and control in clinical research program: Secondary | ICD-10-CM

## 2022-04-12 MED ORDER — STUDY - ESSENCE - OLEZARSEN 50 MG, 80 MG OR PLACEBO SQ INJECTION (PI-HILTY)
80.0000 mg | INJECTION | Freq: Once | SUBCUTANEOUS | Status: AC
  Administered 2022-04-12: 80 mg via SUBCUTANEOUS
  Filled 2022-04-12 (×4): qty 0.8

## 2022-04-12 NOTE — Progress Notes (Signed)
Patient seen today and exam repeated. Patient is stable.  He continues to qualify for the [name] trial based on laboratory studies.  Reviewed again with patient in detail.    VS; P76, BP122/81 R16 Tafebrile Lungs clear Cor regular Extremities no edema Neuro non focal    Patient randomized today with initial injection.  Will continue close follow up by protocol.   Arturo Morton. Riley Kill, MD, [facility], [facility] Medical Director, [facility]

## 2022-04-12 NOTE — Research (Unsigned)
     TREATMENT DAY 1 - STUDY WEEK 1- [name]    Subject Number: [id number]            Randomization Number:[id number] 12-April-2022    '[x]'$ Vital Signs Collected - Blood Pressure: 122/81  - Height: - Weight:185.4 lbs - Heart Rate:76 - Respiratory Rate:16 - Temperature:97.7 - Oxygen Saturation:98%  $RemoveBeforeD'[x]'LyQHCIOFXDQjtz$  Physical Exam Completed by Dr Lia Foyer   '[x]'$  12-lead ECG  $Re'[x]'fUz$  Extended Urinalysis   '[x]'$  Lab collection per protocol  $RemoveB'[x]'HgZfKgJO$  FCS Symptoms (abdominal pain only) since last visit  $Remo'[x]'OFBzQ$  Assessment of ER Visits, Hospitalizations, and Inpatient Days  $Rem'[x]'zTiB$  Adverse Events and Concomitant Medications  $RemoveBefo'[x]'KCfyZhOHlAF$  Diet, Lifestyle, and Alcohol Counseling   '[x]'$  Study Drug: [place] Injection     John Mathis here for week 1 Day 1 of [name] study. VS taken at 0839, Blood work drawn at 0850, Urine obtained at 0917, EKG at 0911. Injection was given in left arm per pt request at 0949. John Mathis was randomized to cohort B number [id number]. Kit # [id number] used. Meds reviewed  no changes noted. Reports that he feels good. No c/o abd pain, or any other pain. Pt was watched for 30 minutes after injection was given.  Current Outpatient Medications:    aspirin EC 81 MG tablet, Take 81 mg by mouth daily., Disp: , Rfl:    atorvastatin (LIPITOR) 40 MG tablet, TAKE 1 TABLET BY MOUTH EVERY DAY, Disp: 90 tablet, Rfl: 3   clopidogrel (PLAVIX) 75 MG tablet, TAKE 1 TABLET BY MOUTH EVERY DAY, Disp: 90 tablet, Rfl: 3   lisinopril (ZESTRIL) 5 MG tablet, TAKE 1 TABLET BY MOUTH EVERY DAY, Disp: 90 tablet, Rfl: 2   metFORMIN (GLUCOPHAGE) 500 MG tablet, TAKE 1 TABLET BY MOUTH 2 TIMES DAILY WITH A MEAL., Disp: 180 tablet, Rfl: 1   metoprolol succinate (TOPROL-XL) 25 MG 24 hr tablet, TAKE 1 TABLET BY MOUTH TWICE A DAY, Disp: 180 tablet, Rfl: 1   PRALUENT 150 MG/ML SOAJ, INJECT 1 PEN INTO THE SKIN EVERY 14 (FOURTEEN) DAYS., Disp: 2 mL, Rfl: 11   Study - [name] - olezarsen 50 mg, 80 mg or placebo SQ injection (PI-Hilty), Inject 80 mg into the skin every  30 (thirty) days. For Investigational Use Only. Injection subcutaneously in protocol approved injection sites (abdomen, thigh or outer area of upper arm) every 4 weeks. Medication is give in clinic. Please contact [facility] regarding any questions or concerns about this medication., Disp: , Rfl:   Current Facility-Administered Medications:    Study - [name] - olezarsen 50 mg, 80 mg or placebo SQ injection (PI-Hilty), 80 mg, Subcutaneous, Once, Hilty, Nadean Corwin, MD

## 2022-04-16 NOTE — Research (Addendum)
Oziel Bunda Week 1 Day 1-[name] 12-April-2022               Abnormal Lab report 12-April-2022  Chemistry: Glucose  154   mg/dL                      [] Clinically Significant  [x] Not Clinically Significant Uric Acid 3.4                                    [] Clinically Significant  [x] Not Clinically Significant Bilirubin (total)1.22  mg/dL              [] Clinically Significant  [x] Not Clinically Significant Bilirubin (Direct)  0.27 mg/dL          [] Clinically Significant  [x] Not Clinically Significant Hemoglobin A1c 7.3 %                    [] Clinically Significant  [x] Not Clinically Significant   Hematology: Platelet  115                                      [x] Clinically Significant  [] Not Clinically Significant   Urinalysis: Glucose   70  mg/dL                             [] Clinically Significant  [x] Not Clinically Significant Color Orange                                        [] Clinically Significant  [x] Not Clinically Significant Protein 30 mg/dL                                   [] Clinically Significant  [x] Not Clinically Significant Amorphous  Present                              [] Clinically Significant  [x] Not Clinically Significant Mucous  4+                                            [] Clinically Significant  [x] Not Clinically Significant     Urine Chemistry: Urine Creatinine 328.6 mg/dL                [] Clinically Significant  [x] Not Clinically Significant   Lipids:  Triglyceride   251   mg/dL                    [] Clinically Significant  [x] Not Clinically Significant Total Cholesterol  87 mg/dL                 [] Clinically Significant  [x] Not Clinically Significant Apolipoprotein B  35.90 mg/dL             [] Clinically Significant  [x] Not Clinically Significant Non-HDL Cholesterol (calc)  48mg /dL  [] Clinically Significant  [x] Not Clinically Significant  Any further action needed to be taken per the PI?  Yes  Platelets are low - how does this compare to  prior studies?  Most recent platelet count from 03-29-22 was 164. Has been as low as 116 from 01-27-20 in epic.  [name], MD, [facility], FACP  [place]  [facility]  Medical Director of the Advanced Lipid Disorders &  Cardiovascular Risk Reduction Clinic Diplomate of the American Board of Clinical Lipidology Attending Cardiologist  Direct Dial: [phone]  Fax: [phone]  Website:  [email or web address]

## 2022-04-23 NOTE — Research (Cosign Needed Addendum)
Leam Connole [name]- week 1 day 1 12-April-2022       Apolipoprotein B48  1.73 mg/dL    '[]'$ Clinically Significant  '[x]'$ Not Clinically Significant     Any further action needed to be taken per the PI?  No  Pixie Casino, MD, [facility], [place] Director of the Advanced Lipid Disorders &  Cardiovascular Risk Reduction Clinic Diplomate of the American Board of Clinical Lipidology Attending Cardiologist  Direct Dial: [phone]  Fax: [phone]  Website:  [email or web address]       '[]'$ Clinically Significant  '[x]'$ Not Clinically Significant  Pixie Casino, MD, [facility], [place] Director of the Advanced Lipid Disorders &  Cardiovascular Risk Reduction Clinic Diplomate of the American Board of Clinical Lipidology Attending Cardiologist  Direct Dial: [phone]  Fax: [phone]  Website:  [email or web address]

## 2022-05-03 ENCOUNTER — Telehealth: Payer: Self-pay | Admitting: Nurse Practitioner

## 2022-05-03 ENCOUNTER — Other Ambulatory Visit: Payer: Self-pay | Admitting: Nurse Practitioner

## 2022-05-03 ENCOUNTER — Telehealth: Payer: Self-pay | Admitting: Internal Medicine

## 2022-05-03 DIAGNOSIS — E119 Type 2 diabetes mellitus without complications: Secondary | ICD-10-CM

## 2022-05-03 MED ORDER — METOPROLOL SUCCINATE ER 25 MG PO TB24: 25.0000 mg | ORAL_TABLET | Freq: Two times a day (BID) | ORAL | 0 refills | Status: DC

## 2022-05-03 MED ORDER — METFORMIN HCL 500 MG PO TABS: 500.0000 mg | ORAL_TABLET | Freq: Two times a day (BID) | ORAL | 0 refills | Status: DC

## 2022-05-03 NOTE — Telephone Encounter (Signed)
Sent 30 day supply in. Will see in office

## 2022-05-03 NOTE — Telephone Encounter (Signed)
  Encourage patient to contact the pharmacy for refills or they can request refills through [facility]  Did the patient contact the pharmacy:  yes   LAST APPOINTMENT DATE:  Please schedule appointment if longer than 1 year  NEXT APPOINTMENT DATE: 10/05/2022  MEDICATION: metFORMIN (GLUCOPHAGE) 500 MG tablet  Is the patient out of medication? yes  If not, how much is left?  Is this a 90 day supply: yes  PHARMACY: [id number] [place], [place] - [address] Phone:  [phone]  Fax:  [phone]      Let patient know to contact pharmacy at the end of the day to make sure medication is ready.  Please notify patient to allow 48-72 hours to process  CLINICAL FILLS OUT ALL BELOW:   LAST REFILL:  QTY:  REFILL DATE:    OTHER COMMENTS:    Okay for refill?  Please advise

## 2022-05-03 NOTE — Telephone Encounter (Signed)
*  STAT* If patient is at the pharmacy, call can be transferred to refill team.   1. Which medications need to be refilled? (please list name of each medication and dose if known)   metFORMIN (GLUCOPHAGE) 500 MG tablet    metoprolol succinate (TOPROL-XL) 25 MG 24 hr tablet    2. Which pharmacy/location (including street and city if local pharmacy) is medication to be sent to? [id number] - [place], [place] - [address]  3. Do they need a 30 day or 90 day supply?  90 day

## 2022-05-03 NOTE — Telephone Encounter (Signed)
Requested Prescriptions   Signed Prescriptions Disp Refills   metoprolol succinate (TOPROL-XL) 25 MG 24 hr tablet 180 tablet 0    Sig: Take 1 tablet (25 mg total) by mouth 2 (two) times daily.    Authorizing Provider: END, CHRISTOPHER    Ordering User: Thayer Headings, Anapaula Severt L   Spoke with patient and advised him to get metformin refilled by his PCP. Patient expressed verbal understanding.

## 2022-05-09 ENCOUNTER — Ambulatory Visit (INDEPENDENT_AMBULATORY_CARE_PROVIDER_SITE_OTHER): Payer: Medicare HMO | Admitting: Nurse Practitioner

## 2022-05-09 VITALS — BP 128/80 | HR 90 | Temp 97.6°F | Wt 186.2 lb

## 2022-05-09 DIAGNOSIS — E119 Type 2 diabetes mellitus without complications: Secondary | ICD-10-CM | POA: Diagnosis not present

## 2022-05-09 DIAGNOSIS — I10 Essential (primary) hypertension: Secondary | ICD-10-CM

## 2022-05-09 DIAGNOSIS — Z125 Encounter for screening for malignant neoplasm of prostate: Secondary | ICD-10-CM | POA: Diagnosis not present

## 2022-05-09 DIAGNOSIS — Z1211 Encounter for screening for malignant neoplasm of colon: Secondary | ICD-10-CM | POA: Diagnosis not present

## 2022-05-09 DIAGNOSIS — Z122 Encounter for screening for malignant neoplasm of respiratory organs: Secondary | ICD-10-CM | POA: Diagnosis not present

## 2022-05-09 DIAGNOSIS — E782 Mixed hyperlipidemia: Secondary | ICD-10-CM

## 2022-05-09 DIAGNOSIS — E1169 Type 2 diabetes mellitus with other specified complication: Secondary | ICD-10-CM

## 2022-05-09 LAB — POCT GLYCOSYLATED HEMOGLOBIN (HGB A1C): Hemoglobin A1C: 8.1 % — AB (ref 4.0–5.6)

## 2022-05-09 MED ORDER — METFORMIN HCL 500 MG PO TABS: 1000.0000 mg | ORAL_TABLET | Freq: Two times a day (BID) | ORAL | 1 refills | Status: DC

## 2022-05-09 NOTE — Assessment & Plan Note (Addendum)
Blood pressure within normal limits.  Patient is followed by cardiology.  Continue taking medication as prescribed.  Currently maintained on lisinopril and metoprolol

## 2022-05-09 NOTE — Assessment & Plan Note (Signed)
Patient's A1c was up today with 8.1% in office.  We will titrate metformin from 1000 mg daily to 2000 mg daily discussed this with patient in office he acknowledged.  We will follow-up in 4 months for A1c recheck

## 2022-05-09 NOTE — Progress Notes (Signed)
Established Patient Office Visit  Subjective   Patient ID: John Mathis, male    DOB: 09-Apr-1961  Age: 61 y.o. MRN: [id number]  Chief Complaint  Patient presents with   Follow-up    DM     HPI  DM2: does not check sugars at home.  Eat 3 meals a day with snacking in between. States that yesterdays snack he ate 1 "ding-dong" states that he will eat a pack of crackers.  Drinks coffee and water. Does drink cream and sugar  Exercise: walks the dog 4-5 times a day 1-1.5 miles total  every day.    Review of Systems  Constitutional:  Negative for chills, fever and malaise/fatigue.  Respiratory:  Negative for shortness of breath.   Cardiovascular:  Negative for chest pain and leg swelling.  Gastrointestinal:  Negative for abdominal pain, blood in stool, constipation, diarrhea, nausea and vomiting.       Bm daily  Genitourinary:  Negative for dysuria and hematuria.  Psychiatric/Behavioral:  Negative for hallucinations and suicidal ideas.       Objective:     BP 128/80   Pulse 90   Temp 97.6 F (36.4 C) (Temporal)   Wt 186 lb 4 oz (84.5 kg)   SpO2 97%   BMI 25.98 kg/m  BP Readings from Last 3 Encounters:  05/09/22 128/80  04/12/22 122/81  03/29/22 121/79   Wt Readings from Last 3 Encounters:  05/09/22 186 lb 4 oz (84.5 kg)  04/12/22 185 lb 6.4 oz (84.1 kg)  03/08/22 187 lb 12.8 oz (85.2 kg)      Physical Exam Vitals and nursing note reviewed.  Constitutional:      Appearance: Normal appearance.  HENT:     Mouth/Throat:     Mouth: Mucous membranes are moist.     Pharynx: Oropharynx is clear.  Cardiovascular:     Rate and Rhythm: Normal rate and regular rhythm.     Pulses: Normal pulses.     Heart sounds: Normal heart sounds.  Pulmonary:     Effort: Pulmonary effort is normal.     Breath sounds: Normal breath sounds.  Abdominal:     General: Bowel sounds are normal.  Musculoskeletal:     Right lower leg: No edema.     Left lower leg: No edema.        Feet:  Skin:    General: Skin is warm.  Neurological:     Mental Status: He is alert.      Results for orders placed or performed in visit on 05/09/22  POCT glycosylated hemoglobin (Hb A1C)  Result Value Ref Range   Hemoglobin A1C 8.1 (A) 4.0 - 5.6 %   HbA1c POC (<> result, manual entry)     HbA1c, POC (prediabetic range)     HbA1c, POC (controlled diabetic range)        The ASCVD Risk score (Arnett DK, et al., 2019) failed to calculate for the following reasons:   The valid total cholesterol range is 130 to 320 mg/dL    Assessment & Plan:   Problem List Items Addressed This Visit       Cardiovascular and Mediastinum   Essential hypertension    Blood pressure within normal limits.  Patient is followed by cardiology.  Continue taking medication as prescribed.  Currently maintained on lisinopril and metoprolol        Endocrine   Type 2 diabetes mellitus with other specified complication (HCC)    Patient's A1c was up  today with 8.1% in office.  We will titrate metformin from 1000 mg daily to 2000 mg daily discussed this with patient in office he acknowledged.  We will follow-up in 4 months for A1c recheck      Relevant Medications   metFORMIN (GLUCOPHAGE) 500 MG tablet     Other   Mixed hyperlipidemia    Patient currently in a investigational study for lipid-lowering medication.  Continue doing as directed      Other Visit Diagnoses     Type 2 diabetes mellitus without complication, without long-term current use of insulin (HCC)    -  Primary   Relevant Medications   metFORMIN (GLUCOPHAGE) 500 MG tablet   Other Relevant Orders   POCT glycosylated hemoglobin (Hb A1C) (Completed)   CBC   Comprehensive metabolic panel   Screening for prostate cancer       Relevant Orders   PSA   Screening for colon cancer       Relevant Orders   Cologuard   Screening for lung cancer       Relevant Orders   Ambulatory Referral Lung Cancer Screening [facility]        Return in about 4 months (around 09/09/2022) for Bm recheck.    Audria Nine, NP

## 2022-05-09 NOTE — Assessment & Plan Note (Signed)
Patient currently in a investigational study for lipid-lowering medication.  Continue doing as directed

## 2022-05-09 NOTE — Patient Instructions (Addendum)
We are going to increase your metformin from 1 pill twice a day to 2 pills twice a day, so a total of 4 pills a day of (2,000 mg) a day  I will be in touch with the labs once I have the results I want to see you in 4 months for a follow up on your sugar level Your A1C is 8.1 in office today

## 2022-05-10 ENCOUNTER — Encounter: Payer: Medicare HMO | Admitting: *Deleted

## 2022-05-10 VITALS — BP 130/80 | HR 72 | Temp 98.0°F | Resp 18

## 2022-05-10 DIAGNOSIS — Z006 Encounter for examination for normal comparison and control in clinical research program: Secondary | ICD-10-CM

## 2022-05-10 LAB — COMPREHENSIVE METABOLIC PANEL
ALT: 24 U/L (ref 0–53)
AST: 19 U/L (ref 0–37)
Albumin: 4.8 g/dL (ref 3.5–5.2)
Alkaline Phosphatase: 122 U/L — ABNORMAL HIGH (ref 39–117)
BUN: 11 mg/dL (ref 6–23)
CO2: 25 mEq/L (ref 19–32)
Calcium: 9.8 mg/dL (ref 8.4–10.5)
Chloride: 102 mEq/L (ref 96–112)
Creatinine, Ser: 0.75 mg/dL (ref 0.40–1.50)
GFR: 97.98 mL/min (ref >60.00)
Glucose, Bld: 212 mg/dL — ABNORMAL HIGH (ref 70–99)
Potassium: 3.7 mEq/L (ref 3.5–5.1)
Sodium: 136 mEq/L (ref 135–145)
Total Bilirubin: 0.9 mg/dL (ref 0.2–1.2)
Total Protein: 7 g/dL (ref 6.0–8.3)

## 2022-05-10 LAB — CBC
HCT: 50.6 % (ref 39.0–52.0)
Hemoglobin: 17.3 g/dL — ABNORMAL HIGH (ref 13.0–17.0)
MCHC: 34.3 g/dL (ref 30.0–36.0)
MCV: 91.1 fl (ref 78.0–100.0)
Platelets: 166 10*3/uL (ref 150.0–400.0)
RBC: 5.56 Mil/uL (ref 4.22–5.81)
RDW: 13 % (ref 11.5–15.5)
WBC: 7.1 10*3/uL (ref 4.0–10.5)

## 2022-05-10 LAB — PSA: PSA: 0.84 ng/mL (ref 0.10–4.00)

## 2022-05-10 MED ORDER — STUDY - ESSENCE - OLEZARSEN 50 MG, 80 MG OR PLACEBO SQ INJECTION (PI-HILTY)
80.0000 mg | INJECTION | Freq: Once | SUBCUTANEOUS | Status: AC
  Administered 2022-05-10: 80 mg via SUBCUTANEOUS
  Filled 2022-05-10: qty 0.8

## 2022-05-10 NOTE — Research (Signed)
[name]  Week 5  Patient doing well, no complaints today. Meds reviewd with patient and the only change is his metformin was increased to 2000 mg daily.   Blood work 6712 Urine collected at 0828  Study drug given @ 0931 Right arm  Patient monitored after injection and felt great.   Will call with follow up appt

## 2022-05-14 NOTE — Research (Addendum)
[place] Week 5 Day 29- [name] 10-May-2022         Chemistry: Glucose   145  mg/dL                  [] Clinically Significant  [x] Not Clinically Significant Magnesium 1.6 mg/dL                 [x] Clinically Significant  [] Not Clinically Significant Uric Acid  3.9 mg/dL                    [] Clinically Significant  [x] Not Clinically Significant Bilirubin (direct) 0.36  mg/dL       [] Clinically Significant  [x] Not Clinically Significant   Urinalysis: Glucose 70    mg/dL            [] Clinically Significant  [x] Not Clinically Significant Color orange                       [] Clinically Significant  [x] Not Clinically Significant Protein 30 mg/dL                 [] Clinically Significant  [x] Not Clinically Significant Amorphous present             [] Clinically Significant  [x] Not Clinically Significant Mucus 4+                             [] Clinically Significant  [x] Not Clinically Significant  Urine Chemistry: Urine Creatinine 273.9 mg/dL         [] Clinically Significant  [x] Not Clinically Significant   Any further action needed to be taken per the PI? Yes  Magnesium is low - should coordinate with PCP to replete  , MD, [facility], FACP  [place]  [facility]  Medical Director of the Advanced Lipid Disorders &  Cardiovascular Risk Reduction Clinic Diplomate of the American Board of Clinical Lipidology Attending Cardiologist  Direct Dial: [phone]  Fax: [phone]  Website:  [email or web address]

## 2022-05-18 NOTE — Research (Addendum)
John Mathis John Ito, NP messaged Mag results

## 2022-05-22 ENCOUNTER — Telehealth: Payer: Self-pay | Admitting: Nurse Practitioner

## 2022-05-22 MED ORDER — MAGNESIUM OXIDE -MG SUPPLEMENT 200 MG PO TABS: 200.0000 mg | ORAL_TABLET | Freq: Every day | ORAL | 0 refills | Status: DC

## 2022-05-22 NOTE — Telephone Encounter (Signed)
I was notified from Dr Rennis Golden that patients magnesium was low. I will send in some magnesium for him to take and then we can recheck after he finishes it in a week

## 2022-05-23 NOTE — Telephone Encounter (Signed)
Patient advised and lab appointment made for 05/31/22

## 2022-05-26 LAB — COLOGUARD: COLOGUARD: NEGATIVE

## 2022-05-31 ENCOUNTER — Other Ambulatory Visit (INDEPENDENT_AMBULATORY_CARE_PROVIDER_SITE_OTHER): Payer: Medicare HMO

## 2022-05-31 LAB — MAGNESIUM: Magnesium: 1.6 mg/dL (ref 1.5–2.5)

## 2022-06-01 ENCOUNTER — Telehealth: Payer: Self-pay | Admitting: Nurse Practitioner

## 2022-06-01 NOTE — Telephone Encounter (Signed)
-----   Message from [facility], [place] sent at 06/01/2022  4:40 PM EDT ----- Left message to call back Last RX was on 05/22/22 for 200 mg 1 tablet once daily #7. To clarify patient is to take 1 tablet once daily or one tablet once a week for 2 weeks? I think he will need more but waiting to hear back from patient

## 2022-06-01 NOTE — Telephone Encounter (Signed)
Noted waiting for call back from patient

## 2022-06-01 NOTE — Telephone Encounter (Signed)
One tablet daily for 2 weeks. It is an over the counter product

## 2022-06-04 ENCOUNTER — Telehealth: Payer: Self-pay | Admitting: Internal Medicine

## 2022-06-04 NOTE — Telephone Encounter (Signed)
Left message to call back  

## 2022-06-04 NOTE — Telephone Encounter (Signed)
PA for Praluent submitted via CMM Key: [id number] - PA Case ID: [id number]

## 2022-06-04 NOTE — Telephone Encounter (Signed)
Patient called back. Verified that he has received information. He had no further questions at this time.

## 2022-06-04 NOTE — Telephone Encounter (Signed)
Request Reference Number: [id number]. PRALUENT INJ 150MG /ML is approved through 06/05/2023.

## 2022-06-04 NOTE — Telephone Encounter (Signed)
[facility] Night - Client Nonclinical Telephone Record  [facility] Client [facility] Night - Client Client Site [facility] - Night Provider Audria Nine- NP Contact Type Call Who Is Calling Patient / Member / Family / Caregiver Caller Name Glenard Keesling Caller Phone Number [phone] Call Type Message Only Information Provided Reason for Call Returning a Call from the Office Initial Comment Caller states they missed a call from the office. Disp. Time Disposition Final User 06/01/2022 5:03:35 PM General Information Provided Yes [place], [place] Call Closed By: Maralyn Sago Transaction Date/Time: 06/01/2022 5:00:59 PM (ET

## 2022-06-06 ENCOUNTER — Encounter: Payer: Self-pay | Admitting: *Deleted

## 2022-06-06 DIAGNOSIS — Z006 Encounter for examination for normal comparison and control in clinical research program: Secondary | ICD-10-CM

## 2022-06-06 NOTE — Research (Signed)
Message left for John Mathis to remind him of his appointment tomorrow at 0830 with Research. Reminded him to be NPO except water, and gave him the parking code.

## 2022-06-07 ENCOUNTER — Encounter: Payer: Medicare HMO | Admitting: *Deleted

## 2022-06-07 ENCOUNTER — Other Ambulatory Visit: Payer: Self-pay

## 2022-06-07 VITALS — BP 119/71 | HR 68 | Temp 97.6°F | Resp 18

## 2022-06-07 DIAGNOSIS — Z006 Encounter for examination for normal comparison and control in clinical research program: Secondary | ICD-10-CM

## 2022-06-07 MED ORDER — STUDY - ESSENCE - OLEZARSEN 50 MG, 80 MG OR PLACEBO SQ INJECTION (PI-HILTY)
80.0000 mg | INJECTION | Freq: Once | SUBCUTANEOUS | Status: AC
  Administered 2022-06-07: 80 mg via SUBCUTANEOUS
  Filled 2022-06-07: qty 0.8

## 2022-06-07 NOTE — Research (Addendum)
John Mathis [name] Week 9 day 57 visit 07-Jun-2022    Subject Number: [id number]             Randomization Number:[id number]             Date:0-Aug-2023      [x] Vital Signs Collected - Blood Pressure: 119/71 - Heart Rate:68 - Respiratory Rate:18 - Temperature:97.6 - Oxygen Saturation:96%   [x]  Extended Urinalysis   [x]  Lab collection per protocol  [x]  (abdominal pain only) since last visit  [x]  Assessment of ER Visits, Hospitalizations, and Inpatient Days  [x]  Adverse Events and Concomitant Medications  [x]  Diet, Lifestyle, and Alcohol Counseling   [x]  Study Drug: [place] Injection    John Mathis here for Week 9 Day 37 essence research visit. He states he feels good, no adb pain, or other pain at this time. No Ed or Urgent care visit since last seen in research. VS taken at 0805, Blood drawn at 0812, and urine obtained at 0853. Injection was given in left arm per pt request at 0848. Reports a change in his metformin. Metformin was increased to 1000 mg 2 times  daily.  Pt also is taking Magnesium 200 mg daily for 2 weeks. Next visit scheduled for Sept 7 at 0900.  Current Outpatient Medications:    aspirin EC 81 MG tablet, Take 81 mg by mouth daily., Disp: , Rfl:    atorvastatin (LIPITOR) 40 MG tablet, TAKE 1 TABLET BY MOUTH EVERY DAY, Disp: 90 tablet, Rfl: 3   clopidogrel (PLAVIX) 75 MG tablet, TAKE 1 TABLET BY MOUTH EVERY DAY, Disp: 90 tablet, Rfl: 3   lisinopril (ZESTRIL) 5 MG tablet, TAKE 1 TABLET BY MOUTH EVERY DAY, Disp: 90 tablet, Rfl: 2   Magnesium Oxide -Mg Supplement 200 MG TABS, Take 1 tablet (200 mg total) by mouth daily., Disp: 7 tablet, Rfl: 0   metFORMIN (GLUCOPHAGE) 500 MG tablet, Take 2 tablets (1,000 mg total) by mouth 2 (two) times daily with a meal., Disp: 360 tablet, Rfl: 1   metoprolol succinate (TOPROL-XL) 25 MG 24 hr tablet, Take 1 tablet (25 mg total) by mouth 2 (two) times daily., Disp: 180 tablet, Rfl: 0   PRALUENT 150 MG/ML SOAJ, INJECT 1 PEN INTO THE SKIN EVERY  14 (FOURTEEN) DAYS., Disp: 2 mL, Rfl: 11   Study - [name] - olezarsen 50 mg, 80 mg or placebo SQ injection (PI-Hilty), Inject 80 mg into the skin every 30 (thirty) days. For Investigational Use Only. Injection subcutaneously in protocol approved injection sites (abdomen, thigh or outer area of upper arm) every 4 weeks. Medication is give in clinic. Please contact [facility] regarding any questions or concerns about this medication., Disp: , Rfl:   Current Facility-Administered Medications:    Study - [name] - olezarsen 50 mg, 80 mg or placebo SQ injection (PI-Hilty), 80 mg, Subcutaneous, Once, Hilty, , MD

## 2022-06-11 NOTE — Research (Addendum)
John Mathis [name] week 9 day 57 07-Jun-2022         Chemistry: Glucose  147   mg/dL                [] Clinically Significant  [x] Not Clinically Significant Uric Acid 3.4 mg/dL                   [] Clinically Significant  [x] Not Clinically Significant  Urinalysis: Glucose  50    mg/dL                 [] Clinically Significant  [x] Not Clinically Significant Color Dark Yellow                     [] Clinically Significant  [x] Not Clinically Significant Protein 30   mg/dL                     [] Clinically Significant  [x] Not Clinically Significant Amorphous present                   [] Clinically Significant  [x] Not Clinically Significant Mucus 4+                                   [] Clinically Significant  [x] Not Clinically Significant  Urine Chemistry: Urine Creatinine 283.0 mg/dL       [] Clinically Significant  [x] Not Clinically Significant     Any further action needed to be taken per the PI? No  , MD, [facility], FACP  [place]  [facility]  Medical Director of the Advanced Lipid Disorders &  Cardiovascular Risk Reduction Clinic Diplomate of the American Board of Clinical Lipidology Attending Cardiologist  Direct Dial: [phone]  Fax: [phone]  Website:  [email or web address]

## 2022-06-18 NOTE — Research (Addendum)
John Mathis [name] Week 9 Day 57 07-Jun-2022

## 2022-06-28 NOTE — Telephone Encounter (Signed)
Left VM and call back info for scheduling annual LDCT ?

## 2022-07-04 ENCOUNTER — Encounter: Payer: Self-pay | Admitting: *Deleted

## 2022-07-04 DIAGNOSIS — Z006 Encounter for examination for normal comparison and control in clinical research program: Secondary | ICD-10-CM

## 2022-07-04 NOTE — Research (Signed)
Message left to remind [name] of his appointment tomorrow at 0900 with research. Also reminded him to be NPO and gave him the parking code.

## 2022-07-05 ENCOUNTER — Other Ambulatory Visit: Payer: Self-pay

## 2022-07-05 ENCOUNTER — Encounter: Payer: Medicare HMO | Admitting: *Deleted

## 2022-07-05 VITALS — BP 131/87 | HR 69 | Temp 97.2°F | Resp 18

## 2022-07-05 DIAGNOSIS — Z006 Encounter for examination for normal comparison and control in clinical research program: Secondary | ICD-10-CM

## 2022-07-05 MED ORDER — STUDY - ESSENCE - OLEZARSEN 50 MG, 80 MG OR PLACEBO SQ INJECTION (PI-HILTY)
80.0000 mg | INJECTION | Freq: Once | SUBCUTANEOUS | Status: AC
  Administered 2022-07-05: 80 mg via SUBCUTANEOUS
  Filled 2022-07-05: qty 0.8

## 2022-07-05 NOTE — Research (Signed)
     TREATMENT DAY 85 - STUDY WEEK 13    Subject Number: [id number]             Randomization Number:[id number]            Date:7-Sept-2023     [x] Vital Signs Collected - Blood Pressure:131/87 - Heart Rate:69 - Respiratory Rate:18 - Temperature:97.2 - Oxygen Saturation:98%  [x]  Extended Urinalysis   [x]  Lab collection per protocol  [x]  (abdominal pain only) since last visit  [x]  Assessment of ER Visits, Hospitalizations, and Inpatient Days  [x]  Adverse Events and Concomitant Medications  [x]  Diet, Lifestyle, and Alcohol Counseling   [x]  Study Drug: [place] Injection    John Mathis here for Week 13 Day 48 of [name] study. He reports no abd pain, or any other pain, no visits to the Ed or Urgent care since last seen. VS taken at 0855, Blood drawn at 0904, and urine obtained at 0910. Injection given in left arm at 0936 tol well. Next appointment scheduled for Oct 5 at 090. No changes in medication.   Current Outpatient Medications:    aspirin EC 81 MG tablet, Take 81 mg by mouth daily., Disp: , Rfl:    atorvastatin (LIPITOR) 40 MG tablet, TAKE 1 TABLET BY MOUTH EVERY DAY, Disp: 90 tablet, Rfl: 3   clopidogrel (PLAVIX) 75 MG tablet, TAKE 1 TABLET BY MOUTH EVERY DAY, Disp: 90 tablet, Rfl: 3   lisinopril (ZESTRIL) 5 MG tablet, TAKE 1 TABLET BY MOUTH EVERY DAY, Disp: 90 tablet, Rfl: 2   Magnesium Oxide -Mg Supplement 200 MG TABS, Take 1 tablet (200 mg total) by mouth daily., Disp: 7 tablet, Rfl: 0   metFORMIN (GLUCOPHAGE) 500 MG tablet, Take 2 tablets (1,000 mg total) by mouth 2 (two) times daily with a meal., Disp: 360 tablet, Rfl: 1   metoprolol succinate (TOPROL-XL) 25 MG 24 hr tablet, Take 1 tablet (25 mg total) by mouth 2 (two) times daily., Disp: 180 tablet, Rfl: 0   PRALUENT 150 MG/ML SOAJ, INJECT 1 PEN INTO THE SKIN EVERY 14 (FOURTEEN) DAYS., Disp: 2 mL, Rfl: 11   Study - [name] - olezarsen 50 mg, 80 mg or placebo SQ injection (PI-Hilty), Inject 80 mg into the skin every 30 (thirty) days.  For Investigational Use Only. Injection subcutaneously in protocol approved injection sites (abdomen, thigh or outer area of upper arm) every 4 weeks. Medication is give in clinic. Please contact [facility] regarding any questions or concerns about this medication., Disp: , Rfl:

## 2022-07-09 NOTE — Research (Addendum)
[place] Week 13 Day 85 -[name] 07-Sept-2023           Chemistry: Glucose 131    mg/dL               [] Clinically Significant  [x] Not Clinically Significant Uric Acid 3.7 mg/dL                  [] Clinically Significant  [x] Not Clinically Significant Bilirubin (direct) 0.30 mg/dL      [] Clinically Significant  [x] Not Clinically Significant      Any further action needed to be taken per the PI?  No  , MD, [facility], FACP  [place]  [facility]  Medical Director of the Advanced Lipid Disorders &  Cardiovascular Risk Reduction Clinic Diplomate of the American Board of Clinical Lipidology Attending Cardiologist  Direct Dial: [phone]  Fax: [phone]  Website:  [email or web address]

## 2022-07-17 NOTE — Research (Addendum)
John Mathis [name] Week 13 Day [id number] 07-Sept-2023

## 2022-07-30 ENCOUNTER — Other Ambulatory Visit: Payer: Self-pay | Admitting: Internal Medicine

## 2022-08-01 ENCOUNTER — Encounter: Payer: Self-pay | Admitting: Internal Medicine

## 2022-08-01 ENCOUNTER — Ambulatory Visit: Payer: Medicare HMO | Attending: Internal Medicine | Admitting: Internal Medicine

## 2022-08-01 VITALS — BP 102/68 | HR 67 | Ht 71.0 in | Wt 185.0 lb

## 2022-08-01 DIAGNOSIS — I10 Essential (primary) hypertension: Secondary | ICD-10-CM

## 2022-08-01 DIAGNOSIS — I255 Ischemic cardiomyopathy: Secondary | ICD-10-CM

## 2022-08-01 DIAGNOSIS — E785 Hyperlipidemia, unspecified: Secondary | ICD-10-CM

## 2022-08-01 DIAGNOSIS — I5022 Chronic systolic (congestive) heart failure: Secondary | ICD-10-CM

## 2022-08-01 DIAGNOSIS — E1169 Type 2 diabetes mellitus with other specified complication: Secondary | ICD-10-CM

## 2022-08-01 DIAGNOSIS — I2510 Atherosclerotic heart disease of native coronary artery without angina pectoris: Secondary | ICD-10-CM

## 2022-08-01 DIAGNOSIS — I259 Chronic ischemic heart disease, unspecified: Secondary | ICD-10-CM

## 2022-08-01 DIAGNOSIS — Z006 Encounter for examination for normal comparison and control in clinical research program: Secondary | ICD-10-CM

## 2022-08-01 MED ORDER — METOPROLOL SUCCINATE ER 25 MG PO TB24: 25.0000 mg | ORAL_TABLET | Freq: Two times a day (BID) | ORAL | 2 refills | Status: DC

## 2022-08-01 NOTE — Research (Signed)
Called patient to remind him of his appointment on 08/02/2022 @ 9am. Left a message on voice mail along with parking code and call back number.

## 2022-08-01 NOTE — Progress Notes (Unsigned)
Follow-up Outpatient Visit Date: 08/01/2022  Primary Care Provider: Michela Pitcher, NP [place] [address]  Chief Complaint: Follow-up coronary artery disease and cardiomyopathy  HPI:  John Mathis is a 61 y.o. male with history of CAD with report of 41 prior stent placements while living in [place], ischemic cardiomyopathy with LVEF of 30-35% by echo in 2017 but found to be 50-55% in 10/2017, hypertension, hyperlipidemia, diabetes mellitus, and Lyme disease, who presents for follow-up of coronary artery disease.  I last saw him a year ago, which time he was feeling well.  He has been following closely with Dr. Debara Pickett in our lipid clinic and is also participating in the [name] research study.  Today, John Mathis reports that he has been feeling well other than some mild joint pains.  He denies chest pain, shortness of breath, palpitations, lightheadedness, and edema.  His metformin was recently increased due to elevated hemoglobin A1c in the setting of dietary indiscretion.  He did not tolerate this well due to loose stools and associated colitis flare.  He has backed off on metformin and is working on dietary changes to help improve his glycemic control.  --------------------------------------------------------------------------------------------------  Cardiovascular History & Procedures: Cardiovascular Problems: Coronary artery disease status post multiple PCI's Chronic systolic heart failure secondary to ischemic cardiomyopathy   Risk Factors: Known coronary artery disease, hypertension, hyperlipidemia, diabetes mellitus, male gender, and age greater than 54   Cath/PCI: None available.  Patient reports a total of 14 stents from 2012 through 2014   CV Surgery: None   EP Procedures and Devices: None   Non-Invasive Evaluation(s): TTE (11/18/17): Mildly dilated LV with normal wall thickness.  LVEF 50-55% with normal wall motion and diastolic function.  Normal RV  size and function. TTE (01/18/16, [facility], [place], [place]): Normal LV size.  LVEF 30-35% with inferior hypokinesis.  Trace aortic and mild mitral regurgitation.  Trace to mild tricuspid regurgitation.  Normal RV size and function.  Recent CV Pertinent Labs: Lab Results  Component Value Date   CHOL 75 (L) 12/05/2021   HDL 35 (L) 12/05/2021   LDLCALC 9 12/05/2021   LDLDIRECT 22.0 06/27/2021   TRIG 196 (H) 12/05/2021   CHOLHDL 2.1 12/05/2021   CHOLHDL 2 06/27/2021   INR 1.00 05/19/2018   K 3.7 05/09/2022   MG 1.6 05/31/2022   BUN 11 05/09/2022   BUN 13 06/14/2017   CREATININE 0.75 05/09/2022    Past medical and surgical history were reviewed and updated in EPIC.  Current Meds  Medication Sig   aspirin EC 81 MG tablet Take 81 mg by mouth daily.   atorvastatin (LIPITOR) 40 MG tablet TAKE 1 TABLET BY MOUTH EVERY DAY   clopidogrel (PLAVIX) 75 MG tablet TAKE 1 TABLET BY MOUTH EVERY DAY   lisinopril (ZESTRIL) 5 MG tablet TAKE 1 TABLET BY MOUTH EVERY DAY   Magnesium Oxide -Mg Supplement 200 MG TABS Take 1 tablet (200 mg total) by mouth daily.   metFORMIN (GLUCOPHAGE) 500 MG tablet Take 2 tablets (1,000 mg total) by mouth 2 (two) times daily with a meal. (Patient taking differently: Take 1,000 mg by mouth 2 (two) times daily with a meal. 2 pills morning, 1 pill pm)   metoprolol succinate (TOPROL-XL) 25 MG 24 hr tablet Take 1 tablet (25 mg total) by mouth 2 (two) times daily. Patient must keep appointment for 08/01/22 for further refills. 1st attempt   PRALUENT 150 MG/ML SOAJ INJECT 1 PEN  INTO THE SKIN EVERY 14 (FOURTEEN) DAYS.   Study - [name] - olezarsen 50 mg, 80 mg or placebo SQ injection (PI-Hilty) Inject 80 mg into the skin every 30 (thirty) days. For Investigational Use Only. Injection subcutaneously in protocol approved injection sites (abdomen, thigh or outer area of upper arm) every 4 weeks. Medication is give in clinic. Please contact [facility] regarding any questions or concerns about this medication.    Allergies: Other, Icosapent ethyl, and Ivp dye [iodinated contrast media]  Social History   Tobacco Use   Smoking status: Every Day    Packs/day: 1.25    Years: 43.00    Total pack years: 53.75    Types: Cigarettes   Smokeless tobacco: Never  Substance Use Topics   Alcohol use: No   Drug use: No    Family History  Problem Relation Age of Onset   Hyperlipidemia Mother    Other Father        parasite infection   Mental illness Maternal Grandmother     Review of Systems: A 12-system review of systems was performed and was negative except as noted in the HPI.  --------------------------------------------------------------------------------------------------  Physical Exam: BP 102/68 (BP Location: Left Arm, Patient Position: Sitting, Cuff Size: Normal)   Pulse 67   Ht 5\' 11"  (1.803 m)   Wt 185 lb (83.9 kg)   SpO2 97%   BMI 25.80 kg/m   General:  NAD. Neck: No JVD or HJR. Lungs: Clear to auscultation bilaterally without wheezes or crackles. Heart: Regular rate and rhythm without murmurs, rubs, or gallops. Abdomen: Soft, nontender, nondistended. Extremities: No lower extremity edema.  EKG: Normal sinus rhythm with inferior infarct.  No significant change from prior tracing on 07/19/2021.  Lab Results  Component Value Date   WBC 7.1 05/09/2022   HGB 17.3 (H) 05/09/2022   HCT 50.6 05/09/2022   MCV 91.1 05/09/2022   PLT 166.0 05/09/2022    Lab Results  Component Value Date   NA 136 05/09/2022   K 3.7 05/09/2022   CL 102 05/09/2022   CO2 25 05/09/2022   BUN 11 05/09/2022   CREATININE 0.75 05/09/2022   GLUCOSE 212 (H) 05/09/2022   ALT 24 05/09/2022    Lab Results  Component Value Date   CHOL 75 (L) 12/05/2021   HDL 35 (L) 12/05/2021   LDLCALC 9 12/05/2021   LDLDIRECT 22.0 06/27/2021   TRIG 196 (H) 12/05/2021   CHOLHDL 2.1 12/05/2021     --------------------------------------------------------------------------------------------------  ASSESSMENT AND PLAN: Coronary artery disease: John Mathis continues to do well without recurrent angina in the setting of multiple prior PCI's.  We will continue his current medications for secondary prevention.  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  Continue current doses of metoprolol and lisinopril as borderline low blood pressure precludes dose escalation.  Hyperlipidemia associated with type 2 diabetes mellitus: Continue current medications and close follow-up with Dr. Lindaann Slough as well as the essence research trial.  I encouraged him to work on his diet to help improve his glycemic control so that his metformin can hopefully be reduced again in the future.  Ongoing management of DM per John Mathis.  Hypertension: Blood pressure borderline low today, albeit without symptoms.  No medication changes at this time.  Follow-up: Return to clinic in 1 year.  Rennis Golden, MD 08/01/2022 4:23 PM

## 2022-08-01 NOTE — Patient Instructions (Signed)
Medication Instructions:   Your physician recommends that you continue on your current medications as directed. Please refer to the Current Medication list given to you today.   *If you need a refill on your cardiac medications before your next appointment, please call your pharmacy*    Follow-Up: At [facility], you and your health needs are our priority.  As part of our continuing mission to provide you with exceptional heart care, we have created designated Provider Care Teams.  These Care Teams include your primary Cardiologist (physician) and Advanced Practice Providers (APPs -  Physician Assistants and Nurse Practitioners) who all work together to provide you with the care you need, when you need it.  We recommend signing up for the patient portal called "MyChart".  Sign up information is provided on this After Visit Summary.  MyChart is used to connect with patients for Virtual Visits (Telemedicine).  Patients are able to view lab/test results, encounter notes, upcoming appointments, etc.  Non-urgent messages can be sent to your provider as well.   To learn more about what you can do with MyChart, go to [email or web address].com.    Your next appointment:   1 year(s)  The format for your next appointment:   In Person  Provider:   You may see Christopher End, MD or one of the following Advanced Practice Providers on your designated Care Team:   Christopher Berge, NP Ryan Dunn, PA-C Cadence Furth, PA-C Sheri Hammock, NP    Other Instructions   Important Information About Sugar       

## 2022-08-02 ENCOUNTER — Encounter: Payer: Medicare HMO | Admitting: *Deleted

## 2022-08-02 ENCOUNTER — Encounter: Payer: Self-pay | Admitting: Internal Medicine

## 2022-08-02 DIAGNOSIS — Z006 Encounter for examination for normal comparison and control in clinical research program: Secondary | ICD-10-CM

## 2022-08-02 MED ORDER — STUDY - ESSENCE - OLEZARSEN 50 MG, 80 MG OR PLACEBO SQ INJECTION (PI-HILTY)
80.0000 mg | INJECTION | Freq: Once | SUBCUTANEOUS | Status: AC
  Administered 2022-08-02: 80 mg via SUBCUTANEOUS
  Filled 2022-08-02: qty 0.8

## 2022-08-02 NOTE — Research (Signed)
[name] Informed Consent   Subject Name: John Mathis  Subject met inclusion and exclusion criteria.  The informed consent form, study requirements and expectations were reviewed with the subject and questions and concerns were addressed prior to the signing of the consent form.  The subject verbalized understanding of the trial requirements.  The subject agreed to participate in the [name] trial and signed the informed consent at [place] on 02-Aug-2022.  The informed consent was obtained prior to performance of any protocol-specific procedures for the subject.  A copy of the signed informed consent was given to the subject and a copy was placed in the subject's medical record.   Philemon Kingdom D

## 2022-08-02 NOTE — Research (Signed)
Wk 17  Patient doing well. No complaints.   Injection gave - right arm  No problems or side effects    Current Outpatient Medications:    aspirin EC 81 MG tablet, Take 81 mg by mouth daily., Disp: , Rfl:    atorvastatin (LIPITOR) 40 MG tablet, TAKE 1 TABLET BY MOUTH EVERY DAY, Disp: 90 tablet, Rfl: 3   clopidogrel (PLAVIX) 75 MG tablet, TAKE 1 TABLET BY MOUTH EVERY DAY, Disp: 90 tablet, Rfl: 3   lisinopril (ZESTRIL) 5 MG tablet, TAKE 1 TABLET BY MOUTH EVERY DAY, Disp: 90 tablet, Rfl: 2   Magnesium Oxide -Mg Supplement 200 MG TABS, Take 1 tablet (200 mg total) by mouth daily. (Patient taking differently: Take 250 mg by mouth daily.), Disp: 7 tablet, Rfl: 0   metFORMIN (GLUCOPHAGE) 500 MG tablet, Take 2 tablets (1,000 mg total) by mouth 2 (two) times daily with a meal. (Patient taking differently: Take 1,000 mg by mouth 2 (two) times daily with a meal. 2 pills morning, 1 pill pm), Disp: 360 tablet, Rfl: 1   metoprolol succinate (TOPROL-XL) 25 MG 24 hr tablet, Take 1 tablet (25 mg total) by mouth 2 (two) times daily. Patient must keep appointment for 08/01/22 for further refills. 1st attempt, Disp: 4 tablet, Rfl: 2   PRALUENT 150 MG/ML SOAJ, INJECT 1 PEN INTO THE SKIN EVERY 14 (FOURTEEN) DAYS., Disp: 2 mL, Rfl: 11   Study - [name] - olezarsen 50 mg, 80 mg or placebo SQ injection (PI-Hilty), Inject 80 mg into the skin every 30 (thirty) days. For Investigational Use Only. Injection subcutaneously in protocol approved injection sites (abdomen, thigh or outer area of upper arm) every 4 weeks. Medication is give in clinic. Please contact [facility] regarding any questions or concerns about this medication., Disp: , Rfl:

## 2022-08-08 ENCOUNTER — Encounter: Payer: Self-pay | Admitting: *Deleted

## 2022-08-21 ENCOUNTER — Other Ambulatory Visit: Payer: Self-pay | Admitting: Nurse Practitioner

## 2022-08-21 NOTE — Telephone Encounter (Signed)
  Encourage patient to contact the pharmacy for refills or they can request refills through [facility]  Did the patient contact the pharmacy:yes     LAST APPOINTMENT DATE:05/09/22  NEXT APPOINTMENT DATE:  MEDICATION:metoprolol succinate (TOPROL-XL) 25 MG 24 hr tablet  lisinopril (ZESTRIL) 5 MG tablet  Is the patient out of medication? yes  If not, how much is left?(360)(90)  Is this a 90 day supply: no  PHARMACY: [id number] [place], [place] [address] Phone:  [phone]  Fax:  [phone]      Let patient know to contact pharmacy at the end of the day to make sure medication is ready.  Please notify patient to allow 48-72 hours to process

## 2022-08-22 MED ORDER — METOPROLOL SUCCINATE ER 25 MG PO TB24: 25.0000 mg | ORAL_TABLET | Freq: Two times a day (BID) | ORAL | 3 refills | Status: DC

## 2022-08-22 MED ORDER — LISINOPRIL 5 MG PO TABS: 5.0000 mg | ORAL_TABLET | Freq: Every day | ORAL | 1 refills | Status: DC

## 2022-08-22 NOTE — Telephone Encounter (Signed)
Patient advised. Appt made for 08/29/22 Lisinopril refilled  Sending Metoprolol refill request to Dr End to review.

## 2022-08-26 ENCOUNTER — Other Ambulatory Visit: Payer: Self-pay | Admitting: Internal Medicine

## 2022-08-28 ENCOUNTER — Encounter: Payer: Self-pay | Admitting: *Deleted

## 2022-08-28 DIAGNOSIS — Z006 Encounter for examination for normal comparison and control in clinical research program: Secondary | ICD-10-CM

## 2022-08-28 NOTE — Research (Signed)
Message left for [name] to remind him of his appointment tomorrow at 23, and  gave him parking code.

## 2022-08-29 ENCOUNTER — Other Ambulatory Visit: Payer: Self-pay

## 2022-08-29 ENCOUNTER — Encounter: Payer: Medicare HMO | Admitting: *Deleted

## 2022-08-29 ENCOUNTER — Ambulatory Visit (INDEPENDENT_AMBULATORY_CARE_PROVIDER_SITE_OTHER): Payer: Medicare HMO | Admitting: Nurse Practitioner

## 2022-08-29 VITALS — BP 114/62 | HR 70 | Temp 97.2°F | Resp 14 | Ht 71.0 in | Wt 188.2 lb

## 2022-08-29 VITALS — BP 151/80 | HR 89 | Temp 98.2°F | Resp 16

## 2022-08-29 DIAGNOSIS — E119 Type 2 diabetes mellitus without complications: Secondary | ICD-10-CM | POA: Diagnosis not present

## 2022-08-29 DIAGNOSIS — Z006 Encounter for examination for normal comparison and control in clinical research program: Secondary | ICD-10-CM

## 2022-08-29 DIAGNOSIS — E1169 Type 2 diabetes mellitus with other specified complication: Secondary | ICD-10-CM

## 2022-08-29 DIAGNOSIS — E785 Hyperlipidemia, unspecified: Secondary | ICD-10-CM

## 2022-08-29 DIAGNOSIS — R748 Abnormal levels of other serum enzymes: Secondary | ICD-10-CM

## 2022-08-29 DIAGNOSIS — Z720 Tobacco use: Secondary | ICD-10-CM

## 2022-08-29 DIAGNOSIS — I10 Essential (primary) hypertension: Secondary | ICD-10-CM

## 2022-08-29 LAB — POCT GLYCOSYLATED HEMOGLOBIN (HGB A1C): Hemoglobin A1C: 7.5 % — AB (ref 4.0–5.6)

## 2022-08-29 MED ORDER — STUDY - ESSENCE - OLEZARSEN 50 MG, 80 MG OR PLACEBO SQ INJECTION (PI-HILTY)
80.0000 mg | INJECTION | SUBCUTANEOUS | Status: DC
  Administered 2022-08-29 – 2022-10-02 (×2): 80 mg via SUBCUTANEOUS
  Filled 2022-08-29: qty 0.8

## 2022-08-29 MED ORDER — DAPAGLIFLOZIN PROPANEDIOL 5 MG PO TABS: 5.0000 mg | ORAL_TABLET | Freq: Every day | ORAL | 1 refills | Status: DC

## 2022-08-29 NOTE — Assessment & Plan Note (Signed)
Patient currently on metformin 1500 mg daily.  Cannot tolerate 2000 mg daily.  Given patient's coronary artery disease and current smoking status will place on Farxiga 5 mg daily.  Likely titrate up to 10 mg next office visit.

## 2022-08-29 NOTE — Research (Signed)
     TREATMENT DAY 141 - STUDY WEEK 21    Subject Number: [id number]            Randomization Number: [id number]             Date: 29-Aug-2022     [x] Vital Signs Collected - Blood Pressure:151/80 - Heart Rate:89 - Respiratory Rate:16 - Temperature:98.2 - Oxygen Saturation:100%l  [x]   (abdominal pain only) since last visit  [x]  Assessment of ER Visits, Hospitalizations, and Inpatient Days  [x]  Adverse Events and Concomitant Medications  [x]  Diet, Lifestyle, and Alcohol Counseling   [x]  Study Drug: [place] Injection    Mr Polak is here for Week 21 Day 141 of the [name] study. He reports no abd pain, no changes in his medications, and no visit to the ED or urgent care since last seen.  VS taken at 0956 BP 151/80, HR 89, O2 sat 100% on RA, resp 16, and temp 98.2. Injection given in left arm at 1034 tol well. Scheduled next appointment for Nov 30 at 1000.    Current Outpatient Medications:    Alirocumab (PRALUENT) 150 MG/ML SOAJ, INJECT 1 PEN INTO THE SKIN EVERY 14 (FOURTEEN) DAYS., Disp: 2 mL, Rfl: 11   aspirin EC 81 MG tablet, Take 81 mg by mouth daily., Disp: , Rfl:    atorvastatin (LIPITOR) 40 MG tablet, TAKE 1 TABLET BY MOUTH EVERY DAY, Disp: 90 tablet, Rfl: 3   clopidogrel (PLAVIX) 75 MG tablet, TAKE 1 TABLET BY MOUTH EVERY DAY, Disp: 90 tablet, Rfl: 3   lisinopril (ZESTRIL) 5 MG tablet, Take 1 tablet (5 mg total) by mouth daily., Disp: 90 tablet, Rfl: 1   Magnesium Oxide -Mg Supplement 200 MG TABS, Take 1 tablet (200 mg total) by mouth daily. (Patient taking differently: Take 250 mg by mouth daily.), Disp: 7 tablet, Rfl: 0   metFORMIN (GLUCOPHAGE) 500 MG tablet, Take 2 tablets (1,000 mg total) by mouth 2 (two) times daily with a meal. (Patient taking differently: Take 1,000 mg by mouth 2 (two) times daily with a meal. 2 pills morning, 1 pill pm), Disp: 360 tablet, Rfl: 1   metoprolol succinate (TOPROL-XL) 25 MG 24 hr tablet, Take 1 tablet (25 mg total) by mouth 2 (two) times daily., Disp:  180 tablet, Rfl: 3   Study - [name] - olezarsen 50 mg, 80 mg or placebo SQ injection (PI-Hilty), Inject 80 mg into the skin every 30 (thirty) days. For Investigational Use Only. Injection subcutaneously in protocol approved injection sites (abdomen, thigh or outer area of upper arm) every 4 weeks. Medication is give in clinic. Please contact [facility] regarding any questions or concerns about this medication., Disp: , Rfl:   Current Facility-Administered Medications:    Study - [name] - olezarsen 50 mg, 80 mg or placebo SQ injection (PI-Hilty), 80 mg, Subcutaneous, Q28 days, Hilty, Nadean Corwin, MD, 80 mg at 08/29/22 1034

## 2022-08-29 NOTE — Patient Instructions (Signed)
Nice to see you today I will send a note to the lung cancer screening people to reach out to you Follow up with me in 4 months, sooner if you need me

## 2022-08-29 NOTE — Assessment & Plan Note (Signed)
Recheck today inclusive of GGT.  Pending lab results.

## 2022-08-29 NOTE — Assessment & Plan Note (Signed)
Blood pressure well controlled in office continue medication as prescribed.

## 2022-08-29 NOTE — Assessment & Plan Note (Signed)
Patient currently on Praluent and atorvastatin.

## 2022-08-29 NOTE — Assessment & Plan Note (Signed)
Current everyday smoker.  Did refer to LDCT but never was scheduled.  We will send a message

## 2022-08-29 NOTE — Progress Notes (Signed)
Established Patient Office Visit  Subjective   Patient ID: John Mathis, male    DOB: April 04, 1961  Age: 61 y.o. MRN: [id number]  Chief Complaint  Patient presents with   Diabetes    Follow up, taking Metformin 500 mg 2 in the morning and 1 in the evening, could not tolerate 4 tablets daily. Had GI issues.    Diabetes Pertinent negatives for hypoglycemia include no headaches. Pertinent negatives for diabetes include no chest pain.    DM2: Does not check his sugar at home. He was placed on metformin 500mg  2 tabs BID states he takes 2 in the morning and 1 in the evening. Cannot tolerate the full dose.  HTN      Review of Systems  Constitutional:  Negative for chills and fever.  Respiratory:  Negative for shortness of breath.   Cardiovascular:  Negative for chest pain.  Gastrointestinal:  Positive for diarrhea. Negative for abdominal pain, constipation, nausea and vomiting.       BM several time a day   Neurological:  Negative for tingling and headaches.      Objective:     BP 114/62   Pulse 70   Temp (!) 97.2 F (36.2 C) (Temporal)   Resp 14   Ht 5\' 11"  (1.803 m)   Wt 188 lb 4 oz (85.4 kg)   SpO2 96%   BMI 26.26 kg/m  BP Readings from Last 3 Encounters:  08/29/22 114/62  08/29/22 (!) 151/80  08/02/22 116/77   Wt Readings from Last 3 Encounters:  08/29/22 188 lb 4 oz (85.4 kg)  08/02/22 185 lb (83.9 kg)  08/01/22 185 lb (83.9 kg)      Physical Exam Vitals and nursing note reviewed.  Constitutional:      Appearance: Normal appearance.  Cardiovascular:     Rate and Rhythm: Normal rate and regular rhythm.     Heart sounds: Normal heart sounds.  Pulmonary:     Effort: Pulmonary effort is normal.     Breath sounds: Normal breath sounds.  Abdominal:     General: Bowel sounds are normal.  Musculoskeletal:     Right lower leg: No edema.     Left lower leg: No edema.       Feet:  Neurological:     Mental Status: He is alert.    Diabetic Foot Form  - Detailed   Diabetic Foot Exam - detailed Diabetic Foot exam was performed with the following findings: Yes 08/29/2022  4:19 PM  Is there swelling or and abnormal foot shape?: No Is there a claw toe deformity?: No Is there elevated skin temparature?: No Pulse Foot Exam completed.: Yes   Right posterior Tibialias: Present Left posterior Tibialias: Present   Right Dorsalis Pedis: Present Left Dorsalis Pedis: Present  Semmes-Weinstein Monofilament Test   Comments: All 10 sites bilateral sensation intact       Results for orders placed or performed in visit on 08/29/22  POCT glycosylated hemoglobin (Hb A1C)  Result Value Ref Range   Hemoglobin A1C 7.5 (A) 4.0 - 5.6 %   HbA1c POC (<> result, manual entry)     HbA1c, POC (prediabetic range)     HbA1c, POC (controlled diabetic range)        The ASCVD Risk score (Arnett DK, et al., 2019) failed to calculate for the following reasons:   The valid total cholesterol range is 130 to 320 mg/dL    Assessment & Plan:   Problem List Items Addressed This  Visit       Cardiovascular and Mediastinum   Essential hypertension    Blood pressure well controlled in office continue medication as prescribed.      Relevant Orders   CBC   Comprehensive metabolic panel     Endocrine   Type 2 diabetes mellitus without complication, without long-term current use of insulin ([place]) - Primary    Patient currently on metformin 1500 mg daily.  Cannot tolerate 2000 mg daily.  Given patient's coronary artery disease and current smoking status will place on Farxiga 5 mg daily.  Likely titrate up to 10 mg next office visit.      Relevant Medications   dapagliflozin propanediol (FARXIGA) 5 MG TABS tablet   Other Relevant Orders   POCT glycosylated hemoglobin (Hb A1C) (Completed)   Microalbumin/Creatinine Ratio, Urine   CBC   Comprehensive metabolic panel   Hyperlipidemia associated with type 2 diabetes mellitus ([place])    Patient currently on Praluent  and atorvastatin.      Relevant Medications   dapagliflozin propanediol (FARXIGA) 5 MG TABS tablet     Other   Tobacco abuse    Current everyday smoker.  Did refer to LDCT but never was scheduled.  We will send a message      Elevated liver enzymes    Recheck today inclusive of GGT.  Pending lab results.      Relevant Orders   Comprehensive metabolic panel   Gamma GT    Return in about 4 months (around 12/28/2022) for  DM recheck .    Romilda Garret, NP

## 2022-08-30 LAB — COMPREHENSIVE METABOLIC PANEL
ALT: 32 U/L (ref 0–53)
AST: 20 U/L (ref 0–37)
Albumin: 4.4 g/dL (ref 3.5–5.2)
Alkaline Phosphatase: 106 U/L (ref 39–117)
BUN: 12 mg/dL (ref 6–23)
CO2: 30 mEq/L (ref 19–32)
Calcium: 9.4 mg/dL (ref 8.4–10.5)
Chloride: 102 mEq/L (ref 96–112)
Creatinine, Ser: 0.62 mg/dL (ref 0.40–1.50)
GFR: 103.55 mL/min (ref >60.00)
Glucose, Bld: 191 mg/dL — ABNORMAL HIGH (ref 70–99)
Potassium: 3.5 mEq/L (ref 3.5–5.1)
Sodium: 140 mEq/L (ref 135–145)
Total Bilirubin: 0.6 mg/dL (ref 0.2–1.2)
Total Protein: 6.5 g/dL (ref 6.0–8.3)

## 2022-08-30 LAB — CBC
HCT: 47.8 % (ref 39.0–52.0)
Hemoglobin: 16.3 g/dL (ref 13.0–17.0)
MCHC: 34.2 g/dL (ref 30.0–36.0)
MCV: 90.2 fl (ref 78.0–100.0)
Platelets: 152 10*3/uL (ref 150.0–400.0)
RBC: 5.3 Mil/uL (ref 4.22–5.81)
RDW: 13.8 % (ref 11.5–15.5)
WBC: 5.9 10*3/uL (ref 4.0–10.5)

## 2022-08-30 LAB — MICROALBUMIN / CREATININE URINE RATIO
Creatinine,U: 97.3 mg/dL
Microalb Creat Ratio: 0.9 mg/g (ref 0.0–30.0)
Microalb, Ur: 0.9 mg/dL (ref 0.0–1.9)

## 2022-08-30 LAB — GAMMA GT: GGT: 26 U/L (ref 7–51)

## 2022-09-26 ENCOUNTER — Encounter: Payer: Self-pay | Admitting: *Deleted

## 2022-09-26 DIAGNOSIS — Z006 Encounter for examination for normal comparison and control in clinical research program: Secondary | ICD-10-CM

## 2022-09-26 NOTE — Research (Signed)
Message left to remind John Mathis of his appointment tomorrow with research at 1000, reminded to be NPO, and gave the parking code.

## 2022-10-01 ENCOUNTER — Encounter: Payer: Self-pay | Admitting: *Deleted

## 2022-10-01 DIAGNOSIS — Z006 Encounter for examination for normal comparison and control in clinical research program: Secondary | ICD-10-CM

## 2022-10-01 NOTE — Research (Signed)
Message left to remind [name] of his appointment tomorrow at 0900, reminded to be NPO and gave parking code.

## 2022-10-02 ENCOUNTER — Encounter: Payer: Medicare HMO | Admitting: *Deleted

## 2022-10-02 ENCOUNTER — Other Ambulatory Visit: Payer: Self-pay

## 2022-10-02 DIAGNOSIS — Z006 Encounter for examination for normal comparison and control in clinical research program: Secondary | ICD-10-CM

## 2022-10-02 MED ORDER — STUDY - ESSENCE - OLEZARSEN 50 MG, 80 MG OR PLACEBO SQ INJECTION (PI-HILTY)
80.0000 mg | INJECTION | SUBCUTANEOUS | Status: DC
  Filled 2022-10-02: qty 0.8

## 2022-10-02 NOTE — Progress Notes (Addendum)
Patient seen for Week 25 follow up.  He is cheerful and reports no complaints whatsoever.  Seems to tolerate monthly injections without difficulty.    VS BP  137/84      P 77       R 20 unlabored T98.3 No JVD or carotid bruits Lungs clear to A and P Cor - regular without murmur Abdomen -soft without masses noted Extremities -  No edema  Plan: continue follow and continuation in [name] trial.      Arturo Morton. Riley Kill, MD, [facility] Medical Director, [facility]

## 2022-10-02 NOTE — Research (Addendum)
Ancel Rasor Week 25 day 169 [name]  02-Oct-2022       Vicent Febles [place] 25 day 169 [name]  02-Oct-2022        Isair Inabinet Week 25 Day 169 02-Oct-2022           Chemistry: Glucose 168    mg/dL                   [] Clinically Significant  [x] Not Clinically Significant Uric Acid 3.3 mg/dL                      [] Clinically Significant  [x] Not Clinically Significant Hemoglobin A1c 6.9 %                 [] Clinically Significant  [x] Not Clinically Significant   Hematology: Hemoglobin 18.3  g/dL             [] Clinically Significant  [x] Not Clinically Significant   Urinalysis: Glucose >1000      mg/dL            [] Clinically Significant  [x] Not Clinically Significant Color light orange                        [] Clinically Significant  [x] Not Clinically Significant    Specific Gravity 1.036                  [] Clinically Significant  [x] Not Clinically Significan     Any further action needed to be taken per the PI?  No  , MD, [facility], FACP  [place]  [facility]  Medical Director of the Advanced Lipid Disorders &  Cardiovascular Risk Reduction Clinic Diplomate of the American Board of Clinical Lipidology Attending Cardiologist  Direct Dial: [phone]  Fax: [phone]  Website:  [email or web address]    TREATMENT DAY 169 - STUDY WEEK 25    Subject Number: [id number]              Randomization Number:[id number]            Date:02-Oct-2022      [x] Vital Signs Collected - Blood Pressure: 137/84 - Height: 5 ft 11 in  - Weight: 182.2 lbs - Heart Rate:77 - Respiratory Rate:20 - Temperature:98.3 - Oxygen Saturation: 99%  [x]  Physical Exam Completed by PI or SUB-I- Dr   [x]  Extended Urinalysis   [x]  Lab collection per protocol  [x]  (abdominal pain only) since last visit  [x]  Assessment of ER Visits, Hospitalizations, and Inpatient Days  [x]  Adverse Events and Concomitant Medications  [x]  Diet, Lifestyle, and Alcohol  Counseling   [x]  Study Drug: [place] Injection   Mr Jeancharles here for Week 25 of essence study. He reports no abd pain, no visits to the ED or Urgent care. VS taken at 0846, Blood work drawn at 0904, and Urine obtained at 0928. Injection given in left arm  at 0919 tol well. Next visit scheduled for Jan 2 at 1000.   Current Outpatient Medications:    Alirocumab (PRALUENT) 150 MG/ML SOAJ, INJECT 1 PEN INTO THE SKIN EVERY 14 (FOURTEEN) DAYS., Disp: 2 mL, Rfl: 11   aspirin EC 81 MG tablet, Take 81 mg by mouth daily., Disp: , Rfl:    atorvastatin (LIPITOR) 40 MG tablet, TAKE 1 TABLET BY MOUTH EVERY DAY, Disp: 90 tablet, Rfl: 3   clopidogrel (PLAVIX) 75 MG tablet, TAKE 1 TABLET BY MOUTH EVERY DAY, Disp: 90 tablet, Rfl: 3   dapagliflozin propanediol (FARXIGA) 5  MG TABS tablet, Take 1 tablet (5 mg total) by mouth daily before breakfast., Disp: 90 tablet, Rfl: 1   lisinopril (ZESTRIL) 5 MG tablet, Take 1 tablet (5 mg total) by mouth daily., Disp: 90 tablet, Rfl: 1   Magnesium Oxide -Mg Supplement 200 MG TABS, Take 1 tablet (200 mg total) by mouth daily. (Patient taking differently: Take 250 mg by mouth daily.), Disp: 7 tablet, Rfl: 0   metFORMIN (GLUCOPHAGE) 500 MG tablet, Take 2 tablets (1,000 mg total) by mouth 2 (two) times daily with a meal. (Patient taking differently: Take 1,000 mg by mouth 2 (two) times daily with a meal. 2 pills morning, 1 pill pm), Disp: 360 tablet, Rfl: 1   metoprolol succinate (TOPROL-XL) 25 MG 24 hr tablet, Take 1 tablet (25 mg total) by mouth 2 (two) times daily., Disp: 180 tablet, Rfl: 3   Study - [name] - olezarsen 50 mg, 80 mg or placebo SQ injection (PI-Hilty), Inject 80 mg into the skin every 30 (thirty) days. For Investigational Use Only. Injection subcutaneously in protocol approved injection sites (abdomen, thigh or outer area of upper arm) every 4 weeks. Medication is give in clinic. Please contact [facility] regarding any questions or  concerns about this medication., Disp: , Rfl:   Current Facility-Administered Medications:    Study - [name] - olezarsen 50 mg, 80 mg or placebo SQ injection (PI-Hilty), 80 mg, Subcutaneous, Q28 days, Hilty, Lisette Abu, MD

## 2022-10-05 ENCOUNTER — Ambulatory Visit (INDEPENDENT_AMBULATORY_CARE_PROVIDER_SITE_OTHER): Payer: Medicare HMO

## 2022-10-05 VITALS — Wt 182.0 lb

## 2022-10-05 DIAGNOSIS — Z0000 Encounter for general adult medical examination without abnormal findings: Secondary | ICD-10-CM | POA: Diagnosis not present

## 2022-10-05 NOTE — Progress Notes (Signed)
Virtual Visit via Telephone Note  I connected with  John Mathis on 10/05/22 at  9:00 AM EST by telephone and verified that I am speaking with the correct person using two identifiers.  Location: Patient: home Provider: [facility] Persons participating in the virtual visit: patient/Nurse Health Advisor   I discussed the limitations, risks, security and privacy concerns of performing an evaluation and management service by telephone and the availability of in person appointments. The patient expressed understanding and agreed to proceed.  Interactive audio and video telecommunications were attempted between this nurse and patient, however failed, due to patient having technical difficulties OR patient did not have access to video capability.  We continued and completed visit with audio only.  Some vital signs may be absent or patient reported.   Hal Hope, LPN  Subjective:   John Mathis is a 61 y.o. male who presents for Medicare Annual/Subsequent preventive examination.  Review of Systems     Cardiac Risk Factors include: advanced age (>65men, >9 women);dyslipidemia;hypertension;male gender;diabetes mellitus     Objective:    There were no vitals filed for this visit. There is no height or weight on file to calculate BMI.     10/05/2022    9:06 AM 10/02/2021   10:34 AM 05/08/2019    7:18 AM 05/19/2018   11:09 AM  Advanced Directives  Does Patient Have a Medical Advance Directive? No No No No  Would patient like information on creating a medical advance directive? No - Patient declined Yes (MAU/Ambulatory/Procedural Areas - Information given) No - Patient declined No - Patient declined    Current Medications (verified) Outpatient Encounter Medications as of 10/05/2022  Medication Sig   Alirocumab (PRALUENT) 150 MG/ML SOAJ INJECT 1 PEN INTO THE SKIN EVERY 14 (FOURTEEN) DAYS.   aspirin EC 81 MG tablet Take 81 mg by mouth daily.   atorvastatin (LIPITOR) 40 MG  tablet TAKE 1 TABLET BY MOUTH EVERY DAY   clopidogrel (PLAVIX) 75 MG tablet TAKE 1 TABLET BY MOUTH EVERY DAY   dapagliflozin propanediol (FARXIGA) 5 MG TABS tablet Take 1 tablet (5 mg total) by mouth daily before breakfast.   lisinopril (ZESTRIL) 5 MG tablet Take 1 tablet (5 mg total) by mouth daily.   Magnesium Oxide -Mg Supplement 200 MG TABS Take 1 tablet (200 mg total) by mouth daily. (Patient taking differently: Take 250 mg by mouth daily.)   metFORMIN (GLUCOPHAGE) 500 MG tablet Take 2 tablets (1,000 mg total) by mouth 2 (two) times daily with a meal. (Patient taking differently: Take 1,000 mg by mouth 2 (two) times daily with a meal. 2 pills morning, 1 pill pm)   metoprolol succinate (TOPROL-XL) 25 MG 24 hr tablet Take 1 tablet (25 mg total) by mouth 2 (two) times daily.   Study - [name] - olezarsen 50 mg, 80 mg or placebo SQ injection (PI-Hilty) Inject 80 mg into the skin every 30 (thirty) days. For Investigational Use Only. Injection subcutaneously in protocol approved injection sites (abdomen, thigh or outer area of upper arm) every 4 weeks. Medication is give in clinic. Please contact [facility] regarding any questions or concerns about this medication.   Facility-Administered Encounter Medications as of 10/05/2022  Medication   Study - [name] - olezarsen 50 mg, 80 mg or placebo SQ injection (PI-Hilty)    Allergies (verified) Other, Icosapent ethyl, and Ivp dye [iodinated contrast media]   History: Past Medical History:  Diagnosis Date   Arthritis    CAD (  coronary artery disease)    a. S/p multiple PCIs (report of 14 prior stents in [place]).   CHF (congestive heart failure) (HCC)    Depression    Diabetes mellitus without complication (HCC)    Diverticulitis    Familial hyperlipidemia    a. 02/2019 seen in lipid clinic-->Praluent started.   Hypertension    Ischemic cardiomyopathy    a. 01/18/2016 Echo [facility] [place]):  Nl LV size. EF 30-35% w/ inf HK. Trace AI, mild MR. Trace to mild TR. Nl RV size/fxn; b. 11/18/2017 Echo: Mildly dil LV w/ nl wall thickness. LVEF 50-55% w/ no rwma. Nl RV size/fxn.   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   Myocardial infarction (HCC)    X2   Past Surgical History:  Procedure Laterality Date   CARDIAC CATHETERIZATION  2012   x4 stents   CARDIAC CATHETERIZATION  2014   x6 stents   CARDIAC CATHETERIZATION  2014   x2 stents   HERNIA REPAIR     JOINT REPLACEMENT     partial then full   KNEE SURGERY Right    SHOULDER ARTHROSCOPY WITH OPEN ROTATOR CUFF REPAIR AND DISTAL CLAVICLE ACROMINECTOMY Left 05/22/2018   Procedure: SHOULDER ARTHROSCOPY WITH OPEN ROTATOR CUFF REPAIR, DISTAL CLAVICLE EXCISION, AND SUBACROMINAL DECOMPRESSION;  Surgeon: Juanell Fairly, MD;  Location: [facility] ORS;  Service: Orthopedics;  Laterality: Left;   SMALL INTESTINE SURGERY     TONSILLECTOMY     Family History  Problem Relation Age of Onset   Hyperlipidemia Mother    Other Father        parasite infection   Mental illness Maternal Grandmother    Social History   Socioeconomic History   Marital status: Widowed    Spouse name: Not on file   Number of children: Not on file   Years of education: Not on file   Highest education level: Not on file  Occupational History   Not on file  Tobacco Use   Smoking status: Every Day    Packs/day: 1.25    Years: 43.00    Total pack years: 53.75    Types: Cigarettes   Smokeless tobacco: Never  Vaping Use   Vaping Use: Not on file  Substance and Sexual Activity   Alcohol use: No   Drug use: No   Sexual activity: Not on file  Other Topics Concern   Not on file  Social History Narrative   Not on file   Social Determinants of Health   Financial Resource Strain: Low Risk  (10/05/2022)   Overall Financial Resource Strain (CARDIA)    Difficulty of Paying Living Expenses: Not hard at all  Food Insecurity: No Food Insecurity (10/05/2022)   Hunger Vital  Sign    Worried About Running Out of Food in the Last Year: Never true    Ran Out of Food in the Last Year: Never true  Transportation Needs: No Transportation Needs (10/05/2022)   PRAPARE - Administrator, Civil Service (Medical): No    Lack of Transportation (Non-Medical): No  Physical Activity: Sufficiently Active (10/05/2022)   Exercise Vital Sign    Days of Exercise per Week: 7 days    Minutes of Exercise per Session: 30 min  Stress: No Stress Concern Present (10/05/2022)   [employer] of Occupational Health - Occupational Stress Questionnaire    Feeling of Stress : Only a little  Social Connections: Socially Isolated (10/05/2022)   Social Connection and Isolation Panel [  NHANES]    Frequency of Communication with Friends and Family: More than three times a week    Frequency of Social Gatherings with Friends and Family: More than three times a week    Attends Religious Services: Never    Database administratorActive Member of Clubs or Organizations: No    Attends BankerClub or Organization Meetings: Never    Marital Status: Widowed    Tobacco Counseling Ready to quit: Not Answered Counseling given: Not Answered   Clinical Intake:  Pre-visit preparation completed: Yes  Pain : No/denies pain     Nutritional Risks: None Diabetes: Yes CBG done?: No Did pt. bring in CBG monitor from home?: No  How often do you need to have someone help you when you read instructions, pamphlets, or other written materials from your doctor or pharmacy?: 1 - Never  Diabetic?yes Nutrition Risk Assessment:  Has the patient had any N/V/D within the last 2 months?  No  Does the patient have any non-healing wounds?  No  Has the patient had any unintentional weight loss or weight gain?  No   Diabetes:  Is the patient diabetic?  Yes  If diabetic, was a CBG obtained today?  No  Did the patient bring in their glucometer from home?  No  How often do you monitor your CBG's? never.   Financial Strains and  Diabetes Management:  Are you having any financial strains with the device, your supplies or your medication? No .  Does the patient want to be seen by Chronic Care Management for management of their diabetes?  No  Would the patient like to be referred to a Nutritionist or for Diabetic Management?  No   Diabetic Exams:  Diabetic Eye Exam: Completed 05/23/21. Overdue for diabetic eye exam. Pt has been advised about the importance in completing this exam.   Diabetic Foot Exam: Completed 08/29/22. Pt has been advised about the importance in completing this exam.    Interpreter Needed?: No  Information entered by :: Kennedy BuckerLorrie Nicole Defino, LPN   Activities of Daily Living    10/05/2022    9:08 AM  In your present state of health, do you have any difficulty performing the following activities:  Hearing? 0  Vision? 0  Difficulty concentrating or making decisions? 0  Walking or climbing stairs? 1  Dressing or bathing? 0  Doing errands, shopping? 0  Preparing Food and eating ? N  Using the Toilet? N  In the past six months, have you accidently leaked urine? N  Do you have problems with loss of bowel control? N  Managing your Medications? N  Managing your Finances? N  Housekeeping or managing your Housekeeping? N    Patient Care Team: Eden Emmsable, James M, NP as PCP - General (Pain Medicine) End, Cristal Deerhristopher, MD as PCP - Cardiology (Cardiology)  Indicate any recent Medical Services you may have received from other than [facility] providers in the past year (date may be approximate).     Assessment:   This is a routine wellness examination for John Mathis.  Hearing/Vision screen Hearing Screening - Comments:: No aids Vision Screening - Comments:: Wears glasses- [facility]  Dietary issues and exercise activities discussed: Current Exercise Habits: Home exercise routine, Type of exercise: walking, Time (Minutes): 30, Frequency (Times/Week): 7, Weekly Exercise (Minutes/Week): 210, Intensity: Mild    Goals Addressed             This Visit's Progress    DIET - EAT MORE FRUITS AND VEGETABLES  Depression Screen    10/05/2022    9:04 AM 10/02/2021   10:37 AM 05/25/2021   10:04 AM 06/15/2019   10:26 AM 06/14/2017   10:42 AM  PHQ 2/9 Scores  PHQ - 2 Score 0 0 1 0 0  PHQ- 9 Score 0        Fall Risk    10/05/2022    9:08 AM 10/02/2021   10:36 AM 06/14/2017   10:42 AM  Fall Risk   Falls in the past year? 0 0 No  Number falls in past yr: 0 0   Injury with Fall? 0 0   Risk for fall due to : No Fall Risks No Fall Risks   Follow up Falls prevention discussed;Falls evaluation completed Falls prevention discussed     FALL RISK PREVENTION PERTAINING TO THE HOME:  Any stairs in or around the home? Yes  If so, are there any without handrails? No  Home free of loose throw rugs in walkways, pet beds, electrical cords, etc? Yes  Adequate lighting in your home to reduce risk of falls? Yes   ASSISTIVE DEVICES UTILIZED TO PREVENT FALLS:  Life alert? No  Use of a cane, walker or w/c? No  Grab bars in the bathroom? Yes  Shower chair or bench in shower? Yes  Elevated toilet seat or a handicapped toilet? No    Cognitive Function:        Immunizations Immunization History  Administered Date(s) Administered   PFIZER(Purple Top)SARS-COV-2 Vaccination 01/22/2020, 02/12/2020   Pneumococcal Polysaccharide-23 06/15/2019    TDAP status: Due, Education has been provided regarding the importance of this vaccine. Advised may receive this vaccine at local pharmacy or Health Dept. Aware to provide a copy of the vaccination record if obtained from local pharmacy or Health Dept. Verbalized acceptance and understanding.  Flu Vaccine status: Declined, Education has been provided regarding the importance of this vaccine but patient still declined. Advised may receive this vaccine at local pharmacy or Health Dept. Aware to provide a copy of the vaccination record if obtained from local  pharmacy or Health Dept. Verbalized acceptance and understanding.  Pneumococcal vaccine status: Due, Education has been provided regarding the importance of this vaccine. Advised may receive this vaccine at local pharmacy or Health Dept. Aware to provide a copy of the vaccination record if obtained from local pharmacy or Health Dept. Verbalized acceptance and understanding.  Covid-19 vaccine status: Completed vaccines  Qualifies for Shingles Vaccine? Yes   Zostavax completed No   Shingrix Completed?: No.    Education has been provided regarding the importance of this vaccine. Patient has been advised to call insurance company to determine out of pocket expense if they have not yet received this vaccine. Advised may also receive vaccine at local pharmacy or Health Dept. Verbalized acceptance and understanding.  Screening Tests Health Maintenance  Topic Date Due   DTaP/Tdap/Td (1 - Tdap) Never done   COLONOSCOPY (Pts 45-42yrs Insurance coverage will need to be confirmed)  Never done   Zoster Vaccines- Shingrix (1 of 2) Never done   Lung Cancer Screening  02/10/2022   OPHTHALMOLOGY EXAM  05/23/2022   COVID-19 Vaccine (3 - 2023-24 season) 06/29/2022   INFLUENZA VACCINE  01/27/2023 (Originally 05/29/2022)   HEMOGLOBIN A1C  02/27/2023   Diabetic kidney evaluation - GFR measurement  08/30/2023   Diabetic kidney evaluation - Urine ACR  08/30/2023   FOOT EXAM  08/30/2023   Medicare Annual Wellness (AWV)  10/06/2023   Hepatitis C Screening  Completed   HIV Screening  Completed   HPV VACCINES  Aged Out    Health Maintenance  Health Maintenance Due  Topic Date Due   DTaP/Tdap/Td (1 - Tdap) Never done   COLONOSCOPY (Pts 45-31yrs Insurance coverage will need to be confirmed)  Never done   Zoster Vaccines- Shingrix (1 of 2) Never done   Lung Cancer Screening  02/10/2022   OPHTHALMOLOGY EXAM  05/23/2022   COVID-19 Vaccine (3 - 2023-24 season) 06/29/2022    Colorectal cancer screening: Type of  screening: Cologuard. Completed 05/26/22. Repeat every 3 years  Lung Cancer Screening: (Low Dose CT Chest recommended if Age 63-80 years, 30 pack-year currently smoking OR have quit w/in 15years.) does qualify.   Lung Cancer Screening Referral: waiting for a call  Additional Screening:  Hepatitis C Screening: does qualify; Completed 06/15/19  Vision Screening: Recommended annual ophthalmology exams for early detection of glaucoma and other disorders of the eye. Is the patient up to date with their annual eye exam?  Yes  Who is the provider or what is the name of the office in which the patient attends annual eye exams? [facility] If pt is not established with a provider, would they like to be referred to a provider to establish care? No .   Dental Screening: Recommended annual dental exams for proper oral hygiene  Community Resource Referral / Chronic Care Management: CRR required this visit?  No   CCM required this visit?  No      Plan:     I have personally reviewed and noted the following in the patient's chart:   Medical and social history Use of alcohol, tobacco or illicit drugs  Current medications and supplements including opioid prescriptions. Patient is not currently taking opioid prescriptions. Functional ability and status Nutritional status Physical activity Advanced directives List of other physicians Hospitalizations, surgeries, and ER visits in previous 12 months Vitals Screenings to include cognitive, depression, and falls Referrals and appointments  In addition, I have reviewed and discussed with patient certain preventive protocols, quality metrics, and best practice recommendations. A written personalized care plan for preventive services as well as general preventive health recommendations were provided to patient.     Hal Hope, LPN   16/10/958   Nurse Notes: none

## 2022-10-05 NOTE — Patient Instructions (Signed)
John Mathis , Thank you for taking time to come for your Medicare Wellness Visit. I appreciate your ongoing commitment to your health goals. Please review the following plan we discussed and let me know if I can assist you in the future.   Screening recommendations/referrals: Colonoscopy: Cologuard 05/26/22 Recommended yearly ophthalmology/optometry visit for glaucoma screening and checkup Recommended yearly dental visit for hygiene and checkup  Vaccinations: Influenza vaccine: n/d Pneumococcal vaccine: 06/15/19 Tdap vaccine: n/d Shingles vaccine: n/d   Covid-19: 01/22/20, 02/12/20  Advanced directives: no  Conditions/risks identified: none  Next appointment: Follow up in one year for your annual wellness visit 10/07/23 @ 11:30 am by phone  Preventive Care 40-64 Years, Male Preventive care refers to lifestyle choices and visits with your health care provider that can promote health and wellness. What does preventive care include? A yearly physical exam. This is also called an annual well check. Dental exams once or twice a year. Routine eye exams. Ask your health care provider how often you should have your eyes checked. Personal lifestyle choices, including: Daily care of your teeth and gums. Regular physical activity. Eating a healthy diet. Avoiding tobacco and drug use. Limiting alcohol use. Practicing safe sex. Taking low-dose aspirin every day starting at age 15. What happens during an annual well check? The services and screenings done by your health care provider during your annual well check will depend on your age, overall health, lifestyle risk factors, and family history of disease. Counseling  Your health care provider may ask you questions about your: Alcohol use. Tobacco use. Drug use. Emotional well-being. Home and relationship well-being. Sexual activity. Eating habits. Work and work Astronomer. Screening  You may have the following tests or  measurements: Height, weight, and BMI. Blood pressure. Lipid and cholesterol levels. These may be checked every 5 years, or more frequently if you are over 49 years old. Skin check. Lung cancer screening. You may have this screening every year starting at age 49 if you have a 30-pack-year history of smoking and currently smoke or have quit within the past 15 years. Fecal occult blood test (FOBT) of the stool. You may have this test every year starting at age 63. Flexible sigmoidoscopy or colonoscopy. You may have a sigmoidoscopy every 5 years or a colonoscopy every 10 years starting at age 39. Prostate cancer screening. Recommendations will vary depending on your family history and other risks. Hepatitis C blood test. Hepatitis B blood test. Sexually transmitted disease (STD) testing. Diabetes screening. This is done by checking your blood sugar (glucose) after you have not eaten for a while (fasting). You may have this done every 1-3 years. Discuss your test results, treatment options, and if necessary, the need for more tests with your health care provider. Vaccines  Your health care provider may recommend certain vaccines, such as: Influenza vaccine. This is recommended every year. Tetanus, diphtheria, and acellular pertussis (Tdap, Td) vaccine. You may need a Td booster every 10 years. Zoster vaccine. You may need this after age 4. Pneumococcal 13-valent conjugate (PCV13) vaccine. You may need this if you have certain conditions and have not been vaccinated. Pneumococcal polysaccharide (PPSV23) vaccine. You may need one or two doses if you smoke cigarettes or if you have certain conditions. Talk to your health care provider about which screenings and vaccines you need and how often you need them. This information is not intended to replace advice given to you by your health care provider. Make sure you discuss any questions you  have with your health care provider. Document Released:  11/11/2015 Document Revised: 07/04/2016 Document Reviewed: 08/16/2015 Elsevier Interactive Patient Education  2017 [place] Prevention in the Home Falls can cause injuries. They can happen to people of all ages. There are many things you can do to make your home safe and to help prevent falls. What can I do on the outside of my home? Regularly fix the edges of walkways and driveways and fix any cracks. Remove anything that might make you trip as you walk through a door, such as a raised step or threshold. Trim any bushes or trees on the path to your home. Use bright outdoor lighting. Clear any walking paths of anything that might make someone trip, such as rocks or tools. Regularly check to see if handrails are loose or broken. Make sure that both sides of any steps have handrails. Any raised decks and porches should have guardrails on the edges. Have any leaves, snow, or ice cleared regularly. Use sand or salt on walking paths during winter. Clean up any spills in your garage right away. This includes oil or grease spills. What can I do in the bathroom? Use night lights. Install grab bars by the toilet and in the tub and shower. Do not use towel bars as grab bars. Use non-skid mats or decals in the tub or shower. If you need to sit down in the shower, use a plastic, non-slip stool. Keep the floor dry. Clean up any water that spills on the floor as soon as it happens. Remove soap buildup in the tub or shower regularly. Attach bath mats securely with double-sided non-slip rug tape. Do not have throw rugs and other things on the floor that can make you trip. What can I do in the bedroom? Use night lights. Make sure that you have a light by your bed that is easy to reach. Do not use any sheets or blankets that are too big for your bed. They should not hang down onto the floor. Have a firm chair that has side arms. You can use this for support while you get dressed. Do not have  throw rugs and other things on the floor that can make you trip. What can I do in the kitchen? Clean up any spills right away. Avoid walking on wet floors. Keep items that you use a lot in easy-to-reach places. If you need to reach something above you, use a strong step stool that has a grab bar. Keep electrical cords out of the way. Do not use floor polish or wax that makes floors slippery. If you must use wax, use non-skid floor wax. Do not have throw rugs and other things on the floor that can make you trip. What can I do with my stairs? Do not leave any items on the stairs. Make sure that there are handrails on both sides of the stairs and use them. Fix handrails that are broken or loose. Make sure that handrails are as long as the stairways. Check any carpeting to make sure that it is firmly attached to the stairs. Fix any carpet that is loose or worn. Avoid having throw rugs at the top or bottom of the stairs. If you do have throw rugs, attach them to the floor with carpet tape. Make sure that you have a light switch at the top of the stairs and the bottom of the stairs. If you do not have them, ask someone to add them for you.  What else can I do to help prevent falls? Wear shoes that: Do not have high heels. Have rubber bottoms. Are comfortable and fit you well. Are closed at the toe. Do not wear sandals. If you use a stepladder: Make sure that it is fully opened. Do not climb a closed stepladder. Make sure that both sides of the stepladder are locked into place. Ask someone to hold it for you, if possible. Clearly mark and make sure that you can see: Any grab bars or handrails. First and last steps. Where the edge of each step is. Use tools that help you move around (mobility aids) if they are needed. These include: Canes. Walkers. Scooters. Crutches. Turn on the lights when you go into a dark area. Replace any light bulbs as soon as they burn out. Set up your furniture so  you have a clear path. Avoid moving your furniture around. If any of your floors are uneven, fix them. If there are any pets around you, be aware of where they are. Review your medicines with your doctor. Some medicines can make you feel dizzy. This can increase your chance of falling. Ask your doctor what other things that you can do to help prevent falls. This information is not intended to replace advice given to you by your health care provider. Make sure you discuss any questions you have with your health care provider. Document Released: 08/11/2009 Document Revised: 03/22/2016 Document Reviewed: 11/19/2014 Elsevier Interactive Patient Education  2017 [employer].

## 2022-10-26 ENCOUNTER — Encounter: Payer: Self-pay | Admitting: *Deleted

## 2022-10-26 DIAGNOSIS — Z006 Encounter for examination for normal comparison and control in clinical research program: Secondary | ICD-10-CM

## 2022-10-26 NOTE — Research (Signed)
Message left to remind John Mathis of his appointment Tues Jan 2 at 1000, also gave him the parking code.

## 2022-10-30 ENCOUNTER — Other Ambulatory Visit: Payer: Self-pay

## 2022-10-30 ENCOUNTER — Encounter: Payer: Medicare HMO | Admitting: *Deleted

## 2022-10-30 DIAGNOSIS — Z006 Encounter for examination for normal comparison and control in clinical research program: Secondary | ICD-10-CM

## 2022-10-30 MED ORDER — STUDY - ESSENCE - OLEZARSEN 50 MG, 80 MG OR PLACEBO SQ INJECTION (PI-HILTY)
80.0000 mg | INJECTION | SUBCUTANEOUS | Status: DC
  Administered 2022-10-30: 80 mg via SUBCUTANEOUS
  Filled 2022-10-30: qty 0.8

## 2022-10-30 NOTE — Research (Signed)
     TREATMENT DAY 197 - STUDY WEEK 29    Subject Number: [id number]              Randomization Number: [id number]            [id number]      [x] Vital Signs Collected - Blood Pressure:136/81 - Heart Rate: 83 - Respiratory Rate:18 - Temperature:98.7 - Oxygen Saturation:100%  [x]   (abdominal pain only) since last visit  [x]  Assessment of ER Visits, Hospitalizations, and Inpatient Days  [x]  Adverse Events and Concomitant Medications  [x]  Diet, Lifestyle, and Alcohol Counseling   [x]  Study Drug: [place] Injection   John Mathis is her for Week 29 Day 197. Vs Taken at 1003. He reports no abd pain, no visits to the Ed or Urgent care. Injection given in left arm at 1055, tol well. Scheduled next visit for Jan 29 at 1000. No changes in his meds.    Current Outpatient Medications:    Alirocumab (PRALUENT) 150 MG/ML SOAJ, INJECT 1 PEN INTO THE SKIN EVERY 14 (FOURTEEN) DAYS., Disp: 2 mL, Rfl: 11   aspirin EC 81 MG tablet, Take 81 mg by mouth daily., Disp: , Rfl:    atorvastatin (LIPITOR) 40 MG tablet, TAKE 1 TABLET BY MOUTH EVERY DAY, Disp: 90 tablet, Rfl: 3   clopidogrel (PLAVIX) 75 MG tablet, TAKE 1 TABLET BY MOUTH EVERY DAY, Disp: 90 tablet, Rfl: 3   dapagliflozin propanediol (FARXIGA) 5 MG TABS tablet, Take 1 tablet (5 mg total) by mouth daily before breakfast., Disp: 90 tablet, Rfl: 1   lisinopril (ZESTRIL) 5 MG tablet, Take 1 tablet (5 mg total) by mouth daily., Disp: 90 tablet, Rfl: 1   Magnesium Oxide -Mg Supplement 200 MG TABS, Take 1 tablet (200 mg total) by mouth daily. (Patient taking differently: Take 250 mg by mouth daily.), Disp: 7 tablet, Rfl: 0   metFORMIN (GLUCOPHAGE) 500 MG tablet, Take 2 tablets (1,000 mg total) by mouth 2 (two) times daily with a meal. (Patient taking differently: Take 1,000 mg by mouth 2 (two) times daily with a meal. 2 pills morning, 1 pill pm), Disp: 360 tablet, Rfl: 1   metoprolol succinate (TOPROL-XL) 25 MG 24 hr tablet, Take 1 tablet (25 mg total) by mouth  2 (two) times daily., Disp: 180 tablet, Rfl: 3   Study - [name] - olezarsen 50 mg, 80 mg or placebo SQ injection (PI-Hilty), Inject 80 mg into the skin every 30 (thirty) days. For Investigational Use Only. Injection subcutaneously in protocol approved injection sites (abdomen, thigh or outer area of upper arm) every 4 weeks. Medication is give in clinic. Please contact [facility] regarding any questions or concerns about this medication., Disp: , Rfl:   Current Facility-Administered Medications:    Study - [name] - olezarsen 50 mg, 80 mg or placebo SQ injection (PI-Hilty), 80 mg, Subcutaneous, Q28 days, Hilty, Nadean Corwin, MD

## 2022-11-11 ENCOUNTER — Other Ambulatory Visit: Payer: Self-pay | Admitting: Internal Medicine

## 2022-11-18 ENCOUNTER — Other Ambulatory Visit: Payer: Self-pay | Admitting: Nurse Practitioner

## 2022-11-18 DIAGNOSIS — E119 Type 2 diabetes mellitus without complications: Secondary | ICD-10-CM

## 2022-11-19 NOTE — Telephone Encounter (Signed)
Patient has been scheduled

## 2022-11-23 ENCOUNTER — Encounter: Payer: Self-pay | Admitting: *Deleted

## 2022-11-23 DIAGNOSIS — Z006 Encounter for examination for normal comparison and control in clinical research program: Secondary | ICD-10-CM

## 2022-11-23 NOTE — Research (Signed)
Message left to remind [name] of his appointment Monday Jan 29 at 1000, gave him the parking code, and no need to be NPO.

## 2022-11-26 ENCOUNTER — Encounter: Payer: Medicare HMO | Admitting: *Deleted

## 2022-11-26 ENCOUNTER — Other Ambulatory Visit: Payer: Self-pay

## 2022-11-26 DIAGNOSIS — Z006 Encounter for examination for normal comparison and control in clinical research program: Secondary | ICD-10-CM

## 2022-11-26 MED ORDER — STUDY - ESSENCE - OLEZARSEN 50 MG, 80 MG OR PLACEBO SQ INJECTION (PI-HILTY)
80.0000 mg | INJECTION | SUBCUTANEOUS | Status: DC
  Filled 2022-11-26: qty 0.8

## 2022-11-26 NOTE — Research (Signed)
     TREATMENT DAY 225 - STUDY WEEK 33    Subject Number: [id number] Randomization Number: [id number]             Date: 26-Nov-2022     [x] Vital Signs Collected - Blood Pressure: 119/79 - Heart Rate:80 - Respiratory Rate:16 - Temperature:97.6 - Oxygen Saturation:100%   [x]   (abdominal pain only) since last visit  [x]  Assessment of ER Visits, Hospitalizations, and Inpatient Days  [x]  Adverse Events and Concomitant Medications  [x]  Diet, Lifestyle, and Alcohol Counseling   [x]  Study Drug: [place] Injection   John Mathis is here for Week 33 Day 225 of [name] research study. He reports no abd pain, no visits to the Ed or Urgent care since last seen. VS taken at 1003. Injection given at 1027 in left arm. Tol well. Next appointment scheduled for Feb 23 at 0900. No changes noted in  medications.  Current Outpatient Medications:    Alirocumab (PRALUENT) 150 MG/ML SOAJ, INJECT 1 PEN INTO THE SKIN EVERY 14 (FOURTEEN) DAYS., Disp: 2 mL, Rfl: 11   aspirin EC 81 MG tablet, Take 81 mg by mouth daily., Disp: , Rfl:    atorvastatin (LIPITOR) 40 MG tablet, TAKE 1 TABLET BY MOUTH EVERY DAY, Disp: 90 tablet, Rfl: 3   clopidogrel (PLAVIX) 75 MG tablet, TAKE 1 TABLET BY MOUTH EVERY DAY, Disp: 90 tablet, Rfl: 3   dapagliflozin propanediol (FARXIGA) 5 MG TABS tablet, Take 1 tablet (5 mg total) by mouth daily before breakfast., Disp: 90 tablet, Rfl: 1   lisinopril (ZESTRIL) 5 MG tablet, Take 1 tablet (5 mg total) by mouth daily., Disp: 90 tablet, Rfl: 1   Magnesium Oxide -Mg Supplement 200 MG TABS, Take 1 tablet (200 mg total) by mouth daily. (Patient taking differently: Take 250 mg by mouth daily.), Disp: 7 tablet, Rfl: 0   metFORMIN (GLUCOPHAGE) 500 MG tablet, TAKE 2 TABLETS (1,000 MG TOTAL) BY MOUTH 2 (TWO) TIMES DAILY WITH A MEAL., Disp: 360 tablet, Rfl: 1   metoprolol succinate (TOPROL-XL) 25 MG 24 hr tablet, Take 1 tablet (25 mg total) by mouth 2 (two) times daily., Disp: 180 tablet, Rfl: 3   Study - [name] -  olezarsen 50 mg, 80 mg or placebo SQ injection (PI-Hilty), Inject 80 mg into the skin every 30 (thirty) days. For Investigational Use Only. Injection subcutaneously in protocol approved injection sites (abdomen, thigh or outer area of upper arm) every 4 weeks. Medication is give in clinic. Please contact [facility] regarding any questions or concerns about this medication., Disp: , Rfl:   Current Facility-Administered Medications:    Study - [name] - olezarsen 50 mg, 80 mg or placebo SQ injection (PI-Hilty), 80 mg, Subcutaneous, Q28 days, Hilty, Nadean Corwin, MD

## 2022-12-20 ENCOUNTER — Encounter: Payer: Self-pay | Admitting: *Deleted

## 2022-12-20 DIAGNOSIS — Z006 Encounter for examination for normal comparison and control in clinical research program: Secondary | ICD-10-CM

## 2022-12-20 NOTE — Research (Signed)
Message left for John Mathis to remind him of his appointment tomorrow and to be NPO. Also gave him the parking code.

## 2022-12-21 ENCOUNTER — Encounter: Payer: Medicare HMO | Admitting: *Deleted

## 2022-12-21 ENCOUNTER — Other Ambulatory Visit: Payer: Self-pay

## 2022-12-21 DIAGNOSIS — Z006 Encounter for examination for normal comparison and control in clinical research program: Secondary | ICD-10-CM

## 2022-12-21 MED ORDER — STUDY - ESSENCE - OLEZARSEN 50 MG, 80 MG OR PLACEBO SQ INJECTION (PI-HILTY)
80.0000 mg | INJECTION | SUBCUTANEOUS | Status: DC
  Administered 2022-12-21 – 2023-01-18 (×2): 80 mg via SUBCUTANEOUS
  Filled 2022-12-21: qty 0.8

## 2022-12-21 NOTE — Research (Signed)
     TREATMENT DAY 37 - STUDY WEEK 253    Subject Number: [id number]             Randomization Number: [id number]      Date:21-Dec-2022     [x]$ Vital Signs Collected - Blood Pressure:125/81 - Heart Rate:74 - Respiratory Rate:16 - Temperature:98.0 - Oxygen Saturation:100%  [x]$  Extended Urinalysis   [x]$  Lab collection per protocol  [x]$  (abdominal pain only) since last visit  [x]$  Assessment of ER Visits, Hospitalizations, and Inpatient Days  [x]$  Adverse Events and Concomitant Medications  [x]$  Diet, Lifestyle, and Alcohol Counseling   [x]$  Study Drug: [place] Injection    John Mathis is here for Week 37 day 253 of [name] research. He reports no abd pain, no visits to the ED or Urgent care since last seen. No changes in his medications. VS taken at 0900, Blood drawn at 0908, urine obtained at 0930, and injection given at 0927 in left arm. Kit number [id number]. Tol well. Scheduled next visit for March 22 at 1000.   Current Outpatient Medications:    Alirocumab (PRALUENT) 150 MG/ML SOAJ, INJECT 1 PEN INTO THE SKIN EVERY 14 (FOURTEEN) DAYS., Disp: 2 mL, Rfl: 11   aspirin EC 81 MG tablet, Take 81 mg by mouth daily., Disp: , Rfl:    atorvastatin (LIPITOR) 40 MG tablet, TAKE 1 TABLET BY MOUTH EVERY DAY, Disp: 90 tablet, Rfl: 3   clopidogrel (PLAVIX) 75 MG tablet, TAKE 1 TABLET BY MOUTH EVERY DAY, Disp: 90 tablet, Rfl: 3   dapagliflozin propanediol (FARXIGA) 5 MG TABS tablet, Take 1 tablet (5 mg total) by mouth daily before breakfast., Disp: 90 tablet, Rfl: 1   lisinopril (ZESTRIL) 5 MG tablet, Take 1 tablet (5 mg total) by mouth daily., Disp: 90 tablet, Rfl: 1   Magnesium Oxide -Mg Supplement 200 MG TABS, Take 1 tablet (200 mg total) by mouth daily. (Patient taking differently: Take 250 mg by mouth daily.), Disp: 7 tablet, Rfl: 0   metFORMIN (GLUCOPHAGE) 500 MG tablet, TAKE 2 TABLETS (1,000 MG TOTAL) BY MOUTH 2 (TWO) TIMES DAILY WITH A MEAL., Disp: 360 tablet, Rfl: 1   metoprolol succinate (TOPROL-XL) 25  MG 24 hr tablet, Take 1 tablet (25 mg total) by mouth 2 (two) times daily., Disp: 180 tablet, Rfl: 3   Study - [name] - olezarsen 50 mg, 80 mg or placebo SQ injection (PI-Hilty), Inject 80 mg into the skin every 30 (thirty) days. For Investigational Use Only. Injection subcutaneously in protocol approved injection sites (abdomen, thigh or outer area of upper arm) every 4 weeks. Medication is give in clinic. Please contact [facility] regarding any questions or concerns about this medication., Disp: , Rfl:   Current Facility-Administered Medications:    Study - [name] - olezarsen 50 mg, 80 mg or placebo SQ injection (PI-Hilty), 80 mg, Subcutaneous, Q28 days, Hilty, Nadean Corwin, MD

## 2022-12-28 ENCOUNTER — Ambulatory Visit (INDEPENDENT_AMBULATORY_CARE_PROVIDER_SITE_OTHER): Payer: Medicare HMO | Admitting: Nurse Practitioner

## 2022-12-28 ENCOUNTER — Encounter: Payer: Self-pay | Admitting: Nurse Practitioner

## 2022-12-28 VITALS — BP 108/66 | HR 70 | Temp 98.2°F | Resp 16 | Ht 71.0 in | Wt 181.0 lb

## 2022-12-28 DIAGNOSIS — E119 Type 2 diabetes mellitus without complications: Secondary | ICD-10-CM | POA: Diagnosis not present

## 2022-12-28 DIAGNOSIS — I10 Essential (primary) hypertension: Secondary | ICD-10-CM | POA: Diagnosis not present

## 2022-12-28 LAB — POCT GLYCOSYLATED HEMOGLOBIN (HGB A1C): Hemoglobin A1C: 8.3 % — AB (ref 4.0–5.6)

## 2022-12-28 LAB — BASIC METABOLIC PANEL
BUN: 11 mg/dL (ref 6–23)
CO2: 30 mEq/L (ref 19–32)
Calcium: 9.9 mg/dL (ref 8.4–10.5)
Chloride: 102 mEq/L (ref 96–112)
Creatinine, Ser: 0.74 mg/dL (ref 0.40–1.50)
GFR: 97.94 mL/min (ref >60.00)
Glucose, Bld: 158 mg/dL — ABNORMAL HIGH (ref 70–99)
Potassium: 3.9 mEq/L (ref 3.5–5.1)
Sodium: 140 mEq/L (ref 135–145)

## 2022-12-28 MED ORDER — DAPAGLIFLOZIN PROPANEDIOL 5 MG PO TABS: 10.0000 mg | ORAL_TABLET | Freq: Every day | ORAL | 1 refills | Status: DC

## 2022-12-28 NOTE — Assessment & Plan Note (Signed)
Patient on lisinopril and metoprolol.  Blood pressures are well-controlled.

## 2022-12-28 NOTE — Patient Instructions (Addendum)
Nice to see you today Be mindful on your sugar intake, specifically in regards to juice and cookes/candies You can take 2 tablets of the faxiga '5mg'$  daily. When it is time for a refill I will change the prescription to a '10mg'$  tablet Follow up with me in 3 months, sooner if you need me

## 2022-12-28 NOTE — Progress Notes (Signed)
Established Patient Office Visit  Subjective   Patient ID: John Mathis, male    DOB: 1961-03-23  Age: 62 y.o. MRN: [id number]  Chief Complaint  Patient presents with   Diabetes      DM2: is on metformin 2 tabs in the am and 1 tab in the pm. Last A1C he was place on farxiga '5mg'$  last office visit Checking glucose at home: not chekcing  High sugar Low sugar: no low sugars at home Diet: states that he has been eating some subaway. States that he has been drinking pinapple juice over the past month Currently working on grape juice  States that he is doing 3 meals a day  States that he does like candies that will be daily States that he does have coffee with a mix of white sugar and alternative sugar.    HTN: lisinopril and metoprolol. Blood pressure under good control in office     Review of Systems  Constitutional:  Negative for chills and fever.  Respiratory:  Negative for shortness of breath.   Cardiovascular:  Negative for chest pain.  Gastrointestinal:  Negative for abdominal pain, diarrhea, nausea and vomiting.  Genitourinary:  Negative for dysuria and frequency.  Psychiatric/Behavioral:  Negative for hallucinations and suicidal ideas.       Objective:     BP 108/66   Pulse 70   Temp 98.2 F (36.8 C)   Resp 16   Ht '5\' 11"'$  (1.803 m)   Wt 181 lb (82.1 kg)   SpO2 99%   BMI 25.24 kg/m  BP Readings from Last 3 Encounters:  12/28/22 108/66  12/21/22 125/81  11/26/22 119/79   Wt Readings from Last 3 Encounters:  12/28/22 181 lb (82.1 kg)  10/05/22 182 lb (82.6 kg)  10/02/22 182 lb 3.2 oz (82.6 kg)      Physical Exam Vitals and nursing note reviewed.  Constitutional:      Appearance: Normal appearance.  Cardiovascular:     Rate and Rhythm: Normal rate and regular rhythm.     Heart sounds: Normal heart sounds.  Pulmonary:     Effort: Pulmonary effort is normal.     Breath sounds: Normal breath sounds.  Abdominal:     General: Bowel sounds are  normal. There is no distension.     Palpations: There is no mass.     Tenderness: There is no abdominal tenderness.     Hernia: No hernia is present.  Neurological:     Mental Status: He is alert.      Results for orders placed or performed in visit on 12/28/22  POCT glycosylated hemoglobin (Hb A1C)  Result Value Ref Range   Hemoglobin A1C 8.3 (A) 4.0 - 5.6 %   HbA1c POC (<> result, manual entry)     HbA1c, POC (prediabetic range)     HbA1c, POC (controlled diabetic range)        The ASCVD Risk score (Arnett DK, et al., 2019) failed to calculate for the following reasons:   The valid total cholesterol range is 130 to 320 mg/dL    Assessment & Plan:   Problem List Items Addressed This Visit       Cardiovascular and Mediastinum   Essential hypertension    Patient on lisinopril and metoprolol.  Blood pressures are well-controlled.        Endocrine   Type 2 diabetes mellitus without complication, without long-term current use of insulin ([place]) - Primary    Patient currently maintained on  1500 mg metformin daily.  Patient cannot tolerate 2000 mg per his report.  Patient also on Farxiga 5 mg A1c came back at 8.3%.  Will titrate Farxiga to 10 mg.  Patient can take 2 tablets of the 5 mg until runs out and I will send in a 10 mg prescription thereafter.  Follow-up in 3 months for DM recheck.  Did encourage patient to work on lifestyle modifications inclusive of diet      Relevant Medications   dapagliflozin propanediol (FARXIGA) 5 MG TABS tablet   Other Relevant Orders   POCT glycosylated hemoglobin (Hb A1C) (Completed)   Basic metabolic panel    Return in about 3 months (around 03/30/2023) for DM recheck.    Romilda Garret, NP

## 2022-12-28 NOTE — Assessment & Plan Note (Signed)
Patient currently maintained on 1500 mg metformin daily.  Patient cannot tolerate 2000 mg per his report.  Patient also on Farxiga 5 mg A1c came back at 8.3%.  Will titrate Farxiga to 10 mg.  Patient can take 2 tablets of the 5 mg until runs out and I will send in a 10 mg prescription thereafter.  Follow-up in 3 months for DM recheck.  Did encourage patient to work on lifestyle modifications inclusive of diet

## 2022-12-31 NOTE — Research (Signed)
Alvarez Paccione 08-Mar-2022 [name] Screening run in

## 2023-01-17 ENCOUNTER — Encounter: Payer: Self-pay | Admitting: *Deleted

## 2023-01-17 DIAGNOSIS — Z006 Encounter for examination for normal comparison and control in clinical research program: Secondary | ICD-10-CM

## 2023-01-17 NOTE — Research (Signed)
Message left to remind John Mathis of his appointment tomorrow at 1000. Also gave him the parking code.

## 2023-01-18 ENCOUNTER — Encounter: Payer: Medicare HMO | Admitting: *Deleted

## 2023-01-18 ENCOUNTER — Other Ambulatory Visit: Payer: Self-pay

## 2023-01-18 MED ORDER — STUDY - ESSENCE - OLEZARSEN 50 MG, 80 MG OR PLACEBO SQ INJECTION (PI-HILTY)
80.0000 mg | INJECTION | SUBCUTANEOUS | Status: DC
  Filled 2023-01-18: qty 0.8

## 2023-01-18 NOTE — Research (Addendum)
     TREATMENT DAY 281 - STUDY WEEK 41    Subject Number: [id number]            Randomization Number:[id number]           Date:18-Jan-2023      [x] Vital Signs Collected - Blood Pressure:132/74 - Heart Rate:74 - Respiratory Rate:18 - Temperature:97.9 - Oxygen Saturation:99%   [x]  (abdominal pain only) since last visit  [x]  Assessment of ER Visits, Hospitalizations, and Inpatient Days  [x]  Adverse Events and Concomitant Medications  [x]  Diet, Lifestyle, and Alcohol Counseling   [x]  Study Drug: [place] Injection   John Mathis is here for week 41 day 281 of essence research. He reports no abd pain, no visits to the ED or Urgent care. He does report a change in his Farxiga to 10 mg daily. Vs taken at 1005. Injection given in right arm at 1033. Kit number [id number]. Tol well. Scheduled next visit for April 19 at 1000.   Current Outpatient Medications:    Alirocumab (PRALUENT) 150 MG/ML SOAJ, INJECT 1 PEN INTO THE SKIN EVERY 14 (FOURTEEN) DAYS., Disp: 2 mL, Rfl: 11   aspirin EC 81 MG tablet, Take 81 mg by mouth daily., Disp: , Rfl:    atorvastatin (LIPITOR) 40 MG tablet, TAKE 1 TABLET BY MOUTH EVERY DAY, Disp: 90 tablet, Rfl: 3   clopidogrel (PLAVIX) 75 MG tablet, TAKE 1 TABLET BY MOUTH EVERY DAY, Disp: 90 tablet, Rfl: 3   dapagliflozin propanediol (FARXIGA) 5 MG TABS tablet, Take 2 tablets (10 mg total) by mouth daily before breakfast., Disp: 90 tablet, Rfl: 1   lisinopril (ZESTRIL) 5 MG tablet, Take 1 tablet (5 mg total) by mouth daily., Disp: 90 tablet, Rfl: 1   Magnesium Oxide -Mg Supplement 200 MG TABS, Take 1 tablet (200 mg total) by mouth daily. (Patient taking differently: Take 250 mg by mouth daily.), Disp: 7 tablet, Rfl: 0   metFORMIN (GLUCOPHAGE) 500 MG tablet, TAKE 2 TABLETS (1,000 MG TOTAL) BY MOUTH 2 (TWO) TIMES DAILY WITH A MEAL., Disp: 360 tablet, Rfl: 1   metoprolol succinate (TOPROL-XL) 25 MG 24 hr tablet, Take 1 tablet (25 mg total) by mouth 2 (two) times daily., Disp: 180 tablet,  Rfl: 3   Study - [name] - olezarsen 50 mg, 80 mg or placebo SQ injection (PI-Hilty), Inject 80 mg into the skin every 30 (thirty) days. For Investigational Use Only. Injection subcutaneously in protocol approved injection sites (abdomen, thigh or outer area of upper arm) every 4 weeks. Medication is give in clinic. Please contact [facility] regarding any questions or concerns about this medication., Disp: , Rfl:   Current Facility-Administered Medications:    Study - [name] - olezarsen 50 mg, 80 mg or placebo SQ injection (PI-Hilty), 80 mg, Subcutaneous, Q28 days, Hilty, Nadean Corwin, MD

## 2023-02-12 LAB — HM DIABETES EYE EXAM

## 2023-02-15 ENCOUNTER — Encounter: Payer: Medicare HMO | Admitting: *Deleted

## 2023-02-15 DIAGNOSIS — Z006 Encounter for examination for normal comparison and control in clinical research program: Secondary | ICD-10-CM

## 2023-02-15 MED ORDER — STUDY - ESSENCE - OLEZARSEN 50 MG, 80 MG OR PLACEBO SQ INJECTION (PI-HILTY)
80.0000 mg | INJECTION | SUBCUTANEOUS | Status: DC
  Administered 2023-02-15: 80 mg via SUBCUTANEOUS
  Filled 2023-02-15: qty 0.8

## 2023-02-15 NOTE — Research (Signed)
[name] visit week 45  No changes in meds No abdominal pain No ed or urgent care visits  Kit # [id number] Injection given: right arm @ 1016  Will see patient back in a month   Mercer Pod :) RN BSN  Clinical Research Nurse  Be strong and take heart, all you who hope in the [place]. ~ Psalm 31:24

## 2023-02-28 ENCOUNTER — Other Ambulatory Visit: Payer: Self-pay | Admitting: Nurse Practitioner

## 2023-02-28 DIAGNOSIS — E119 Type 2 diabetes mellitus without complications: Secondary | ICD-10-CM

## 2023-02-28 NOTE — Telephone Encounter (Signed)
Can we schedule the patient per my last office note. Also let patient know that when I send in the new script it will be for Farxiga 10mg  tablet and he will take just one a day

## 2023-02-28 NOTE — Telephone Encounter (Signed)
Patient has been scheduled

## 2023-03-14 DIAGNOSIS — Z006 Encounter for examination for normal comparison and control in clinical research program: Secondary | ICD-10-CM

## 2023-03-14 MED ORDER — STUDY - ESSENCE - OLEZARSEN 50 MG, 80 MG OR PLACEBO SQ INJECTION (PI-HILTY)
80.0000 mg | INJECTION | SUBCUTANEOUS | Status: DC
  Administered 2023-03-14: 80 mg via SUBCUTANEOUS
  Filled 2023-03-14: qty 0.8

## 2023-03-14 NOTE — Research (Signed)
[name] TREATMENT DAY 337 - STUDY WEEK 49       Subject Number: [id number]            Randomization Number:[id number]           Date:14-Mar-2023           [x] Vital Signs Collected - Blood Pressure:137/97 - Heart Rate:92 - Respiratory Rate:20 - Temperature:98.2 - Oxygen Saturation:97%     [x]  (abdominal pain only) since last visit   [x]  Assessment of ER Visits, Hospitalizations, and Inpatient Days   [x]  Adverse Events and Concomitant Medications   [x]  Diet, Lifestyle, and Alcohol Counseling    [x]  Study Drug: [place] Injection    [name] is here for week 49 day 337 of essence research. He reports no abd pain, no visits to the ED or Urgent care, no changes in his medications. Vs taken at 1010. Injection given in right arm at 1049. Kit number [id number]. Tol well. Next appointment scheduled for June 13th at 9:00.   Current Outpatient Medications:    Alirocumab (PRALUENT) 150 MG/ML SOAJ, INJECT 1 PEN INTO THE SKIN EVERY 14 (FOURTEEN) DAYS., Disp: 2 mL, Rfl: 11   aspirin EC 81 MG tablet, Take 81 mg by mouth daily., Disp: , Rfl:    atorvastatin (LIPITOR) 40 MG tablet, TAKE 1 TABLET BY MOUTH EVERY DAY, Disp: 90 tablet, Rfl: 3   clopidogrel (PLAVIX) 75 MG tablet, TAKE 1 TABLET BY MOUTH EVERY DAY, Disp: 90 tablet, Rfl: 3   dapagliflozin propanediol (FARXIGA) 10 MG TABS tablet, Take 1 tablet (10 mg total) by mouth daily before breakfast., Disp: 90 tablet, Rfl: 0   lisinopril (ZESTRIL) 5 MG tablet, Take 1 tablet (5 mg total) by mouth daily., Disp: 90 tablet, Rfl: 1   Magnesium Oxide -Mg Supplement 200 MG TABS, Take 1 tablet (200 mg total) by mouth daily. (Patient taking differently: Take 250 mg by mouth daily.), Disp: 7 tablet, Rfl: 0   metFORMIN (GLUCOPHAGE) 500 MG tablet, TAKE 2 TABLETS (1,000 MG TOTAL) BY MOUTH 2 (TWO) TIMES DAILY WITH A MEAL., Disp: 360 tablet, Rfl: 1   metoprolol succinate (TOPROL-XL) 25 MG 24 hr tablet, Take 1 tablet (25 mg total) by mouth 2 (two) times daily., Disp: 180 tablet, Rfl:  3   Study - [name] - olezarsen 50 mg, 80 mg or placebo SQ injection (PI-Hilty), Inject 80 mg into the skin every 30 (thirty) days. For Investigational Use Only. Injection subcutaneously in protocol approved injection sites (abdomen, thigh or outer area of upper arm) every 4 weeks. Medication is give in clinic. Please contact [facility] regarding any questions or concerns about this medication., Disp: , Rfl:   Current Facility-Administered Medications:    Study - [name] - olezarsen 50 mg, 80 mg or placebo SQ injection (PI-Hilty), 80 mg, Subcutaneous, Q28 days, Hilty, Lisette Abu, MD, 80 mg at 03/14/23 1049

## 2023-03-20 ENCOUNTER — Other Ambulatory Visit: Payer: Self-pay | Admitting: Nurse Practitioner

## 2023-04-01 ENCOUNTER — Encounter: Payer: Self-pay | Admitting: Nurse Practitioner

## 2023-04-01 ENCOUNTER — Ambulatory Visit (INDEPENDENT_AMBULATORY_CARE_PROVIDER_SITE_OTHER): Payer: Medicare HMO | Admitting: Nurse Practitioner

## 2023-04-01 VITALS — BP 120/64 | HR 76 | Temp 97.3°F | Resp 16 | Ht 71.0 in | Wt 172.4 lb

## 2023-04-01 DIAGNOSIS — Z7984 Long term (current) use of oral hypoglycemic drugs: Secondary | ICD-10-CM | POA: Diagnosis not present

## 2023-04-01 DIAGNOSIS — E119 Type 2 diabetes mellitus without complications: Secondary | ICD-10-CM

## 2023-04-01 LAB — POCT GLYCOSYLATED HEMOGLOBIN (HGB A1C): Hemoglobin A1C: 7.6 % — AB (ref 4.0–5.6)

## 2023-04-01 NOTE — Progress Notes (Signed)
Established Patient Office Visit  Subjective   Patient ID: John Mathis, male    DOB: Apr 02, 1961  Age: 62 y.o. MRN: [id number]  Chief Complaint  Patient presents with   Diabetes       DM2: patinet is on metformin 1500mg  daily along with farxiga 10mg  He does not check his glucose at home   States that he has noticed some weight loss. 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  3 meals a day. States he has cut down the portion size   Review of Systems  Constitutional:  Negative for chills and fever.  Respiratory:  Negative for shortness of breath.   Cardiovascular:  Negative for chest pain.  Neurological:  Negative for tingling and headaches.  Psychiatric/Behavioral:  Negative for hallucinations and suicidal ideas.       Objective:     BP 120/64   Pulse 76   Temp (!) 97.3 F (36.3 C)   Resp 16   Ht 5\' 11"  (1.803 m)   Wt 172 lb 6 oz (78.2 kg)   SpO2 98%   BMI 24.04 kg/m  BP Readings from Last 3 Encounters:  04/01/23 120/64  03/14/23 (!) 137/97  01/18/23 132/74   Wt Readings from Last 3 Encounters:  04/01/23 172 lb 6 oz (78.2 kg)  12/28/22 181 lb (82.1 kg)  10/05/22 182 lb (82.6 kg)      Physical Exam Vitals and nursing note reviewed.  Constitutional:      Appearance: Normal appearance.  Cardiovascular:     Rate and Rhythm: Normal rate and regular rhythm.     Pulses:          Dorsalis pedis pulses are 2+ on the right side and 2+ on the left side.       Posterior tibial pulses are 2+ on the right side and 2+ on the left side.     Heart sounds: Normal heart sounds.  Pulmonary:     Effort: Pulmonary effort is normal.     Breath sounds: Normal breath sounds.  Musculoskeletal:     Right lower leg: No edema.     Left lower leg: No edema.  Feet:     Right foot:     Skin integrity: Skin integrity normal.     Left foot:     Skin integrity: Skin integrity normal.  Skin:    General: Skin is warm.  Neurological:     Mental Status: He is alert.       Results for orders placed or performed in visit on 04/01/23  POCT glycosylated hemoglobin (Hb A1C)  Result Value Ref Range   Hemoglobin A1C 7.6 (A) 4.0 - 5.6 %   HbA1c POC (<> result, manual entry)     HbA1c, POC (prediabetic range)     HbA1c, POC (controlled diabetic range)        The ASCVD Risk score (Arnett DK, et al., 2019) failed to calculate for the following reasons:   The valid total cholesterol range is 130 to 320 mg/dL    Assessment & Plan:   Problem List Items Addressed This Visit       Endocrine   Type 2 diabetes mellitus without complication, without long-term current use of insulin ([facility]) - Primary    Patient currently maintained on metformin 1500 mg daily along with Farxiga 10 mg daily.  A1c has trended down.  Patient has lost some weight.  Continue work on healthy lifestyle medications will not change patient's antidiabetic medications currently  follow-up 3 months sooner if needed      Relevant Orders   POCT glycosylated hemoglobin (Hb A1C) (Completed)    Return in about 3 months (around 07/02/2023) for DM recheck.    Audria Nine, NP

## 2023-04-01 NOTE — Assessment & Plan Note (Signed)
Patient currently maintained on metformin 1500 mg daily along with Farxiga 10 mg daily.  A1c has trended down.  Patient has lost some weight.  Continue work on healthy lifestyle medications will not change patient's antidiabetic medications currently follow-up 3 months sooner if needed

## 2023-04-11 ENCOUNTER — Encounter: Payer: Medicare HMO | Admitting: *Deleted

## 2023-04-11 ENCOUNTER — Other Ambulatory Visit: Payer: Self-pay

## 2023-04-11 VITALS — BP 122/81 | HR 73 | Temp 97.9°F | Resp 18

## 2023-04-11 DIAGNOSIS — Z006 Encounter for examination for normal comparison and control in clinical research program: Secondary | ICD-10-CM

## 2023-04-11 NOTE — Progress Notes (Signed)
Patient seen today for Week 53 of [name] protocol.  He continues to do well, and voices no complaints.  He has a great sense of humor during the visit.  No chest pain.  Still smokes.    Alert, oriented gentleman in no acute distress Bp 122/81 P 73 R 18 T 97.9  Lungs clear Cor reg Abd soft without masses No edema.  R knee incision from prior TKR Ext without edema.  Pulses palpable. Neuro non focal   EKG  - NSR. WNL. Small inferior Qs.  No change from prior tracing 08/01/22 except no Qs wave in lead III.    Impression:  Week 53 [name] protocol                      Hypertriglyceridemia                      Ischemic Cardiomyopathy                      Multiple prior stents                      Tobacco use                        HTN                        DM   Plan :  Completion of protocol and continued follow up.   Arturo Morton. Riley Kill, MD [facility] Medical Director, [facility]

## 2023-04-11 NOTE — Research (Addendum)
John Mathis [name] Week 53 Day 365 11-April-2023                 Chemistry: Glucose  171   mg/dL                         [] Clinically Significant  [x] Not Clinically Significant Uric Acid 3.4 mg/dL                            [] Clinically Significant  [x] Not Clinically Significant. Hemoglobin A1c 7.2 %                       [] Clinically Significant  [x] Not Clinically Significant.   Hematology: Hemoglobin 18.6  g/dL                        [] Clinically Significant  [x] Not Clinically Significant Hematocrit 54 %                                 [] Clinically Significant  [x] Not Clinically Significant.    Urinalysis: Glucose  >1000  mg/dL                       [] Clinically Significant  [x] Not Clinically Significant Specific Gravity 1.040                          [] Clinically Significant  [x] Not Clinically Significant. Protein 10 mg/dL                                 [] Clinically Significant  [x] Not Clinically Significant. Mucus 1+                                             [] Clinically Significant  [x] Not Clinically Significant.     Any further action needed to be taken per the PI?  No  John Nose, MD, [facility], FACP  [place]  [facility]  Medical Director of the Advanced Lipid Disorders &  Cardiovascular Risk Reduction Clinic Diplomate of the American Board of Clinical Lipidology Attending Cardiologist  Direct Dial: [phone]  Fax: [phone]  Website:  [email or web address]      TREATMENT DAY 365 - STUDY WEEK 53    Subject Number:  [id number]             Randomization Number: [id number]            Date: 11-April-2023      [x] Vital Signs Collected - Blood Pressure:122/81 -WT: 172 lbs - Heart Rate:73 - Respiratory Rate:18 - Temperature:97.9 - Oxygen Saturation:98%  [x]  Physical Exam Completed by PI or SUB-I  [x]  12-lead ECG  [x]  Extended Urinalysis   [x]  Lab collection per protocol  [x]  (abdominal pain only) since last visit  [x]   Assessment of ER Visits, Hospitalizations, and Inpatient Days  [x]  Adverse Events and Concomitant Medications  [x]  Diet, Lifestyle, and Alcohol Counseling   [x]  Study Drug: [place] Injection    John Mathis is here for Week 53 day 365 of [name] research study. He reports no abd pain, no changes in his meds, and no visits  to the Ed or urgent care since last seen. Vs taken at 0915 Blood work drawn at 0940. Urine obtained at 0947. Informed him that the next visit will be a phone call. Between July 10-16. Voices understanding.      Current Outpatient Medications:    Alirocumab (PRALUENT) 150 MG/ML SOAJ, INJECT 1 PEN INTO THE SKIN EVERY 14 (FOURTEEN) DAYS., Disp: 2 mL, Rfl: 11   aspirin EC 81 MG tablet, Take 81 mg by mouth daily., Disp: , Rfl:    atorvastatin (LIPITOR) 40 MG tablet, TAKE 1 TABLET BY MOUTH EVERY DAY, Disp: 90 tablet, Rfl: 3   clopidogrel (PLAVIX) 75 MG tablet, TAKE 1 TABLET BY MOUTH EVERY DAY, Disp: 90 tablet, Rfl: 3   dapagliflozin propanediol (FARXIGA) 10 MG TABS tablet, Take 1 tablet (10 mg total) by mouth daily before breakfast., Disp: 90 tablet, Rfl: 0   lisinopril (ZESTRIL) 5 MG tablet, TAKE 1 TABLET (5 MG TOTAL) BY MOUTH DAILY., Disp: 90 tablet, Rfl: 1   Magnesium Oxide -Mg Supplement 200 MG TABS, Take 1 tablet (200 mg total) by mouth daily. (Patient taking differently: Take 250 mg by mouth daily.), Disp: 7 tablet, Rfl: 0   metFORMIN (GLUCOPHAGE) 500 MG tablet, TAKE 2 TABLETS (1,000 MG TOTAL) BY MOUTH 2 (TWO) TIMES DAILY WITH A MEAL., Disp: 360 tablet, Rfl: 1   metoprolol succinate (TOPROL-XL) 25 MG 24 hr tablet, Take 1 tablet (25 mg total) by mouth 2 (two) times daily., Disp: 180 tablet, Rfl: 3   Study - [name] - olezarsen 50 mg, 80 mg or placebo SQ injection (PI-Hilty), Inject 80 mg into the skin every 30 (thirty) days. For Investigational Use Only. Injection subcutaneously in protocol approved injection sites (abdomen, thigh or outer area of upper arm) every 4 weeks. Medication  is give in clinic. Please contact [facility] regarding any questions or concerns about this medication., Disp: , Rfl:   Current Facility-Administered Medications:    Study - [name] - olezarsen 50 mg, 80 mg or placebo SQ injection (PI-Hilty), 80 mg, Subcutaneous, Q28 days, Hilty, Lisette Abu, MD, 80 mg at 03/14/23 1049

## 2023-05-08 ENCOUNTER — Encounter: Payer: Self-pay | Admitting: *Deleted

## 2023-05-08 DIAGNOSIS — Z006 Encounter for examination for normal comparison and control in clinical research program: Secondary | ICD-10-CM

## 2023-05-08 NOTE — Research (Signed)
Follow up phone call for [name] research. John Mathis states he feels good. Reports no visits to the Ed or Urgent care, no changes in his meds, and no abd pain since last seen. Informed him I would be calling him again to check on him. Voices understanding.

## 2023-06-01 ENCOUNTER — Other Ambulatory Visit: Payer: Self-pay | Admitting: Nurse Practitioner

## 2023-06-01 DIAGNOSIS — E119 Type 2 diabetes mellitus without complications: Secondary | ICD-10-CM

## 2023-06-03 ENCOUNTER — Encounter: Payer: Self-pay | Admitting: *Deleted

## 2023-06-03 DIAGNOSIS — Z006 Encounter for examination for normal comparison and control in clinical research program: Secondary | ICD-10-CM

## 2023-06-03 NOTE — Research (Signed)
Text sent to John Mathis to check in for [name]. He states no abd pain, no changes in his meds, and no visits to the ed or urgent care. Next visit scheduled for Sept 9 at 1000.

## 2023-06-13 ENCOUNTER — Encounter (INDEPENDENT_AMBULATORY_CARE_PROVIDER_SITE_OTHER): Payer: Self-pay

## 2023-07-01 ENCOUNTER — Other Ambulatory Visit: Payer: Self-pay | Admitting: Nurse Practitioner

## 2023-07-01 DIAGNOSIS — E119 Type 2 diabetes mellitus without complications: Secondary | ICD-10-CM

## 2023-07-02 ENCOUNTER — Encounter: Payer: Self-pay | Admitting: Nurse Practitioner

## 2023-07-02 ENCOUNTER — Ambulatory Visit (INDEPENDENT_AMBULATORY_CARE_PROVIDER_SITE_OTHER): Payer: Medicare HMO | Admitting: Nurse Practitioner

## 2023-07-02 VITALS — BP 100/78 | HR 68 | Temp 97.6°F | Ht 71.0 in | Wt 172.4 lb

## 2023-07-02 DIAGNOSIS — E119 Type 2 diabetes mellitus without complications: Secondary | ICD-10-CM | POA: Diagnosis not present

## 2023-07-02 DIAGNOSIS — Z7984 Long term (current) use of oral hypoglycemic drugs: Secondary | ICD-10-CM | POA: Diagnosis not present

## 2023-07-02 DIAGNOSIS — Z7985 Long-term (current) use of injectable non-insulin antidiabetic drugs: Secondary | ICD-10-CM | POA: Diagnosis not present

## 2023-07-02 LAB — POCT GLYCOSYLATED HEMOGLOBIN (HGB A1C): Hemoglobin A1C: 7.6 % — AB (ref 4.0–5.6)

## 2023-07-02 MED ORDER — OZEMPIC (0.25 OR 0.5 MG/DOSE) 2 MG/3ML ~~LOC~~ SOPN: 0.2500 mg | PEN_INJECTOR | SUBCUTANEOUS | 0 refills | Status: DC

## 2023-07-02 NOTE — Progress Notes (Signed)
Established Patient Office Visit  Subjective   Patient ID: John Mathis, male    DOB: [birth year]  Age: 62 y.o. MRN: [id number]  Chief Complaint  Patient presents with   Diabetes    Pt states he feels the same as last visit. Good, normal.       DM2: patient is currently on metformin 1500mg  and farxiga.  States that he does not check glucose at home.  Patient does not have the supplies to check glucose.  He denies any signs and symptoms of hypoglycemia.  Patient states he still working on the lifestyle applications of last time with reducing portion size and he is no longer eating snacks throughout the day.  Patient is tolerating the medication well.  States we have tried him on 2000 mg of metformin but he cannot tolerate the GI side effects    Review of Systems  Constitutional:  Negative for chills and fever.  Respiratory:  Negative for shortness of breath.   Cardiovascular:  Negative for chest pain.  Gastrointestinal:  Negative for abdominal pain, constipation, diarrhea and vomiting.  Neurological:  Negative for headaches.  Psychiatric/Behavioral:  Negative for hallucinations and suicidal ideas.       Objective:     BP 100/78   Pulse 68   Temp 97.6 F (36.4 C) (Temporal)   Ht 5\' 11"  (1.803 m)   Wt 172 lb 6.4 oz (78.2 kg)   SpO2 97%   BMI 24.04 kg/m  BP Readings from Last 3 Encounters:  07/02/23 100/78  04/11/23 122/81  04/01/23 120/64   Wt Readings from Last 3 Encounters:  07/02/23 172 lb 6.4 oz (78.2 kg)  04/01/23 172 lb 6 oz (78.2 kg)  12/28/22 181 lb (82.1 kg)      Physical Exam Vitals and nursing note reviewed.  Constitutional:      Appearance: Normal appearance.  Cardiovascular:     Rate and Rhythm: Normal rate and regular rhythm.     Heart sounds: Normal heart sounds.  Pulmonary:     Effort: Pulmonary effort is normal.     Breath sounds: Normal breath sounds.  Abdominal:     General: Bowel sounds are normal.  Neurological:     Mental Status:  He is alert.      Results for orders placed or performed in visit on 07/02/23  POCT glycosylated hemoglobin (Hb A1C)  Result Value Ref Range   Hemoglobin A1C 7.6 (A) 4.0 - 5.6 %   HbA1c POC (<> result, manual entry)     HbA1c, POC (prediabetic range)     HbA1c, POC (controlled diabetic range)        The ASCVD Risk score (Arnett DK, et al., 2019) failed to calculate for the following reasons:   The valid total cholesterol range is 130 to 320 mg/dL    Assessment & Plan:   Problem List Items Addressed This Visit       Endocrine   Type 2 diabetes mellitus without complication, without long-term current use of insulin (HCC) - Primary    Continue metformin 1500 mg daily along with [place].  Will add on Ozempic 0.25 mg once a week for 4 weeks then titrate up to 0.5 mg once a week until you see me back in office.      Relevant Medications   Semaglutide,0.25 or 0.5MG /DOS, (OZEMPIC, 0.25 OR 0.5 MG/DOSE,) 2 MG/3ML SOPN   Other Relevant Orders   POCT glycosylated hemoglobin (Hb A1C) (Completed)    Return in about 3  months (around 10/01/2023) for CPE and Labs, DM recheck.    Audria Nine, NP

## 2023-07-02 NOTE — Patient Instructions (Signed)
Nice to see you today I have sent in the ozempic injection to the pharmcy Follow up with me in 3 month for CPE and DM follow up

## 2023-07-02 NOTE — Assessment & Plan Note (Signed)
Continue metformin 1500 mg daily along with [place].  Will add on Ozempic 0.25 mg once a week for 4 weeks then titrate up to 0.5 mg once a week until you see me back in office.

## 2023-07-08 ENCOUNTER — Encounter: Payer: Self-pay | Admitting: *Deleted

## 2023-07-08 DIAGNOSIS — Z006 Encounter for examination for normal comparison and control in clinical research program: Secondary | ICD-10-CM

## 2023-07-08 NOTE — Research (Addendum)
Mr [facility] sent a text saying he needs to reschedule.

## 2023-07-16 ENCOUNTER — Encounter: Payer: Medicare HMO | Admitting: *Deleted

## 2023-07-16 VITALS — BP 124/82 | HR 85 | Temp 98.3°F | Resp 20

## 2023-07-16 DIAGNOSIS — Z006 Encounter for examination for normal comparison and control in clinical research program: Secondary | ICD-10-CM

## 2023-07-16 NOTE — Progress Notes (Addendum)
Final visit for this nice gentleman for [name].  Continues to do well.  No major complaints.  He has started a new med (Ozempic), and seems to have some bloating, gas, abd complaints but continuing follow up with Matt Cable    BP 124/82  R 20  T 98.3 P85 No JVD Lungs minimal expiratory ronchii.  No rales Cor   normal S1 and S2 Abdomen soft without hepatosplenomegaly Ext  no edema.  Lots of tatoos UE.  Palpable pulses Neuro nonfocal   Imp    Final follow up for [name] for elevated triglycerides  Ischemic cardiomyopathy Multiple prior stents Tobacco Use HTN  DM  ECG -  NSR.  WNL.  No change from tracing of 08/01/2022.    Plan   Continue follow up with Primary care [facility])  Prior triglycerides 251 - hopefully improves with better DM control   John Mathis. Riley Kill, MD, [facility] Medical Director, [facility]

## 2023-07-16 NOTE — Research (Signed)
CS9 --day 61

## 2023-07-16 NOTE — Research (Addendum)
[name] Day 91 week 13 follow up  John Mathis 17-Sept-2024      ++     Chemistry: Uric Acid 3.7 mg/dL                          [] Clinically Significant  [x] Not Clinically Significant  Bilirubin (total) 1.31  mg/dL               [] Clinically Significant  [x] Not Clinically Significant  Bilirubin (direct) 0.26 mg/dL              [] Clinically Significant  [x] Not Clinically Significant Hemoglobin A1c  6.9                         [] Clinically Significant  [x] Not Clinically Significant  Hematology: Red Blood Cells 6.11                            [] Clinically Significant  [x] Not Clinically Significant Hemoglobin 19.3 g/dL                           [] Clinically Significant  [x] Not Clinically Significant Hematocrit 55                                        [] Clinically Significant  [x] Not Clinically Significant  Urinalysis: Glucose  >1000   mg/dL                       [] Clinically Significant  [x] Not Clinically Significant Specific Gravity 1.043                           [] Clinically Significant  [x] Not Clinically Significant Protein 20 mg/dL                                   [] Clinically Significant  [x] Not Clinically Significant Mucus 3+                                               [] Clinically Significant  [x] Not Clinically Significant       Any further action needed to be taken per the PI? No  [name], MD, [facility], FACP  [place]  [facility]  Medical Director of the Advanced Lipid Disorders &  Cardiovascular Risk Reduction Clinic Diplomate of the American Board of Clinical Lipidology Attending Cardiologist  Direct Dial: [phone]  Fax: [phone]  Website:  [email or web address]      Subject Number:[id number]               Randomization Number: [id number]            Date:17-Sept-2024      [x] Vital Signs Collected - Blood Pressure:124/82 - Heart Rate:85 - Respiratory Rate:20 - Temperature:98.3 - Oxygen Saturation:100%  [x]  Physical Exam Completed by  PI or SUB-I  [x]  12-lead ECG  []  Pregnancy Test (If applicable)  [x]  Extended Urinalysis   [x]  Lab collection per protocol  [x]   (abdominal pain only) since last visit  [x]  Assessment of ER Visits, Hospitalizations,  and Inpatient Days  [x]  Adverse Events and Concomitant Medications  [x]  Diet, Lifestyle, and Alcohol Counseling   []  Study Drug: [place] Injection   [name] is here for Week 13 Follow up for [name]. He reports no abd pain, no visits to the Ed or urgent care, and ozempic was added. VS taken at 0910. EKG complete at 0933. Dr Riley Kill did exam. Blood work drawn at [phone]. Urine obtained at 0941. Informed [name] that this will be our last visit. Voices understanding. Ozempic was added to [name] meds. I reached out to Dr Rennis Golden about this. He feels this was added for optional treatment for [name] DM, and this will not be considered an A/E.   Current Outpatient Medications:    Alirocumab (PRALUENT) 150 MG/ML SOAJ, INJECT 1 PEN INTO THE SKIN EVERY 14 (FOURTEEN) DAYS., Disp: 2 mL, Rfl: 11   aspirin EC 81 MG tablet, Take 81 mg by mouth daily., Disp: , Rfl:    atorvastatin (LIPITOR) 40 MG tablet, TAKE 1 TABLET BY MOUTH EVERY DAY, Disp: 90 tablet, Rfl: 3   clopidogrel (PLAVIX) 75 MG tablet, TAKE 1 TABLET BY MOUTH EVERY DAY, Disp: 90 tablet, Rfl: 3   FARXIGA 10 MG TABS tablet, TAKE 1 TABLET BY MOUTH DAILY BEFORE BREAKFAST., Disp: 30 tablet, Rfl: 2   lisinopril (ZESTRIL) 5 MG tablet, TAKE 1 TABLET (5 MG TOTAL) BY MOUTH DAILY., Disp: 90 tablet, Rfl: 1   Magnesium Oxide -Mg Supplement 200 MG TABS, Take 1 tablet (200 mg total) by mouth daily. (Patient taking differently: Take 250 mg by mouth daily.), Disp: 7 tablet, Rfl: 0   metFORMIN (GLUCOPHAGE) 500 MG tablet, TAKE 2 TABLETS (1,000 MG TOTAL) BY MOUTH 2 (TWO) TIMES DAILY WITH A MEAL., Disp: 360 tablet, Rfl: 1   metoprolol succinate (TOPROL-XL) 25 MG 24 hr tablet, Take 1 tablet (25 mg total) by mouth 2 (two) times daily., Disp: 180  tablet, Rfl: 3   Semaglutide,0.25 or 0.5MG /DOS, (OZEMPIC, 0.25 OR 0.5 MG/DOSE,) 2 MG/3ML SOPN, Inject 0.25 mg into the skin once a week., Disp: 3 mL, Rfl: 0   Study - [name] - olezarsen 50 mg, 80 mg or placebo SQ injection (PI-Hilty), Inject 80 mg into the skin every 30 (thirty) days. For Investigational Use Only. Injection subcutaneously in protocol approved injection sites (abdomen, thigh or outer area of upper arm) every 4 weeks. Medication is give in clinic. Please contact [facility] regarding any questions or concerns about this medication., Disp: , Rfl:   Current Facility-Administered Medications:    Study - [name] - olezarsen 50 mg, 80 mg or placebo SQ injection (PI-Hilty), 80 mg, Subcutaneous, Q28 days, Hilty, Lisette Abu, MD, 80 mg at 03/14/23 1049

## 2023-07-24 ENCOUNTER — Other Ambulatory Visit (HOSPITAL_COMMUNITY): Payer: Self-pay

## 2023-07-24 ENCOUNTER — Telehealth: Payer: Self-pay | Admitting: Pharmacy Technician

## 2023-07-24 NOTE — Telephone Encounter (Signed)
Pharmacy Patient Advocate Encounter  Received notification from [facility] that Prior Authorization for praluent has been APPROVED from 07/24/23 to 07/23/24   PA #/Case ID/Reference #: [id number]

## 2023-07-24 NOTE — Telephone Encounter (Signed)
Pharmacy Patient Advocate Encounter   Received notification from CoverMyMeds that prior authorization for praluent is required/requested.   Insurance verification completed.   The patient is insured through [facility] .   Per test claim: PA required; PA submitted to [facility] via CoverMyMeds Key/confirmation #/EOC [id number] Status is pending

## 2023-07-25 ENCOUNTER — Ambulatory Visit (INDEPENDENT_AMBULATORY_CARE_PROVIDER_SITE_OTHER): Payer: Medicare HMO | Admitting: Nurse Practitioner

## 2023-07-25 ENCOUNTER — Encounter: Payer: Self-pay | Admitting: Nurse Practitioner

## 2023-07-25 VITALS — BP 130/82 | HR 69 | Temp 97.9°F | Ht 71.0 in | Wt 171.6 lb

## 2023-07-25 DIAGNOSIS — M25561 Pain in right knee: Secondary | ICD-10-CM

## 2023-07-25 DIAGNOSIS — Z20822 Contact with and (suspected) exposure to covid-19: Secondary | ICD-10-CM

## 2023-07-25 LAB — POC COVID19 BINAXNOW: SARS Coronavirus 2 Ag: NEGATIVE

## 2023-07-25 NOTE — Assessment & Plan Note (Signed)
Went to his sister's wedding this past weekend and she called and tested positive for COVID-19.  COVID test in office came back negative

## 2023-07-25 NOTE — Assessment & Plan Note (Signed)
History of surgeries to right knee.  Likely over use injury.  Patient can use RICE therapy and Voltaren gel over-the-counter 4 times daily.  Follow-up if no improvement no indication for imaging today

## 2023-07-25 NOTE — Progress Notes (Signed)
Acute Office Visit  Subjective:     Patient ID: John Mathis, male    DOB: April 20, 1961, 62 y.o.   MRN: [id number]  Chief Complaint  Patient presents with   Knee Pain    Friday after wedding pt complains of pain starting. Throbbing/ Dull and achy pain. States he has been walking a lot more recently.    Covid Exposure    Pt states his sister tested pos for COVID after the wedding.      Patient is in today for multiple complaints with a history of cardiomyopahty, HTN, CAD, emphysema, DM2, Dyslipidemia, Tobacco use   Knee pain: states that he went to a wedding on Friday. States that he had long walks in the airport and hotel. States that eh wedding was Saturday. States hurting with rest. States unsure if it is worse with movement. States that he has used a knee sleeve and ice. States that the pain as a pressure behind the knee cap. States that it is a dull throb. Single spot of sharp pain that is radiaiting up and down and dulls out. States that he has used advil that did not help   Covid exposure: states that wedding was Saturday and flow home Sunday. States that she had covid test on Tuesday that was positive.   Review of Systems  Constitutional:  Negative for chills and fever.  HENT:  Positive for sore throat. Negative for ear discharge and ear pain.   Respiratory:  Negative for cough and shortness of breath.   Musculoskeletal:  Positive for joint pain.  Neurological:  Negative for headaches.        Objective:    BP 130/82   Pulse 69   Temp 97.9 F (36.6 C) (Temporal)   Ht 5\' 11"  (1.803 m)   Wt 171 lb 9.6 oz (77.8 kg)   SpO2 96%   BMI 23.93 kg/m  BP Readings from Last 3 Encounters:  07/25/23 130/82  07/16/23 124/82  07/02/23 100/78   Wt Readings from Last 3 Encounters:  07/25/23 171 lb 9.6 oz (77.8 kg)  07/02/23 172 lb 6.4 oz (78.2 kg)  04/01/23 172 lb 6 oz (78.2 kg)      Physical Exam Vitals and nursing note reviewed.  Constitutional:      Appearance:  Normal appearance.  Cardiovascular:     Rate and Rhythm: Normal rate and regular rhythm.     Heart sounds: Normal heart sounds.  Pulmonary:     Effort: Pulmonary effort is normal.     Breath sounds: Normal breath sounds.  Musculoskeletal:     Right knee: Crepitus present. No bony tenderness. Normal range of motion. Tenderness present over the lateral joint line and patellar tendon. Normal pulse.     Left knee: Normal range of motion. Normal pulse.  Neurological:     Mental Status: He is alert.     No results found for any visits on 07/25/23.      Assessment & Plan:   Problem List Items Addressed This Visit       Other   Close exposure to COVID-19 virus - Primary    Went to his sister's wedding this past weekend and she called and tested positive for COVID-19.  COVID test in office came back negative      Relevant Orders   POC COVID-19   Acute pain of right knee    History of surgeries to right knee.  Likely over use injury.  Patient can use RICE therapy  and Voltaren gel over-the-counter 4 times daily.  Follow-up if no improvement no indication for imaging today       No orders of the defined types were placed in this encounter.   Return if symptoms worsen or fail to improve, for as scheduled .  Audria Nine, NP

## 2023-07-25 NOTE — Patient Instructions (Signed)
Nice to see you today Covid test was negative Use voltaren gel up to 4 times a day Rest as much as you can. Use the knee sleeve while you are up and on it

## 2023-08-12 ENCOUNTER — Other Ambulatory Visit: Payer: Self-pay | Admitting: *Deleted

## 2023-08-15 ENCOUNTER — Other Ambulatory Visit (HOSPITAL_COMMUNITY): Payer: Self-pay

## 2023-08-26 ENCOUNTER — Other Ambulatory Visit: Payer: Self-pay | Admitting: Nurse Practitioner

## 2023-09-03 ENCOUNTER — Encounter (INDEPENDENT_AMBULATORY_CARE_PROVIDER_SITE_OTHER): Payer: Self-pay

## 2023-09-08 ENCOUNTER — Other Ambulatory Visit: Payer: Self-pay | Admitting: Internal Medicine

## 2023-09-08 ENCOUNTER — Other Ambulatory Visit: Payer: Self-pay | Admitting: Nurse Practitioner

## 2023-10-01 ENCOUNTER — Ambulatory Visit (INDEPENDENT_AMBULATORY_CARE_PROVIDER_SITE_OTHER): Payer: Medicare HMO | Admitting: Nurse Practitioner

## 2023-10-01 ENCOUNTER — Telehealth: Payer: Self-pay

## 2023-10-01 ENCOUNTER — Encounter: Payer: Self-pay | Admitting: Nurse Practitioner

## 2023-10-01 VITALS — BP 124/82 | HR 80 | Temp 97.7°F | Ht 71.0 in | Wt 174.4 lb

## 2023-10-01 DIAGNOSIS — K219 Gastro-esophageal reflux disease without esophagitis: Secondary | ICD-10-CM | POA: Diagnosis not present

## 2023-10-01 DIAGNOSIS — Z0000 Encounter for general adult medical examination without abnormal findings: Secondary | ICD-10-CM | POA: Diagnosis not present

## 2023-10-01 DIAGNOSIS — I10 Essential (primary) hypertension: Secondary | ICD-10-CM

## 2023-10-01 DIAGNOSIS — E785 Hyperlipidemia, unspecified: Secondary | ICD-10-CM

## 2023-10-01 DIAGNOSIS — E1169 Type 2 diabetes mellitus with other specified complication: Secondary | ICD-10-CM

## 2023-10-01 DIAGNOSIS — I2510 Atherosclerotic heart disease of native coronary artery without angina pectoris: Secondary | ICD-10-CM

## 2023-10-01 DIAGNOSIS — Z125 Encounter for screening for malignant neoplasm of prostate: Secondary | ICD-10-CM | POA: Diagnosis not present

## 2023-10-01 DIAGNOSIS — Z7985 Long-term (current) use of injectable non-insulin antidiabetic drugs: Secondary | ICD-10-CM

## 2023-10-01 DIAGNOSIS — Z7984 Long term (current) use of oral hypoglycemic drugs: Secondary | ICD-10-CM

## 2023-10-01 DIAGNOSIS — E119 Type 2 diabetes mellitus without complications: Secondary | ICD-10-CM

## 2023-10-01 DIAGNOSIS — Z720 Tobacco use: Secondary | ICD-10-CM

## 2023-10-01 DIAGNOSIS — Z122 Encounter for screening for malignant neoplasm of respiratory organs: Secondary | ICD-10-CM

## 2023-10-01 LAB — COMPREHENSIVE METABOLIC PANEL
ALT: 18 U/L (ref 0–53)
AST: 15 U/L (ref 0–37)
Albumin: 4.6 g/dL (ref 3.5–5.2)
Alkaline Phosphatase: 116 U/L (ref 39–117)
BUN: 13 mg/dL (ref 6–23)
CO2: 30 meq/L (ref 19–32)
Calcium: 9.8 mg/dL (ref 8.4–10.5)
Chloride: 102 meq/L (ref 96–112)
Creatinine, Ser: 0.76 mg/dL (ref 0.40–1.50)
GFR: 96.63 mL/min (ref >60.00)
Glucose, Bld: 132 mg/dL — ABNORMAL HIGH (ref 70–99)
Potassium: 4.1 meq/L (ref 3.5–5.1)
Sodium: 141 meq/L (ref 135–145)
Total Bilirubin: 1 mg/dL (ref 0.2–1.2)
Total Protein: 7.1 g/dL (ref 6.0–8.3)

## 2023-10-01 LAB — CBC
HCT: 54.3 % — ABNORMAL HIGH (ref 39.0–52.0)
Hemoglobin: 18.5 g/dL (ref 13.0–17.0)
MCHC: 34.1 g/dL (ref 30.0–36.0)
MCV: 92.6 fL (ref 78.0–100.0)
Platelets: 195 10*3/uL (ref 150.0–400.0)
RBC: 5.86 Mil/uL — ABNORMAL HIGH (ref 4.22–5.81)
RDW: 13.5 % (ref 11.5–15.5)
WBC: 6.7 10*3/uL (ref 4.0–10.5)

## 2023-10-01 LAB — PSA, MEDICARE: PSA: 0.79 ng/mL (ref 0.10–4.00)

## 2023-10-01 LAB — POCT GLYCOSYLATED HEMOGLOBIN (HGB A1C): Hemoglobin A1C: 6.5 % — AB (ref 4.0–5.6)

## 2023-10-01 LAB — URINALYSIS, MICROSCOPIC ONLY
RBC / HPF: NONE SEEN (ref 0–?)
WBC, UA: NONE SEEN (ref 0–?)

## 2023-10-01 LAB — LIPID PANEL
Cholesterol: 159 mg/dL (ref 0–200)
HDL: 30.2 mg/dL — ABNORMAL LOW (ref >39.00)
LDL Cholesterol: 88 mg/dL (ref 0–99)
NonHDL: 129.12
Total CHOL/HDL Ratio: 5
Triglycerides: 208 mg/dL — ABNORMAL HIGH (ref 0.0–149.0)
VLDL: 41.6 mg/dL — ABNORMAL HIGH (ref 0.0–40.0)

## 2023-10-01 LAB — MICROALBUMIN / CREATININE URINE RATIO
Creatinine,U: 164.5 mg/dL
Microalb Creat Ratio: 0.6 mg/g (ref 0.0–30.0)
Microalb, Ur: 1 mg/dL (ref 0.0–1.9)

## 2023-10-01 LAB — TSH: TSH: 1.82 u[IU]/mL (ref 0.35–5.50)

## 2023-10-01 MED ORDER — PANTOPRAZOLE SODIUM 20 MG PO TBEC: 20.0000 mg | DELAYED_RELEASE_TABLET | Freq: Every day | ORAL | 0 refills | Status: DC

## 2023-10-01 NOTE — Telephone Encounter (Signed)
LB lab called critical lab report hgb 18.5  sending note to Audria Nine NP, cable pool and will teams Janae.

## 2023-10-01 NOTE — Assessment & Plan Note (Signed)
Patient currently on atorvastatin and alirocumab and followed by lipid specialist continue medications as prescribed and follow-up with specialist as recommended.

## 2023-10-01 NOTE — Assessment & Plan Note (Signed)
Patient currently maintained on metoprolol 25 mg and lisinopril 5 mg.  Blood pressure within normal limits continue

## 2023-10-01 NOTE — Assessment & Plan Note (Signed)
History of the same.  Check urine microscopy to rule out microscopic hematuria

## 2023-10-01 NOTE — Assessment & Plan Note (Signed)
Discussed age-appropriate immunizations and screening exams.  Patient is up-to-date on all age-appropriate vaccinations he would like.  Patient get tetanus vaccine at local pharmacy patient refused flu vaccine today.  Patient is up-to-date on CRC screening.  Did do PSA for prostate cancer screening today.  Patient was given information at discharge about preventative healthcare maintenance with anticipatory guidance.

## 2023-10-01 NOTE — Assessment & Plan Note (Signed)
Secondary to GLP-1 receptor agonist.  Will treat patient with Protonix 20 mg daily for 30 days if needed increase dosage as patient is getting good benefit from GLP-1 medication

## 2023-10-01 NOTE — Assessment & Plan Note (Signed)
Patient currently maintained on Farxiga 10 mg, metformin 1000 mg twice daily and Ozempic 0.5 mg once a week.  Patient's A1c responded well at 6.5% today.  Continue medication as prescribed follow-up 6 months

## 2023-10-01 NOTE — Telephone Encounter (Signed)
Contacted pt to inform of critical lab.  Pt states that he is feeling fine.  He verbalized that he was not dizzy or lightheaded.  Advised to pt that if he starts to have any symptoms he needs to be seen.  Pt states that if he is having stroke like symptoms he will go to the ER.  Advised to pt that matt is out of office for the remainder of the day but will follow up tomorrow.  Pt verbalized understanding. No questions or concerns.

## 2023-10-01 NOTE — Progress Notes (Signed)
Established Patient Office Visit  Subjective   Patient ID: John Mathis, male    DOB: [birth year]  Age: 62 y.o. MRN: [id number]  Chief Complaint  Patient presents with   Annual Exam   Diabetes    Pt states that ozempic makes pt have acid reflux at night. States he has trouble sleeping and eating.        for complete physical and follow up of chronic conditions.   DM2: Patient is currently maintained on Farxiga 10, metformin 1000 mg twice daily and Ozempic 0.5 mg once a week. States that he is experiencing reflux at night that is waking him up and it is through the day. State that he has tried tums and Prilosec  HTN: Patient currently maintained on lisinopril 5 mg daily and metoprolol 25 mg daily  CAD: currently on atorvastatin and alirocumab.  Patient is followed by Dr. Zoila Shutter, cardiology.   Immunizations: -Tetanus: Completed in unsure, get local pharmacy -Influenza:  refused -Shingles: refused  -Pneumonia: Completed 2020  Diet: Fair diet. 3 meals a day. Some snacks. Coffe water and tea  Exercise: Walk his dog daily   Eye exam: Completes annually 01/2023 Dental exam: Completes semi-annually    Colonoscopy: Completed in 05/17/2022 cologuard  Lung Cancer Screening: 2022 ambulatory referral placed today  PSA: Due  Sleep: goes to bed around11-130 and gets up 7-730. Feels rested.       Review of Systems  Constitutional:  Negative for chills and fever.  Respiratory:  Negative for shortness of breath.   Cardiovascular:  Negative for chest pain and leg swelling.  Gastrointestinal:  Negative for abdominal pain, blood in stool, constipation, diarrhea, nausea and vomiting.       BM daily   Genitourinary:  Negative for dysuria and hematuria.  Neurological:  Negative for tingling and headaches.  Psychiatric/Behavioral:  Negative for hallucinations and suicidal ideas.         Objective:     BP 124/82   Pulse 80   Temp 97.7 F (36.5 C) (Oral)   Ht 5'  11" (1.803 m)   Wt 174 lb 6.4 oz (79.1 kg)   SpO2 90%   BMI 24.32 kg/m  BP Readings from Last 3 Encounters:  10/01/23 124/82  07/25/23 130/82  07/16/23 124/82   Wt Readings from Last 3 Encounters:  10/01/23 174 lb 6.4 oz (79.1 kg)  07/25/23 171 lb 9.6 oz (77.8 kg)  07/02/23 172 lb 6.4 oz (78.2 kg)   SpO2 Readings from Last 3 Encounters:  10/01/23 90%  07/25/23 96%  07/16/23 100%      Physical Exam Vitals and nursing note reviewed.  Constitutional:      Appearance: Normal appearance.  HENT:     Right Ear: Tympanic membrane, ear canal and external ear normal.     Left Ear: Tympanic membrane, ear canal and external ear normal.     Mouth/Throat:     Mouth: Mucous membranes are moist.     Pharynx: Oropharynx is clear.  Eyes:     Extraocular Movements: Extraocular movements intact.     Pupils: Pupils are equal, round, and reactive to light.  Cardiovascular:     Rate and Rhythm: Normal rate and regular rhythm.     Pulses: Normal pulses.     Heart sounds: Normal heart sounds.  Pulmonary:     Effort: Pulmonary effort is normal.     Breath sounds: Normal breath sounds.  Abdominal:     General: Bowel sounds are normal.  There is no distension.     Palpations: There is no mass.     Tenderness: There is no abdominal tenderness.     Hernia: No hernia is present.  Musculoskeletal:     Right lower leg: No edema.     Left lower leg: No edema.  Lymphadenopathy:     Cervical: No cervical adenopathy.  Skin:    General: Skin is warm.  Neurological:     General: No focal deficit present.     Mental Status: He is alert.     Deep Tendon Reflexes:     Reflex Scores:      Bicep reflexes are 2+ on the right side and 2+ on the left side.      Patellar reflexes are 2+ on the right side and 2+ on the left side.    Comments: Bilateral upper and lower extremity strength 5/5  Psychiatric:        Mood and Affect: Mood normal.        Behavior: Behavior normal.        Thought Content:  Thought content normal.        Judgment: Judgment normal.    Title   Diabetic Foot Exam - detailed Is there a history of foot ulcer?: No Is there a foot ulcer now?: No Is there swelling?: No Is there elevated skin temperature?: No Is there abnormal foot shape?: No Is there a claw toe deformity?: No Are the toenails long?: Yes Are the toenails thick?: No Are the toenails ingrown?: No Right Posterior Tibialis: Present Left posterior Tibialis: Present   Right Dorsalis Pedis: Present Left Dorsalis Pedis: Present     Semmes-Weinstein Monofilament Test "+" means "has sensation" and "-" means "no sensation"   R Site 6: Neg L Site 6: Neg     Image components are not supported.   Image components are not supported. Image components are not supported.  [place] Comments Sensation intact bilateral except for site 6  Callus present bilaterally       Results for orders placed or performed in visit on 10/01/23  POCT glycosylated hemoglobin (Hb A1C)  Result Value Ref Range   Hemoglobin A1C 6.5 (A) 4.0 - 5.6 %   HbA1c POC (<> result, manual entry)     HbA1c, POC (prediabetic range)     HbA1c, POC (controlled diabetic range)        The ASCVD Risk score (Arnett DK, et al., 2019) failed to calculate for the following reasons:   The valid total cholesterol range is 130 to 320 mg/dL    Assessment & Plan:   Problem List Items Addressed This Visit       Cardiovascular and Mediastinum   Essential hypertension    Patient currently maintained on metoprolol 25 mg and lisinopril 5 mg.  Blood pressure within normal limits continue      Coronary artery disease involving native coronary artery of native heart without angina pectoris    Patient currently maintained on atorvastatin and alirocumab and followed by cardiology.        Digestive   Gastroesophageal reflux disease    Secondary to GLP-1 receptor agonist.  Will treat patient with Protonix 20 mg daily for 30 days if needed  increase dosage as patient is getting good benefit from GLP-1 medication      Relevant Medications   pantoprazole (PROTONIX) 20 MG tablet     Endocrine   Type 2 diabetes mellitus without complication, without long-term current use of  insulin (HCC)    Patient currently maintained on Farxiga 10 mg, metformin 1000 mg twice daily and Ozempic 0.5 mg once a week.  Patient's A1c responded well at 6.5% today.  Continue medication as prescribed follow-up 6 months      Relevant Orders   POCT glycosylated hemoglobin (Hb A1C) (Completed)   CBC   Lipid panel   Microalbumin / creatinine urine ratio   Hyperlipidemia associated with type 2 diabetes mellitus (HCC)    Patient currently on atorvastatin and alirocumab and followed by lipid specialist continue medications as prescribed and follow-up with specialist as recommended.        Other   Tobacco abuse    History of the same.  Check urine microscopy to rule out microscopic hematuria      Relevant Orders   Urine Microscopic   Preventative health care - Primary    Discussed age-appropriate immunizations and screening exams.  Patient is up-to-date on all age-appropriate vaccinations he would like.  Patient get tetanus vaccine at local pharmacy patient refused flu vaccine today.  Patient is up-to-date on CRC screening.  Did do PSA for prostate cancer screening today.  Patient was given information at discharge about preventative healthcare maintenance with anticipatory guidance.      Relevant Orders   CBC   Comprehensive metabolic panel   TSH   Other Visit Diagnoses     Screening for lung cancer       Relevant Orders   Ambulatory Referral Lung Cancer Screening [facility]   Screening for prostate cancer       Relevant Orders   PSA, Medicare       Return in about 6 months (around 03/31/2024) for DM recheck.    Audria Nine, NP

## 2023-10-01 NOTE — Assessment & Plan Note (Signed)
Patient currently maintained on atorvastatin and alirocumab and followed by cardiology.

## 2023-10-02 ENCOUNTER — Telehealth: Payer: Self-pay | Admitting: Nurse Practitioner

## 2023-10-02 DIAGNOSIS — D582 Other hemoglobinopathies: Secondary | ICD-10-CM

## 2023-10-02 NOTE — Telephone Encounter (Signed)
-----   Message from [facility] T sent at 10/02/2023  2:57 PM EST ----- Seen by patient John Mathis on 10/02/2023 8:51 AM  Contacted pt regarding which location he prefers for blood doctor.  Pt would like the [place] location.  No further questions or concerns.

## 2023-10-02 NOTE — Telephone Encounter (Signed)
Referral placed.

## 2023-10-07 ENCOUNTER — Ambulatory Visit (INDEPENDENT_AMBULATORY_CARE_PROVIDER_SITE_OTHER): Payer: Medicare HMO

## 2023-10-07 VITALS — Ht 71.0 in | Wt 174.0 lb

## 2023-10-07 DIAGNOSIS — Z0000 Encounter for general adult medical examination without abnormal findings: Secondary | ICD-10-CM

## 2023-10-07 NOTE — Patient Instructions (Signed)
Mr. Bork , Thank you for taking time to come for your Medicare Wellness Visit. I appreciate your ongoing commitment to your health goals. Please review the following plan we discussed and let me know if I can assist you in the future.   Referrals/Orders/Follow-Ups/Clinician Recommendations: none  This is a list of the screening recommended for you and due dates:  Health Maintenance  Topic Date Due   DTaP/Tdap/Td vaccine (1 - Tdap) Never done   Colon Cancer Screening  Never done   Zoster (Shingles) Vaccine (1 of 2) Never done   Screening for Lung Cancer  02/10/2022   Flu Shot  Never done   Complete foot exam   08/30/2023   COVID-19 Vaccine (3 - 2023-24 season) 09/30/2024*   Eye exam for diabetics  02/12/2024   Hemoglobin A1C  03/31/2024   Yearly kidney function blood test for diabetes  09/30/2024   Yearly kidney health urinalysis for diabetes  09/30/2024   Medicare Annual Wellness Visit  10/06/2024   Hepatitis C Screening  Completed   HIV Screening  Completed   HPV Vaccine  Aged Out  *Topic was postponed. The date shown is not the original due date.    Advanced directives: (Declined) Advance directive discussed with you today. Even though you declined this today, please call our office should you change your mind, and we can give you the proper paperwork for you to fill out.  Next Medicare Annual Wellness Visit scheduled for next year: Yes 10/07/24 @ 3:00pm telephone

## 2023-10-07 NOTE — Progress Notes (Signed)
Subjective:   John Mathis is a 62 y.o. male who presents for Medicare Annual/Subsequent preventive examination.  Visit Complete: Virtual I connected with  John Mathis on 10/07/23 by a audio enabled telemedicine application and verified that I am speaking with the correct person using two identifiers.  Patient Location: Home  Provider Location: Office/Clinic  I discussed the limitations of evaluation and management by telemedicine. The patient expressed understanding and agreed to proceed.  Vital Signs: Because this visit was a virtual/telehealth visit, some criteria may be missing or patient reported. Any vitals not documented were not able to be obtained and vitals that have been documented are patient reported.  Patient Medicare AWV questionnaire was completed by the patient on (not done; I have confirmed that all information answered by patient is correct and no changes since this date.  Cardiac Risk Factors include: advanced age (>80men, >47 women);diabetes mellitus;dyslipidemia;hypertension;male gender;smoking/ tobacco exposure    Objective:    Today's Vitals   10/07/23 1534  Weight: 174 lb (78.9 kg)  Height: 5\' 11"  (1.803 m)   Body mass index is 24.27 kg/m.     10/07/2023    3:53 PM 10/05/2022    9:06 AM 10/02/2021   10:34 AM 05/08/2019    7:18 AM 05/19/2018   11:09 AM  Advanced Directives  Does Patient Have a Medical Advance Directive? No No No No No  Would patient like information on creating a medical advance directive?  No - Patient declined Yes (MAU/Ambulatory/Procedural Areas - Information given) No - Patient declined No - Patient declined    Current Medications (verified) Outpatient Encounter Medications as of 10/07/2023  Medication Sig   Alirocumab (PRALUENT) 150 MG/ML SOAJ INJECT 1 PEN INTO THE SKIN EVERY 14 (FOURTEEN) DAYS.   aspirin EC 81 MG tablet Take 81 mg by mouth daily.   atorvastatin (LIPITOR) 40 MG tablet TAKE 1 TABLET BY MOUTH EVERY DAY    clopidogrel (PLAVIX) 75 MG tablet TAKE 1 TABLET BY MOUTH EVERY DAY   FARXIGA 10 MG TABS tablet TAKE 1 TABLET BY MOUTH DAILY BEFORE BREAKFAST.   lisinopril (ZESTRIL) 5 MG tablet TAKE 1 TABLET (5 MG TOTAL) BY MOUTH DAILY.   Magnesium Oxide -Mg Supplement 200 MG TABS Take 1 tablet (200 mg total) by mouth daily. (Patient taking differently: Take 250 mg by mouth daily.)   metFORMIN (GLUCOPHAGE) 500 MG tablet TAKE 2 TABLETS (1,000 MG TOTAL) BY MOUTH 2 (TWO) TIMES DAILY WITH A MEAL.   metoprolol succinate (TOPROL-XL) 25 MG 24 hr tablet TAKE 1 TABLET BY MOUTH TWICE A DAY   pantoprazole (PROTONIX) 20 MG tablet Take 1 tablet (20 mg total) by mouth daily.   Semaglutide,0.25 or 0.5MG /DOS, (OZEMPIC, 0.25 OR 0.5 MG/DOSE,) 2 MG/3ML SOPN INJECT 0.5 MG INTO THE SKIN ONE TIME PER WEEK   No facility-administered encounter medications on file as of 10/07/2023.    Allergies (verified) Other, Icosapent ethyl (epa ethyl ester) (fish), and Ivp dye [iodinated contrast media]   History: Past Medical History:  Diagnosis Date   Arthritis    CAD (coronary artery disease)    a. S/p multiple PCIs (report of 14 prior stents in [place]).   CHF (congestive heart failure) (HCC)    Depression    Diabetes mellitus without complication (HCC)    Diverticulitis    Familial hyperlipidemia    a. 02/2019 seen in lipid clinic-->Praluent started.   Hypertension    Ischemic cardiomyopathy    a. 01/18/2016 Echo [facility] [place]): Nl  LV size. EF 30-35% w/ inf HK. Trace AI, mild MR. Trace to mild TR. Nl RV size/fxn; b. 11/18/2017 Echo: Mildly dil LV w/ nl wall thickness. LVEF 50-55% w/ no rwma. Nl RV size/fxn.   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   Myocardial infarction (HCC)    X2   Past Surgical History:  Procedure Laterality Date   CARDIAC CATHETERIZATION  2012   x4 stents   CARDIAC CATHETERIZATION  2014   x6 stents   CARDIAC CATHETERIZATION  2014   x2 stents   HERNIA REPAIR     JOINT REPLACEMENT      partial then full   KNEE SURGERY Right    SHOULDER ARTHROSCOPY WITH OPEN ROTATOR CUFF REPAIR AND DISTAL CLAVICLE ACROMINECTOMY Left 05/22/2018   Procedure: SHOULDER ARTHROSCOPY WITH OPEN ROTATOR CUFF REPAIR, DISTAL CLAVICLE EXCISION, AND SUBACROMINAL DECOMPRESSION;  Surgeon: Juanell Fairly, MD;  Location: [facility] ORS;  Service: Orthopedics;  Laterality: Left;   SMALL INTESTINE SURGERY     TONSILLECTOMY     Family History  Problem Relation Age of Onset   Hyperlipidemia Mother    Other Father        parasite infection   Mental illness Maternal Grandmother    Social History   Socioeconomic History   Marital status: Widowed    Spouse name: Not on file   Number of children: Not on file   Years of education: Not on file   Highest education level: Not on file  Occupational History   Not on file  Tobacco Use   Smoking status: Every Day    Current packs/day: 1.25    Average packs/day: 1.3 packs/day for 43.0 years (53.8 ttl pk-yrs)    Types: Cigarettes   Smokeless tobacco: Never  Vaping Use   Vaping status: Not on file  Substance and Sexual Activity   Alcohol use: No   Drug use: No   Sexual activity: Not on file  Other Topics Concern   Not on file  Social History Narrative   Not on file   Social Determinants of Health   Financial Resource Strain: Low Risk  (10/07/2023)   Overall Financial Resource Strain (CARDIA)    Difficulty of Paying Living Expenses: Not hard at all  Food Insecurity: No Food Insecurity (10/07/2023)   Hunger Vital Sign    Worried About Running Out of Food in the Last Year: Never true    Ran Out of Food in the Last Year: Never true  Transportation Needs: No Transportation Needs (10/07/2023)   PRAPARE - Administrator, Civil Service (Medical): No    Lack of Transportation (Non-Medical): No  Physical Activity: Sufficiently Active (10/07/2023)   Exercise Vital Sign    Days of Exercise per Week: 5 days    Minutes of Exercise per Session: 120 min   Stress: No Stress Concern Present (10/07/2023)   [employer] of Occupational Health - Occupational Stress Questionnaire    Feeling of Stress : Not at all  Social Connections: Socially Isolated (10/07/2023)   Social Connection and Isolation Panel [NHANES]    Frequency of Communication with Friends and Family: More than three times a week    Frequency of Social Gatherings with Friends and Family: More than three times a week    Attends Religious Services: Never    Database administrator or Organizations: No    Attends Banker Meetings: Never    Marital Status: Widowed    Tobacco Counseling Ready to quit:  Not Answered Counseling given: Not Answered   Clinical Intake:  Pre-visit preparation completed: No  Pain : No/denies pain   BMI - recorded: 24.27 Nutritional Status: BMI of 19-24  Normal Nutritional Risks: None Diabetes: Yes CBG done?: No CBG resulted in Enter/ Edit results?: No Did pt. bring in CBG monitor from home?: No  How often do you need to have someone help you when you read instructions, pamphlets, or other written materials from your doctor or pharmacy?: 1 - Never  Interpreter Needed?: No  Comments: lives alone Information entered by :: B.Kamalei Roeder,LPN   Activities of Daily Living    10/07/2023    3:56 PM  In your present state of health, do you have any difficulty performing the following activities:  Hearing? 0  Vision? 0  Difficulty concentrating or making decisions? 0  Walking or climbing stairs? 0  Dressing or bathing? 0  Doing errands, shopping? 0  Preparing Food and eating ? N  Using the Toilet? N  In the past six months, have you accidently leaked urine? N  Do you have problems with loss of bowel control? N  Managing your Medications? N  Managing your Finances? N  Housekeeping or managing your Housekeeping? N    Patient Care Team: Eden Emms, NP as PCP - General (Pain Medicine) End, Cristal Deer, MD as PCP - Cardiology  (Cardiology)  Indicate any recent Medical Services you may have received from other than [facility] providers in the past year (date may be approximate).     Assessment:   This is a routine wellness examination for [facility].  Hearing/Vision screen Hearing Screening - Comments:: Pt says his hearing is okay:tinnitus Vision Screening - Comments:: Pt says his vision w/glasses [facility] Dion Body   Goals Addressed             This Visit's Progress    COMPLETED: DIET - EAT MORE FRUITS AND VEGETABLES   On track    Patient Stated   On track    Would like to maintain current routine       Depression Screen    10/07/2023    3:51 PM 07/02/2023   11:21 AM 04/01/2023   10:15 AM 04/01/2023   10:10 AM 12/28/2022    9:45 AM 10/05/2022    9:04 AM 10/02/2021   10:37 AM  PHQ 2/9 Scores  PHQ - 2 Score 0 0 0 0 0 0 0  PHQ- 9 Score  0 1 1 2  0     Fall Risk    10/07/2023    3:48 PM 07/02/2023   11:21 AM 04/01/2023   10:15 AM 04/01/2023   10:09 AM 12/28/2022    9:45 AM  Fall Risk   Falls in the past year? 0 0 0 0 0  Number falls in past yr: 0 0 0 0 0  Injury with Fall? 0 0 0 0 0  Risk for fall due to : No Fall Risks No Fall Risks No Fall Risks No Fall Risks No Fall Risks  Follow up Falls prevention discussed;Education provided Falls evaluation completed Falls evaluation completed Falls evaluation completed Falls evaluation completed    MEDICARE RISK AT HOME: Medicare Risk at Home Any stairs in or around the home?: Yes If so, are there any without handrails?: Yes Home free of loose throw rugs in walkways, pet beds, electrical cords, etc?: Yes Adequate lighting in your home to reduce risk of falls?: Yes Life alert?: No Use of a cane, walker or w/c?:  No Grab bars in the bathroom?: Yes Shower chair or bench in shower?: Yes Elevated toilet seat or a handicapped toilet?: No  TIMED UP AND GO:  Was the test performed?  No    Cognitive Function:        10/07/2023    3:56 PM  6CIT Screen   What Year? 0 points  What month? 0 points  What time? 0 points  Count back from 20 0 points  Months in reverse 0 points  Repeat phrase 0 points  Total Score 0 points   Immunizations Immunization History  Administered Date(s) Administered   PFIZER(Purple Top)SARS-COV-2 Vaccination 01/22/2020, 02/12/2020   Pneumococcal Polysaccharide-23 06/15/2019    TDAP status: Up to date  Flu Vaccine status: Declined, Education has been provided regarding the importance of this vaccine but patient still declined. Advised may receive this vaccine at local pharmacy or Health Dept. Aware to provide a copy of the vaccination record if obtained from local pharmacy or Health Dept. Verbalized acceptance and understanding.  Pneumococcal vaccine status: Up to date  Covid-19 vaccine status: Completed vaccines  Qualifies for Shingles Vaccine? Yes   Zostavax completed No   Shingrix Completed?: No.    Education has been provided regarding the importance of this vaccine. Patient has been advised to call insurance company to determine out of pocket expense if they have not yet received this vaccine. Advised may also receive vaccine at local pharmacy or Health Dept. Verbalized acceptance and understanding.  Screening Tests Health Maintenance  Topic Date Due   Lung Cancer Screening  02/10/2022   Zoster Vaccines- Shingrix (1 of 2) 01/05/2024 (Originally 09/09/2011)   INFLUENZA VACCINE  01/27/2024 (Originally 05/30/2023)   COVID-19 Vaccine (3 - 2023-24 season) 09/30/2024 (Originally 06/30/2023)   DTaP/Tdap/Td (1 - Tdap) 10/06/2024 (Originally 09/08/1980)   Colonoscopy  10/06/2024 (Originally 09/08/2006)   OPHTHALMOLOGY EXAM  02/12/2024   HEMOGLOBIN A1C  03/31/2024   Diabetic kidney evaluation - eGFR measurement  09/30/2024   Diabetic kidney evaluation - Urine ACR  09/30/2024   FOOT EXAM  09/30/2024   Medicare Annual Wellness (AWV)  10/06/2024   Hepatitis C Screening  Completed   HIV Screening  Completed   HPV  VACCINES  Aged Out    Health Maintenance  Health Maintenance Due  Topic Date Due   Lung Cancer Screening  02/10/2022    Colorectal cancer screening: Type of screening: Cologuard. Completed 2023. Repeat every 3 years  Lung Cancer Screening: (Low Dose CT Chest recommended if Age 73-80 years, 20 pack-year currently smoking OR have quit w/in 15years.) does not qualify.   Lung Cancer Screening Referral: no  Additional Screening:  Hepatitis C Screening: does not qualify; Completed 06/15/2019  Vision Screening: Recommended annual ophthalmology exams for early detection of glaucoma and other disorders of the eye. Is the patient up to date with their annual eye exam?  No  Who is the provider or what is the name of the office in which the patient attends annual eye exams? Dr Dion Body If pt is not established with a provider, would they like to be referred to a provider to establish care? No .   Dental Screening: Recommended annual dental exams for proper oral hygiene  Diabetic Foot Exam: Diabetic Foot Exam: Completed 08/30/23  Community Resource Referral / Chronic Care Management: CRR required this visit?  No   CCM required this visit?  No     Plan:     I have personally reviewed and noted the following in the  patient's chart:   Medical and social history Use of alcohol, tobacco or illicit drugs  Current medications and supplements including opioid prescriptions. Patient is not currently taking opioid prescriptions. Functional ability and status Nutritional status Physical activity Advanced directives List of other physicians Hospitalizations, surgeries, and ER visits in previous 12 months Vitals Screenings to include cognitive, depression, and falls Referrals and appointments  In addition, I have reviewed and discussed with patient certain preventive protocols, quality metrics, and best practice recommendations. A written personalized care plan for preventive services as  well as general preventive health recommendations were provided to patient.     Sue Lush, LPN   [id number]   After Visit Summary: (MyChart) Due to this being a telephonic visit, the after visit summary with patients personalized plan was offered to patient via MyChart   Nurse Notes: The patient states he is doing alright but continues to have lots of gas, stomach pain and belching from Ozempic. He relays he is awakened with it at night and symptoms are undesirable at this time. Pt to notify PCP if persists and/or worsens.

## 2023-10-09 ENCOUNTER — Inpatient Hospital Stay: Payer: Medicare HMO | Attending: Oncology | Admitting: Oncology

## 2023-10-09 ENCOUNTER — Inpatient Hospital Stay: Payer: Medicare HMO

## 2023-10-09 ENCOUNTER — Encounter: Payer: Self-pay | Admitting: Oncology

## 2023-10-09 VITALS — BP 134/98 | HR 99 | Temp 97.1°F | Resp 16 | Ht 71.0 in | Wt 174.4 lb

## 2023-10-09 DIAGNOSIS — D751 Secondary polycythemia: Secondary | ICD-10-CM | POA: Diagnosis not present

## 2023-10-09 DIAGNOSIS — F17210 Nicotine dependence, cigarettes, uncomplicated: Secondary | ICD-10-CM | POA: Insufficient documentation

## 2023-10-09 DIAGNOSIS — R109 Unspecified abdominal pain: Secondary | ICD-10-CM | POA: Insufficient documentation

## 2023-10-09 DIAGNOSIS — Z7985 Long-term (current) use of injectable non-insulin antidiabetic drugs: Secondary | ICD-10-CM | POA: Diagnosis not present

## 2023-10-09 LAB — CBC (CANCER CENTER ONLY)
HCT: 57.4 % — ABNORMAL HIGH (ref 39.0–52.0)
Hemoglobin: 19.8 g/dL — ABNORMAL HIGH (ref 13.0–17.0)
MCH: 30.8 pg (ref 26.0–34.0)
MCHC: 34.5 g/dL (ref 30.0–36.0)
MCV: 89.3 fL (ref 80.0–100.0)
Platelet Count: 179 10*3/uL (ref 150–400)
RBC: 6.43 MIL/uL — ABNORMAL HIGH (ref 4.22–5.81)
RDW: 13.2 % (ref 11.5–15.5)
WBC Count: 8.9 10*3/uL (ref 4.0–10.5)
nRBC: 0 % (ref 0.0–0.2)

## 2023-10-09 LAB — IRON AND TIBC
Iron: 95 ug/dL (ref 45–182)
Saturation Ratios: 23 % (ref 17.9–39.5)
TIBC: 417 ug/dL (ref 250–450)
UIBC: 322 ug/dL

## 2023-10-09 LAB — FERRITIN: Ferritin: 155 ng/mL (ref 24–336)

## 2023-10-09 NOTE — Progress Notes (Signed)
[facility]  [phone] [phone]  ID: [name] OB: 02-Feb-1961  MR#: [id number]  UMP#:[id number]  Patient Care Team: Eden Emms, NP as PCP - General (Pain Medicine) End, Cristal Deer, MD as PCP - Cardiology (Cardiology) Blair Promise, OD (Optometry)  CHIEF COMPLAINT: Polycythemia.  INTERVAL HISTORY: Patient is a 62 year old male who is noted to have an elevated hemoglobin on routine blood work.  He has occasional abdominal cramping which he relates to using Ozempic, but otherwise feels well.  He has no neurologic complaints.  He denies any recent fevers or illnesses.  He has a good appetite and denies weight loss.  He has no chest pain, shortness of breath, cough, or hemoptysis.  He denies any nausea, vomiting, constipation, diarrhea.  He has no urinary complaints.  Patient otherwise feels well and offers no further specific complaints today.  REVIEW OF SYSTEMS:   Review of Systems  Constitutional: Negative.  Negative for fever, malaise/fatigue and weight loss.  Respiratory: Negative.  Negative for cough, hemoptysis and shortness of breath.   Cardiovascular: Negative.  Negative for chest pain and leg swelling.  Gastrointestinal: Negative.  Negative for abdominal pain.  Genitourinary: Negative.  Negative for dysuria.  Musculoskeletal: Negative.  Negative for back pain.  Skin: Negative.  Negative for rash.  Neurological: Negative.  Negative for dizziness, focal weakness, weakness and headaches.    As per HPI. Otherwise, a complete review of systems is negative.  PAST MEDICAL HISTORY: Past Medical History:  Diagnosis Date   Arthritis    CAD (coronary artery disease)    a. S/p multiple PCIs (report of 14 prior stents in [place]).   CHF (congestive heart failure) (HCC)    Depression    Diabetes mellitus without complication (HCC)    Diverticulitis    Familial hyperlipidemia    a. 02/2019 seen in lipid clinic-->Praluent started.    Hypertension    Ischemic cardiomyopathy    a. 01/18/2016 Echo [facility] [place] Nl LV size. EF 30-35% w/ inf HK. Trace AI, mild MR. Trace to mild TR. Nl RV size/fxn; b. 11/18/2017 Echo: Mildly dil LV w/ nl wall thickness. LVEF 50-55% w/ no rwma. Nl RV size/fxn.   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   Myocardial infarction (HCC)    X2    PAST SURGICAL HISTORY: Past Surgical History:  Procedure Laterality Date   CARDIAC CATHETERIZATION  2012   x4 stents   CARDIAC CATHETERIZATION  2014   x6 stents   CARDIAC CATHETERIZATION  2014   x2 stents   HERNIA REPAIR     JOINT REPLACEMENT     partial then full   KNEE SURGERY Right    SHOULDER ARTHROSCOPY WITH OPEN ROTATOR CUFF REPAIR AND DISTAL CLAVICLE ACROMINECTOMY Left 05/22/2018   Procedure: SHOULDER ARTHROSCOPY WITH OPEN ROTATOR CUFF REPAIR, DISTAL CLAVICLE EXCISION, AND SUBACROMINAL DECOMPRESSION;  Surgeon: Juanell Fairly, MD;  Location: [facility] ORS;  Service: Orthopedics;  Laterality: Left;   SMALL INTESTINE SURGERY     TONSILLECTOMY      FAMILY HISTORY: Family History  Problem Relation Age of Onset   Hyperlipidemia Mother    Other Father        parasite infection   Mental illness Maternal Grandmother     ADVANCED DIRECTIVES (Y/N):  N  HEALTH MAINTENANCE: Social History   Tobacco Use   Smoking status: Every Day    Current packs/day: 1.25    Average packs/day: 1.3 packs/day for 43.0 years (53.8 ttl pk-yrs)  Types: Cigarettes   Smokeless tobacco: Never  Substance Use Topics   Alcohol use: No   Drug use: No     Colonoscopy:  PAP:  Bone density:  Lipid panel:  Allergies  Allergen Reactions   Other Nausea And Vomiting    VASCEPA 1 g   Icosapent Ethyl (Epa Ethyl Ester) (Fish)     Brand Name Vascepa - reflux, GI issues   Ivp Dye [Iodinated Contrast Media] Hives    Current Outpatient Medications  Medication Sig Dispense Refill   Alirocumab (PRALUENT) 150 MG/ML SOAJ INJECT 1 PEN INTO THE  SKIN EVERY 14 (FOURTEEN) DAYS. 2 mL 11   aspirin EC 81 MG tablet Take 81 mg by mouth daily.     atorvastatin (LIPITOR) 40 MG tablet TAKE 1 TABLET BY MOUTH EVERY DAY 90 tablet 3   clopidogrel (PLAVIX) 75 MG tablet TAKE 1 TABLET BY MOUTH EVERY DAY 90 tablet 3   FARXIGA 10 MG TABS tablet TAKE 1 TABLET BY MOUTH DAILY BEFORE BREAKFAST. 30 tablet 2   lisinopril (ZESTRIL) 5 MG tablet TAKE 1 TABLET (5 MG TOTAL) BY MOUTH DAILY. 90 tablet 1   Magnesium Oxide -Mg Supplement 200 MG TABS Take 1 tablet (200 mg total) by mouth daily. (Patient taking differently: Take 250 mg by mouth daily.) 7 tablet 0   metFORMIN (GLUCOPHAGE) 500 MG tablet TAKE 2 TABLETS (1,000 MG TOTAL) BY MOUTH 2 (TWO) TIMES DAILY WITH A MEAL. 360 tablet 1   metoprolol succinate (TOPROL-XL) 25 MG 24 hr tablet TAKE 1 TABLET BY MOUTH TWICE A DAY 180 tablet 0   pantoprazole (PROTONIX) 20 MG tablet Take 1 tablet (20 mg total) by mouth daily. 90 tablet 0   Semaglutide,0.25 or 0.5MG /DOS, (OZEMPIC, 0.25 OR 0.5 MG/DOSE,) 2 MG/3ML SOPN INJECT 0.5 MG INTO THE SKIN ONE TIME PER WEEK (Patient not taking: Reported on 10/09/2023) 3 mL 0   No current facility-administered medications for this visit.    OBJECTIVE: Vitals:   10/09/23 1122  BP: (!) 134/98  Pulse: 99  Resp: 16  Temp: (!) 97.1 F (36.2 C)  SpO2: 99%     Body mass index is 24.32 kg/m.    ECOG FS:0 - Asymptomatic  General: Well-developed, well-nourished, no acute distress. Eyes: Pink conjunctiva, anicteric sclera. HEENT: Normocephalic, moist mucous membranes. Lungs: No audible wheezing or coughing. Heart: Regular rate and rhythm. Abdomen: Soft, nontender, no obvious distention. Musculoskeletal: No edema, cyanosis, or clubbing. Neuro: Alert, answering all questions appropriately. Cranial nerves grossly intact. Skin: No rashes or petechiae noted. Psych: Normal affect. Lymphatics: No cervical, calvicular, axillary or inguinal LAD.   LAB RESULTS:  Lab Results  Component Value  Date   NA 141 10/01/2023   K 4.1 10/01/2023   CL 102 10/01/2023   CO2 30 10/01/2023   GLUCOSE 132 (H) 10/01/2023   BUN 13 10/01/2023   CREATININE 0.76 10/01/2023   CALCIUM 9.8 10/01/2023   PROT 7.1 10/01/2023   ALBUMIN 4.6 10/01/2023   AST 15 10/01/2023   ALT 18 10/01/2023   ALKPHOS 116 10/01/2023   BILITOT 1.0 10/01/2023   GFRNONAA >60 05/19/2018   GFRAA >60 05/19/2018    Lab Results  Component Value Date   WBC 6.7 10/01/2023   NEUTROABS 4.6 01/27/2020   HGB 18.5 Repeated and verified X2. (HH) 10/01/2023   HCT 54.3 (H) 10/01/2023   MCV 92.6 10/01/2023   PLT 195.0 10/01/2023     STUDIES: No results found.  ASSESSMENT: Polycythemia.  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  Patient's most recent hemoglobin is 18.5.  All of his other laboratory work including iron stores, carbon monoxide level, erythropoietin level, and JAK2 mutation with reflex are all pending at time of dictation.  Patient will to clinic in 1 month after the holidays for further evaluation and initiation of phlebotomy. Smoking cessation: Patient has no interest in stopping tobacco use at this time.  I spent a total of 45 minutes reviewing chart data, face-to-face evaluation with the patient, counseling and coordination of care as detailed above.   Patient expressed understanding and was in agreement with this plan. He also understands that He can call clinic at any time with any questions, concerns, or complaints.    Jeralyn Ruths, MD   10/09/2023 12:18 PM

## 2023-10-11 LAB — CARBON MONOXIDE, BLOOD (PERFORMED AT REF LAB): Carbon Monoxide, Blood: 8.6 % — ABNORMAL HIGH (ref 0.0–3.6)

## 2023-10-11 LAB — ERYTHROPOIETIN: Erythropoietin: 9.3 m[IU]/mL (ref 2.6–18.5)

## 2023-10-20 LAB — JAK2 V617F RFX CALR/MPL/E12-15

## 2023-10-20 LAB — CALR +MPL + E12-E15  (REFLEX)

## 2023-10-28 ENCOUNTER — Other Ambulatory Visit: Payer: Self-pay | Admitting: Nurse Practitioner

## 2023-11-06 ENCOUNTER — Telehealth: Payer: Self-pay

## 2023-11-06 ENCOUNTER — Other Ambulatory Visit: Payer: Self-pay

## 2023-11-06 DIAGNOSIS — Z87891 Personal history of nicotine dependence: Secondary | ICD-10-CM

## 2023-11-06 DIAGNOSIS — Z122 Encounter for screening for malignant neoplasm of respiratory organs: Secondary | ICD-10-CM

## 2023-11-06 DIAGNOSIS — F17210 Nicotine dependence, cigarettes, uncomplicated: Secondary | ICD-10-CM

## 2023-11-06 NOTE — Telephone Encounter (Signed)
 Returned call and left VM to schedule annual LDCT.  Last CT with [facility] program 2022.

## 2023-11-08 ENCOUNTER — Other Ambulatory Visit: Payer: Self-pay | Admitting: *Deleted

## 2023-11-08 DIAGNOSIS — D751 Secondary polycythemia: Secondary | ICD-10-CM

## 2023-11-11 ENCOUNTER — Other Ambulatory Visit: Payer: Self-pay | Admitting: Oncology

## 2023-11-11 ENCOUNTER — Encounter: Payer: Self-pay | Admitting: Oncology

## 2023-11-11 ENCOUNTER — Inpatient Hospital Stay (HOSPITAL_BASED_OUTPATIENT_CLINIC_OR_DEPARTMENT_OTHER): Payer: Medicare HMO | Admitting: Oncology

## 2023-11-11 ENCOUNTER — Inpatient Hospital Stay: Payer: Medicare HMO | Attending: Oncology

## 2023-11-11 ENCOUNTER — Inpatient Hospital Stay: Payer: Medicare HMO

## 2023-11-11 VITALS — BP 132/98 | HR 83 | Temp 97.6°F | Resp 19 | Wt 179.4 lb

## 2023-11-11 VITALS — BP 115/72 | HR 79 | Resp 18

## 2023-11-11 DIAGNOSIS — D751 Secondary polycythemia: Secondary | ICD-10-CM

## 2023-11-11 DIAGNOSIS — F17210 Nicotine dependence, cigarettes, uncomplicated: Secondary | ICD-10-CM | POA: Insufficient documentation

## 2023-11-11 LAB — FERRITIN: Ferritin: 166 ng/mL (ref 24–336)

## 2023-11-11 LAB — CBC WITH DIFFERENTIAL/PLATELET
Abs Immature Granulocytes: 0.03 10*3/uL (ref 0.00–0.07)
Basophils Absolute: 0 10*3/uL (ref 0.0–0.1)
Basophils Relative: 1 %
Eosinophils Absolute: 0.1 10*3/uL (ref 0.0–0.5)
Eosinophils Relative: 1 %
HCT: 52.3 % — ABNORMAL HIGH (ref 39.0–52.0)
Hemoglobin: 18.2 g/dL — ABNORMAL HIGH (ref 13.0–17.0)
Immature Granulocytes: 1 %
Lymphocytes Relative: 27 %
Lymphs Abs: 1.8 10*3/uL (ref 0.7–4.0)
MCH: 31.2 pg (ref 26.0–34.0)
MCHC: 34.8 g/dL (ref 30.0–36.0)
MCV: 89.6 fL (ref 80.0–100.0)
Monocytes Absolute: 0.5 10*3/uL (ref 0.1–1.0)
Monocytes Relative: 7 %
Neutro Abs: 4.1 10*3/uL (ref 1.7–7.7)
Neutrophils Relative %: 63 %
Platelets: 168 10*3/uL (ref 150–400)
RBC: 5.84 MIL/uL — ABNORMAL HIGH (ref 4.22–5.81)
RDW: 13 % (ref 11.5–15.5)
WBC: 6.5 10*3/uL (ref 4.0–10.5)
nRBC: 0 % (ref 0.0–0.2)

## 2023-11-11 LAB — IRON AND TIBC
Iron: 102 ug/dL (ref 45–182)
Saturation Ratios: 27 % (ref 17.9–39.5)
TIBC: 381 ug/dL (ref 250–450)
UIBC: 279 ug/dL

## 2023-11-11 NOTE — Progress Notes (Signed)
 [facility]  Telephone:([phone]) [phone] [phone]  ID: Laurene Boos OB: 1961/03/25  MR#: [id number]  [id number]#:[id number]  Patient Care Team: Wendee Lynwood HERO, NP as PCP - General (Pain Medicine) End, Lonni, MD as PCP - Cardiology (Cardiology) Portia Fireman, OD (Optometry)  CHIEF COMPLAINT: Polycythemia.  INTERVAL HISTORY: Patient returns to clinic today for repeat laboratory work, further evaluation, and consideration of phlebotomy.  He currently feels well and is asymptomatic.  He has no neurologic complaints.  He denies any recent fevers or illnesses.  He has a good appetite and denies weight loss.  He has no chest pain, shortness of breath, cough, or hemoptysis.  He denies any nausea, vomiting, constipation, diarrhea.  He has no urinary complaints.  Patient offers no specific complaints today.  REVIEW OF SYSTEMS:   Review of Systems  Constitutional: Negative.  Negative for fever, malaise/fatigue and weight loss.  Respiratory: Negative.  Negative for cough, hemoptysis and shortness of breath.   Cardiovascular: Negative.  Negative for chest pain and leg swelling.  Gastrointestinal: Negative.  Negative for abdominal pain.  Genitourinary: Negative.  Negative for dysuria.  Musculoskeletal: Negative.  Negative for back pain.  Skin: Negative.  Negative for rash.  Neurological: Negative.  Negative for dizziness, focal weakness, weakness and headaches.    As per HPI. Otherwise, a complete review of systems is negative.  PAST MEDICAL HISTORY: Past Medical History:  Diagnosis Date   Arthritis    CAD (coronary artery disease)    a. S/p multiple PCIs (report of 14 prior stents in [place]   CHF (congestive heart failure) (HCC)    Depression    Diabetes mellitus without complication (HCC)    Diverticulitis    Familial hyperlipidemia    a. 02/2019 seen in lipid clinic-->Praluent  started.   Hypertension    Ischemic cardiomyopathy    a. 01/18/2016 Echo [facility] [place] Nl LV size. EF 30-35% w/ inf HK. Trace AI, mild MR. Trace to mild TR. Nl RV size/fxn; b. 11/18/2017 Echo: Mildly dil LV w/ nl wall thickness. LVEF 50-55% w/ no rwma. Nl RV size/fxn.   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   Myocardial infarction (HCC)    X2    PAST SURGICAL HISTORY: Past Surgical History:  Procedure Laterality Date   CARDIAC CATHETERIZATION  2012   x4 stents   CARDIAC CATHETERIZATION  2014   x6 stents   CARDIAC CATHETERIZATION  2014   x2 stents   HERNIA REPAIR     JOINT REPLACEMENT     partial then full   KNEE SURGERY Right    SHOULDER ARTHROSCOPY WITH OPEN ROTATOR CUFF REPAIR AND DISTAL CLAVICLE ACROMINECTOMY Left 05/22/2018   Procedure: SHOULDER ARTHROSCOPY WITH OPEN ROTATOR CUFF REPAIR, DISTAL CLAVICLE EXCISION, AND SUBACROMINAL DECOMPRESSION;  Surgeon: Marchia Drivers, MD;  Location: [facility] ORS;  Service: Orthopedics;  Laterality: Left;   SMALL INTESTINE SURGERY     TONSILLECTOMY      FAMILY HISTORY: Family History  Problem Relation Age of Onset   Hyperlipidemia Mother    Other Father        parasite infection   Mental illness Maternal Grandmother     ADVANCED DIRECTIVES (Y/N):  N  HEALTH MAINTENANCE: Social History   Tobacco Use   Smoking status: Every Day    Current packs/day: 1.25    Average packs/day: 1.3 packs/day for 43.0 years (53.8 ttl pk-yrs)    Types: Cigarettes   Smokeless tobacco: Never  Substance Use Topics  Alcohol use: No   Drug use: No     Colonoscopy:  PAP:  Bone density:  Lipid panel:  Allergies  Allergen Reactions   Other Nausea And Vomiting    VASCEPA  1 g   Icosapent  Ethyl (Epa Ethyl Ester) (Fish)     Brand Name Vascepa  - reflux, GI issues   Ivp Dye [Iodinated Contrast Media] Hives    Current Outpatient Medications  Medication Sig Dispense Refill   Alirocumab  (PRALUENT ) 150 MG/ML SOAJ INJECT 1 PEN INTO THE SKIN EVERY 14 (FOURTEEN) DAYS. 2 mL 11   aspirin EC 81 MG tablet Take  81 mg by mouth daily.     atorvastatin  (LIPITOR) 40 MG tablet TAKE 1 TABLET BY MOUTH EVERY DAY 90 tablet 3   clopidogrel  (PLAVIX ) 75 MG tablet TAKE 1 TABLET BY MOUTH EVERY DAY 90 tablet 3   FARXIGA  10 MG TABS tablet TAKE 1 TABLET BY MOUTH DAILY BEFORE BREAKFAST. 30 tablet 2   lisinopril  (ZESTRIL ) 5 MG tablet TAKE 1 TABLET (5 MG TOTAL) BY MOUTH DAILY. 90 tablet 1   Magnesium  Oxide -Mg Supplement 200 MG TABS Take 1 tablet (200 mg total) by mouth daily. (Patient taking differently: Take 250 mg by mouth daily.) 7 tablet 0   metFORMIN  (GLUCOPHAGE ) 500 MG tablet TAKE 2 TABLETS (1,000 MG TOTAL) BY MOUTH 2 (TWO) TIMES DAILY WITH A MEAL. 360 tablet 1   metoprolol  succinate (TOPROL -XL) 25 MG 24 hr tablet TAKE 1 TABLET BY MOUTH TWICE A DAY 180 tablet 0   pantoprazole  (PROTONIX ) 20 MG tablet Take 1 tablet (20 mg total) by mouth daily. 90 tablet 0   Semaglutide ,0.25 or 0.5MG /DOS, (OZEMPIC , 0.25 OR 0.5 MG/DOSE,) 2 MG/3ML SOPN INJECT 0.5 MG INTO THE SKIN ONE TIME PER WEEK (Patient not taking: Reported on 11/11/2023) 9 mL 1   No current facility-administered medications for this visit.    OBJECTIVE: Vitals:   11/11/23 1408  BP: (!) 132/98  Pulse: 83  Resp: 19  Temp: 97.6 F (36.4 C)  SpO2: 99%     Body mass index is 25.02 kg/m.    ECOG FS:0 - Asymptomatic  General: Well-developed, well-nourished, no acute distress. Eyes: Pink conjunctiva, anicteric sclera. HEENT: Normocephalic, moist mucous membranes. Lungs: No audible wheezing or coughing. Heart: Regular rate and rhythm. Abdomen: Soft, nontender, no obvious distention. Musculoskeletal: No edema, cyanosis, or clubbing. Neuro: Alert, answering all questions appropriately. Cranial nerves grossly intact. Skin: No rashes or petechiae noted. Psych: Normal affect.  LAB RESULTS:  Lab Results  Component Value Date   NA 141 10/01/2023   K 4.1 10/01/2023   CL 102 10/01/2023   CO2 30 10/01/2023   GLUCOSE 132 (H) 10/01/2023   BUN 13 10/01/2023    CREATININE 0.76 10/01/2023   CALCIUM  9.8 10/01/2023   PROT 7.1 10/01/2023   ALBUMIN 4.6 10/01/2023   AST 15 10/01/2023   ALT 18 10/01/2023   ALKPHOS 116 10/01/2023   BILITOT 1.0 10/01/2023   GFRNONAA >60 05/19/2018   GFRAA >60 05/19/2018    Lab Results  Component Value Date   WBC 6.5 11/11/2023   NEUTROABS 4.1 11/11/2023   HGB 18.2 (H) 11/11/2023   HCT 52.3 (H) 11/11/2023   MCV 89.6 11/11/2023   PLT 168 11/11/2023     STUDIES: No results found.  ASSESSMENT: Polycythemia.  PLAN:    Polycythemia: Likely secondary to tobacco use.  Patient has an elevated carboxyhemoglobin.  Patient's most recent hemoglobin is 18.2.  All of his other laboratory work including iron  stores, carbon monoxide level, erythropoietin  level, and JAK2 mutation with reflex are all negative or within normal limits.  Proceed with 500 mL phlebotomy today.  Goal hemoglobin is 17.0.  Return to clinic in 2 months with repeat laboratory work, further evaluation, and continuation of treatment if needed. Smoking cessation: Patient has no interest in stopping tobacco use at this time.  I spent a total of 30 minutes reviewing chart data, face-to-face evaluation with the patient, counseling and coordination of care as detailed above.   Patient expressed understanding and was in agreement with this plan. He also understands that He can call clinic at any time with any questions, concerns, or complaints.    Evalene JINNY Reusing, MD   11/11/2023 2:24 PM

## 2023-11-25 ENCOUNTER — Encounter: Payer: Self-pay | Admitting: Oncology

## 2023-11-26 ENCOUNTER — Ambulatory Visit
Admission: RE | Admit: 2023-11-26 | Discharge: 2023-11-26 | Disposition: A | Discharge status: Home / Self Care | Payer: Medicare HMO | Source: Ambulatory Visit | Attending: Acute Care | Admitting: Acute Care

## 2023-11-26 DIAGNOSIS — F17210 Nicotine dependence, cigarettes, uncomplicated: Secondary | ICD-10-CM | POA: Diagnosis present

## 2023-11-26 DIAGNOSIS — Z87891 Personal history of nicotine dependence: Secondary | ICD-10-CM | POA: Insufficient documentation

## 2023-11-26 DIAGNOSIS — Z122 Encounter for screening for malignant neoplasm of respiratory organs: Secondary | ICD-10-CM | POA: Insufficient documentation

## 2023-11-26 NOTE — Progress Notes (Unsigned)
Cardiology Office Note:  .   Date:  11/27/2023  ID:  John Mathis, DOB 13-Feb-1961, MRN [id number] PCP: Eden Emms, NP  [facility] Providers Cardiologist:  Yvonne Kendall, MD    Patient Profile: .      PMH Coronary artery disease Reported has 14 prior stent placements when living in [place] Ischemic cardiomyopathy  LVEF 30-35 in 2017 Improved to 50-55 in 2019 Hypertension Dyslipidemia Type 2 DM Lyme disease Tobacco abuse Family history CAD   Referred to Advanced Lipid disorder clinic and seen by Dr. Rennis Golden 02/2019. History of CAD with report of 14 prior stents in [place].  Primary cardiology is followed by Dr. Okey Dupre.  He has previously been prescribed Chantix but has not quit smoking.  His mother's family has a history of marked dyslipidemia.  He has 3 children, the 2 older children apparently have been tested for dyslipidemia and had no significant elevations.  Despite being on high intensity statin 80 mg daily, his most recent direct LDL was 179, his triglycerides were elevated at 222 and his HDL is low at 30.  This metabolic profile suggest possible combination of metabolic syndrome and familial hyperlipidemia or perhaps familial combined hyperlipidemia.  His untreated LDL would likely be much greater than 250 if he were off statin.  He was started on Praluent with marked reduction and cholesterol with triglycerides down to 110 and LDL of 2.  Atorvastatin was decreased to 40 mg daily.  Triglycerides unfortunately were elevated at follow-up appointment and seem to be quite refractory to treatment.  His A1c was elevated as well.  He was felt to be a good candidate for Vascepa, unfortunately he did not tolerate it due to reflux symptoms.  At last lipid clinic visit with Dr. Rennis Golden on 12/07/2021, he reported he had been eating better and better stress management.  Lipids at that time showed total cholesterol 75, triglycerides 196, HDL 35, and LDL 9.  He is currently participating in the  [employer] study.        History of Present Illness: .   John Mathis is a pleasant 63 y.o. male who is here today for follow-up of CAD and dyslipidemia. He is feeling well. He reports that his participation in a research study has ended and he did not review the summary provided. He reports he is not overly concerned for his elevated triglycerides and LDL cholesterol, despite understanding the potential risks. He reports adherence to a low-fat diet and regular exercise in the form of walking his dog four times daily. He has been on Praluent injections and atorvastatin 40mg , but ran out of Praluent approximately four weeks prior to his last blood work. He reports discomfort with the Praluent injections and has been adjusting the frequency of administration due to previously low LDL levels. He also reports a history of acid reflux and stomach pain with semaglutide which he has stopped. It was prescribed for management of diabetes.  He denies chest pain, shortness of breath, orthopnea, PND, palpitations, presyncope, or syncope.  Discussed the use of AI scribe software for clinical note transcription with the patient, who gave verbal consent to proceed.   ROS: See HPI       Studies Reviewed: .        Risk Assessment/Calculations:             Physical Exam:   VS:  BP 106/66 (BP Location: Right Arm, Patient Position: Sitting, Cuff Size: Normal)   Pulse 84   Ht  5\' 11"  (1.803 m)   Wt 178 lb 9.6 oz (81 kg)   SpO2 96%   BMI 24.91 kg/m    Wt Readings from Last 3 Encounters:  11/27/23 178 lb 9.6 oz (81 kg)  11/26/23 175 lb (79.4 kg)  11/11/23 179 lb 6.4 oz (81.4 kg)    GEN: Well nourished, well developed in no acute distress NECK: No JVD; No carotid bruits CARDIAC: No increased work of breathing, no murmurs, rubs, gallops RESPIRATORY:  Clear to auscultation without rales, wheezing or rhonchi  ABDOMEN: Soft, non-tender, non-distended EXTREMITIES:  No edema; No deformity      ASSESSMENT AND PLAN: .    Dyslipidemia: Lipid panel 10/01/2023 with total cholesterol 159, triglycerides 208, HDL 30.2, and LDL 88.  He had a significant increase in LDL from 9 in 2023.  He admits he had run out of Praluent at the time of most recent lab work.  He reports he is back on schedule with injections every 3 weeks as previously advised by Dr. Rennis Golden.  He continues to take atorvastatin with no concerning side effects.  We will plan to get NMR as soon as he can return fasting.  Provided a refill of Praluent today. Encouraged heart healthy mostly plant based diet avoiding saturated fat, processed foods, simple carbohydrates, and sugar along with aiming for at least 150 minutes of moderate intensity exercise each week.   CAD: History of multiple prior stents in [place]. He denies chest pain, dyspnea, or other symptoms concerning for angina. No indication for further ischemic evaluation at this time. He would like to follow-up with Dr. Okey Dupre for general cardiology.  He is overdue for follow-up. We will get that arranged today. Continue secondary prevention.   Hypertension: BP is well-controlled.  He denies recent concerns with elevated BP.  Renal function stable on labs completed 10/01/23.  No medication changes today.  Continue lisinopril, metoprolol.       Disposition: Schedule follow-up with Dr. End/1 year with Dr. Rennis Golden or me for lipid follow-up  Signed, Eligha Bridegroom, NP-C

## 2023-11-27 ENCOUNTER — Encounter (HOSPITAL_BASED_OUTPATIENT_CLINIC_OR_DEPARTMENT_OTHER): Payer: Self-pay | Admitting: Nurse Practitioner

## 2023-11-27 ENCOUNTER — Ambulatory Visit (HOSPITAL_BASED_OUTPATIENT_CLINIC_OR_DEPARTMENT_OTHER): Payer: Medicare HMO | Admitting: Nurse Practitioner

## 2023-11-27 ENCOUNTER — Other Ambulatory Visit (HOSPITAL_BASED_OUTPATIENT_CLINIC_OR_DEPARTMENT_OTHER): Payer: Self-pay | Admitting: *Deleted

## 2023-11-27 VITALS — BP 106/66 | HR 84 | Ht 71.0 in | Wt 178.6 lb

## 2023-11-27 DIAGNOSIS — E785 Hyperlipidemia, unspecified: Secondary | ICD-10-CM

## 2023-11-27 DIAGNOSIS — I2510 Atherosclerotic heart disease of native coronary artery without angina pectoris: Secondary | ICD-10-CM

## 2023-11-27 DIAGNOSIS — E1169 Type 2 diabetes mellitus with other specified complication: Secondary | ICD-10-CM | POA: Diagnosis not present

## 2023-11-27 MED ORDER — PRALUENT 150 MG/ML ~~LOC~~ SOAJ: 150.0000 mg | SUBCUTANEOUS | 6 refills | Status: AC

## 2023-11-27 NOTE — Patient Instructions (Addendum)
Medication Instructions:   Your physician recommends that you continue on your current medications as directed. Please refer to the Current Medication list given to you today.   *If you need a refill on your cardiac medications before your next appointment, please call your pharmacy*   Lab Work:  Your physician recommends that you return for a FASTING NMR at your convenience, fasting after midnight. Paperwork given to patient today.     If you have labs (blood work) drawn today and your tests are completely normal, you will receive your results only by: MyChart Message (if you have MyChart) OR A paper copy in the mail If you have any lab test that is abnormal or we need to change your treatment, we will call you to review the results.   Testing/Procedures:   None ordered.   Follow-Up: At [facility], you and your health needs are our priority.  As part of our continuing mission to provide you with exceptional heart care, we have created designated Provider Care Teams.  These Care Teams include your primary Cardiologist (physician) and Advanced Practice Providers (APPs -  Physician Assistants and Nurse Practitioners) who all work together to provide you with the care you need, when you need it.  We recommend signing up for the patient portal called "MyChart".  Sign up information is provided on this After Visit Summary.  MyChart is used to connect with patients for Virtual Visits (Telemedicine).  Patients are able to view lab/test results, encounter notes, upcoming appointments, etc.  Non-urgent messages can be sent to your provider as well.   To learn more about what you can do with MyChart, go to [email or web address].    Your next appointment:   3 month(s)  Provider:   Yvonne Kendall, MD in 3 months and [place] Hilty, MD or Eligha Bridegroom, NP in one year for lipid clinic.

## 2023-12-06 ENCOUNTER — Other Ambulatory Visit: Payer: Self-pay

## 2023-12-06 ENCOUNTER — Other Ambulatory Visit: Payer: Self-pay | Admitting: Nurse Practitioner

## 2023-12-06 DIAGNOSIS — Z87891 Personal history of nicotine dependence: Secondary | ICD-10-CM

## 2023-12-06 DIAGNOSIS — Z122 Encounter for screening for malignant neoplasm of respiratory organs: Secondary | ICD-10-CM

## 2023-12-06 DIAGNOSIS — F17210 Nicotine dependence, cigarettes, uncomplicated: Secondary | ICD-10-CM

## 2023-12-07 ENCOUNTER — Other Ambulatory Visit: Payer: Self-pay | Admitting: Nurse Practitioner

## 2023-12-07 ENCOUNTER — Other Ambulatory Visit: Payer: Self-pay | Admitting: Internal Medicine

## 2023-12-07 DIAGNOSIS — E119 Type 2 diabetes mellitus without complications: Secondary | ICD-10-CM

## 2023-12-29 ENCOUNTER — Other Ambulatory Visit: Payer: Self-pay | Admitting: Nurse Practitioner

## 2023-12-29 DIAGNOSIS — K219 Gastro-esophageal reflux disease without esophagitis: Secondary | ICD-10-CM

## 2024-01-09 ENCOUNTER — Encounter: Payer: Self-pay | Admitting: Oncology

## 2024-01-09 ENCOUNTER — Inpatient Hospital Stay: Payer: Medicare HMO | Attending: Oncology

## 2024-01-09 ENCOUNTER — Inpatient Hospital Stay: Payer: Medicare HMO

## 2024-01-09 ENCOUNTER — Inpatient Hospital Stay: Payer: Medicare HMO | Admitting: Oncology

## 2024-01-09 VITALS — BP 126/91 | HR 78 | Temp 96.6°F | Resp 18 | Ht 71.0 in | Wt 176.7 lb

## 2024-01-09 DIAGNOSIS — D751 Secondary polycythemia: Secondary | ICD-10-CM

## 2024-01-09 DIAGNOSIS — F17210 Nicotine dependence, cigarettes, uncomplicated: Secondary | ICD-10-CM | POA: Insufficient documentation

## 2024-01-09 LAB — IRON AND TIBC
Iron: 125 ug/dL (ref 45–182)
Saturation Ratios: 29 % (ref 17.9–39.5)
TIBC: 431 ug/dL (ref 250–450)
UIBC: 306 ug/dL

## 2024-01-09 LAB — CBC WITH DIFFERENTIAL (CANCER CENTER ONLY)
Abs Immature Granulocytes: 0.03 10*3/uL (ref 0.00–0.07)
Basophils Absolute: 0 10*3/uL (ref 0.0–0.1)
Basophils Relative: 0 %
Eosinophils Absolute: 0.1 10*3/uL (ref 0.0–0.5)
Eosinophils Relative: 1 %
HCT: 54.8 % — ABNORMAL HIGH (ref 39.0–52.0)
Hemoglobin: 18.9 g/dL — ABNORMAL HIGH (ref 13.0–17.0)
Immature Granulocytes: 0 %
Lymphocytes Relative: 24 %
Lymphs Abs: 1.9 10*3/uL (ref 0.7–4.0)
MCH: 31.3 pg (ref 26.0–34.0)
MCHC: 34.5 g/dL (ref 30.0–36.0)
MCV: 90.9 fL (ref 80.0–100.0)
Monocytes Absolute: 0.5 10*3/uL (ref 0.1–1.0)
Monocytes Relative: 7 %
Neutro Abs: 5.6 10*3/uL (ref 1.7–7.7)
Neutrophils Relative %: 68 %
Platelet Count: 191 10*3/uL (ref 150–400)
RBC: 6.03 MIL/uL — ABNORMAL HIGH (ref 4.22–5.81)
RDW: 12.9 % (ref 11.5–15.5)
WBC Count: 8.2 10*3/uL (ref 4.0–10.5)
nRBC: 0 % (ref 0.0–0.2)

## 2024-01-09 LAB — FERRITIN: Ferritin: 63 ng/mL (ref 24–336)

## 2024-01-09 NOTE — Progress Notes (Unsigned)
 [facility]  [phone] [phone]  ID: John Mathis OB: [birth year]  MR#: [id number]  YQI#:[id number]  Patient Care Team: Eden Emms, NP as PCP - General (Pain Medicine) End, Cristal Deer, MD as PCP - Cardiology (Cardiology) Blair Promise, OD (Optometry)  CHIEF COMPLAINT: Polycythemia.  INTERVAL HISTORY: Patient returns to clinic today for repeat laboratory work, further evaluation, and consideration of phlebotomy.  He currently feels well and is asymptomatic.  He has no neurologic complaints.  He denies any recent fevers or illnesses.  He has a good appetite and denies weight loss.  He has no chest pain, shortness of breath, cough, or hemoptysis.  He denies any nausea, vomiting, constipation, diarrhea.  He has no urinary complaints.  Patient offers no specific complaints today.  REVIEW OF SYSTEMS:   Review of Systems  Constitutional: Negative.  Negative for fever, malaise/fatigue and weight loss.  Respiratory: Negative.  Negative for cough, hemoptysis and shortness of breath.   Cardiovascular: Negative.  Negative for chest pain and leg swelling.  Gastrointestinal: Negative.  Negative for abdominal pain.  Genitourinary: Negative.  Negative for dysuria.  Musculoskeletal: Negative.  Negative for back pain.  Skin: Negative.  Negative for rash.  Neurological: Negative.  Negative for dizziness, focal weakness, weakness and headaches.    As per HPI. Otherwise, a complete review of systems is negative.  PAST MEDICAL HISTORY: Past Medical History:  Diagnosis Date  . Arthritis   . CAD (coronary artery disease)    a. S/p multiple PCIs (report of 14 prior stents in [place]).  . CHF (congestive heart failure) (HCC)   . Depression   . Diabetes mellitus without complication (HCC)   . Diverticulitis   . Familial hyperlipidemia    a. 02/2019 seen in lipid clinic-->Praluent started.  . Hypertension   . Ischemic cardiomyopathy    a. 01/18/2016 Echo  [facility], [place]): Nl LV size. EF 30-35% w/ inf HK. Trace AI, mild MR. Trace to mild TR. Nl RV size/fxn; b. 11/18/2017 Echo: Mildly dil LV w/ nl wall thickness. LVEF 50-55% w/ no rwma. Nl RV size/fxn.  . 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  . Myocardial infarction (HCC)    X2    PAST SURGICAL HISTORY: Past Surgical History:  Procedure Laterality Date  . CARDIAC CATHETERIZATION  2012   x4 stents  . CARDIAC CATHETERIZATION  2014   x6 stents  . CARDIAC CATHETERIZATION  2014   x2 stents  . HERNIA REPAIR    . JOINT REPLACEMENT     partial then full  . KNEE SURGERY Right   . SHOULDER ARTHROSCOPY WITH OPEN ROTATOR CUFF REPAIR AND DISTAL CLAVICLE ACROMINECTOMY Left 05/22/2018   Procedure: SHOULDER ARTHROSCOPY WITH OPEN ROTATOR CUFF REPAIR, DISTAL CLAVICLE EXCISION, AND SUBACROMINAL DECOMPRESSION;  Surgeon: Juanell Fairly, MD;  Location: [facility] ORS;  Service: Orthopedics;  Laterality: Left;  . SMALL INTESTINE SURGERY    . TONSILLECTOMY      FAMILY HISTORY: Family History  Problem Relation Age of Onset  . Hyperlipidemia Mother   . Other Father        parasite infection  . Mental illness Maternal Grandmother     ADVANCED DIRECTIVES (Y/N):  N  HEALTH MAINTENANCE: Social History   Tobacco Use  . Smoking status: Every Day    Current packs/day: 1.25    Average packs/day: 1.3 packs/day for 43.0 years (53.8 ttl pk-yrs)    Types: Cigarettes  . Smokeless tobacco: Never  Substance Use Topics  .  Alcohol use: No  . Drug use: No     Colonoscopy:  PAP:  Bone density:  Lipid panel:  Allergies  Allergen Reactions  . Other Nausea And Vomiting    VASCEPA 1 g  . Icosapent Ethyl (Epa Ethyl Ester) (Fish)     Brand Name Vascepa - reflux, GI issues  . Ivp Dye [Iodinated Contrast Media] Hives    Current Outpatient Medications  Medication Sig Dispense Refill  . Alirocumab (PRALUENT) 150 MG/ML SOAJ Inject 1 mL (150 mg total) into the skin every 14 (fourteen)  days. 2 mL 6  . aspirin EC 81 MG tablet Take 81 mg by mouth daily.    Marland Kitchen atorvastatin (LIPITOR) 40 MG tablet TAKE 1 TABLET BY MOUTH EVERY DAY 90 tablet 0  . clopidogrel (PLAVIX) 75 MG tablet TAKE 1 TABLET BY MOUTH EVERY DAY 90 tablet 0  . FARXIGA 10 MG TABS tablet TAKE 1 TABLET BY MOUTH DAILY BEFORE BREAKFAST. 30 tablet 2  . lisinopril (ZESTRIL) 5 MG tablet TAKE 1 TABLET (5 MG TOTAL) BY MOUTH DAILY. 90 tablet 1  . Magnesium Oxide -Mg Supplement 200 MG TABS Take 1 tablet (200 mg total) by mouth daily. (Patient taking differently: Take 250 mg by mouth daily.) 7 tablet 0  . metFORMIN (GLUCOPHAGE) 500 MG tablet TAKE 2 TABLETS (1,000 MG TOTAL) BY MOUTH 2 (TWO) TIMES DAILY WITH A MEAL. 360 tablet 1  . metoprolol succinate (TOPROL-XL) 25 MG 24 hr tablet TAKE 1 TABLET BY MOUTH TWICE A DAY 180 tablet 0   No current facility-administered medications for this visit.    OBJECTIVE: Vitals:   01/09/24 1438  BP: (!) 126/91  Pulse: 78  Resp: 18  Temp: (!) 96.6 F (35.9 C)  SpO2: 100%     Body mass index is 24.64 kg/m.    ECOG FS:0 - Asymptomatic  General: Well-developed, well-nourished, no acute distress. Eyes: Pink conjunctiva, anicteric sclera. HEENT: Normocephalic, moist mucous membranes. Lungs: No audible wheezing or coughing. Heart: Regular rate and rhythm. Abdomen: Soft, nontender, no obvious distention. Musculoskeletal: No edema, cyanosis, or clubbing. Neuro: Alert, answering all questions appropriately. Cranial nerves grossly intact. Skin: No rashes or petechiae noted. Psych: Normal affect.  LAB RESULTS:  Lab Results  Component Value Date   NA 141 10/01/2023   K 4.1 10/01/2023   CL 102 10/01/2023   CO2 30 10/01/2023   GLUCOSE 132 (H) 10/01/2023   BUN 13 10/01/2023   CREATININE 0.76 10/01/2023   CALCIUM 9.8 10/01/2023   PROT 7.1 10/01/2023   ALBUMIN 4.6 10/01/2023   AST 15 10/01/2023   ALT 18 10/01/2023   ALKPHOS 116 10/01/2023   BILITOT 1.0 10/01/2023   GFRNONAA >60  05/19/2018   GFRAA >60 05/19/2018    Lab Results  Component Value Date   WBC 8.2 01/09/2024   NEUTROABS 5.6 01/09/2024   HGB 18.9 (H) 01/09/2024   HCT 54.8 (H) 01/09/2024   MCV 90.9 01/09/2024   PLT 191 01/09/2024     STUDIES: No results found.  ASSESSMENT: Polycythemia.  PLAN:    Polycythemia: Likely secondary to tobacco use.  Patient has an elevated carboxyhemoglobin.  Patient's most recent hemoglobin is 18.2.  All of his other laboratory work including iron stores, carbon monoxide level, erythropoietin level, and JAK2 mutation with reflex are all negative or within normal limits.  Proceed with 500 mL phlebotomy today.  Goal hemoglobin is 17.0.  Return to clinic in 2 months with repeat laboratory work, further evaluation, and continuation of treatment  if needed. Smoking cessation: Patient has no interest in stopping tobacco use at this time.  I spent a total of 30 minutes reviewing chart data, face-to-face evaluation with the patient, counseling and coordination of care as detailed above.   Patient expressed understanding and was in agreement with this plan. He also understands that He can call clinic at any time with any questions, concerns, or complaints.    Jeralyn Ruths, MD   01/09/2024 2:58 PM

## 2024-01-10 ENCOUNTER — Encounter: Payer: Self-pay | Admitting: Oncology

## 2024-01-15 ENCOUNTER — Ambulatory Visit: Payer: Self-pay | Admitting: Nurse Practitioner

## 2024-01-15 NOTE — Telephone Encounter (Signed)
 Copied from CRM [id number]. Topic: Clinical - Red Word Triage >> Jan 15, 2024  8:22 AM Orinda Kenner C wrote: Red Word that prompted transfer to Nurse Triage: Patient states has a boil under right armpit started 4 days ago, red, swollen, painful and burning; no oozing, bleeding, nor fever. Patient wants to be seen today. Please advise [phone]  Chief Complaint: boil to right armpit Symptoms: pain, swelling Frequency: constant Pertinent Negatives: Patient denies no bleeding, oozing, fever, cp, Disposition: [] ED /[] Urgent Care (no appt availability in office) / [x] Appointment(In office/virtual)/ []  [place] [facility]/ [] Home Care/ [] Refused Recommended Disposition /[] [facility]/ []  Follow-up with PCP Additional Notes: per protocol apt made for tomorrow am.  Care advice given, denies questions; instructed to go to ER if becomes worse.   Reason for Disposition  [1] Boil > 1/2 inch across (> 12 mm; larger than a marble) AND [2] center is soft or pus colored  Answer Assessment - Initial Assessment Questions 1. APPEARANCE of BOIL: "What does the boil look like?"      Red, swollen painful and burning to right armpit 2. LOCATION: "Where is the boil located?"      Right armpit 3. NUMBER: "How many boils are there?"      1 4. SIZE: "How big is the boil?" (e.g., inches, cm; compare to size of a coin or other object)     1.5x1.5 5. ONSET: "When did the boil start?"     4 day ago 6. PAIN: "Is there any pain?" If Yes, ask: "How bad is the pain?"   (Scale 1-10; or mild, moderate, severe)     4-5/10 7. FEVER: "Do you have a fever?" If Yes, ask: "What is it, how was it measured, and when did it start?"      denies 8. SOURCE: "Have you been around anyone with boils or other Staph infections?" "Have you ever had boils before?"     denies 9. OTHER SYMPTOMS: "Do you have any other symptoms?" (e.g., shaking chills, weakness, rash elsewhere on body)     denies 10. PREGNANCY: "Is there any  chance you are pregnant?" "When was your last menstrual period?"       no  Protocols used: Boil (Skin Abscess)-A-AH

## 2024-01-16 ENCOUNTER — Encounter: Payer: Self-pay | Admitting: Family Medicine

## 2024-01-16 ENCOUNTER — Ambulatory Visit: Admitting: Family Medicine

## 2024-01-16 VITALS — BP 90/70 | HR 78 | Temp 97.8°F | Ht 71.0 in | Wt 176.2 lb

## 2024-01-16 DIAGNOSIS — L02411 Cutaneous abscess of right axilla: Secondary | ICD-10-CM | POA: Diagnosis not present

## 2024-01-16 MED ORDER — DOXYCYCLINE HYCLATE 100 MG PO TABS: 100.0000 mg | ORAL_TABLET | Freq: Two times a day (BID) | ORAL | 0 refills | Status: DC

## 2024-01-16 NOTE — Addendum Note (Signed)
 Addended by: Damita Lack on: 01/16/2024 09:43 AM   Modules accepted: Orders

## 2024-01-16 NOTE — Progress Notes (Signed)
 Oriya Kettering T. Ottilia Pippenger, MD, CAQ Sports Medicine [facility] [address] [place], [address]  Phone: [phone]  FAX: [phone]  John Mathis - 63 y.o. male  MRN [id number]  Date of Birth: 1961/01/17  Date: 01/16/2024  PCP: Eden Emms, NP  Referral: Eden Emms, NP  Chief Complaint  Patient presents with   Recurrent Skin Infections    Boil Right Arm Pit   Subjective:   John Mathis is a 63 y.o. very pleasant male patient with Body mass index is 24.58 kg/m. who presents with the following:  Patient presents with onset of redness and swelling in the right axilla that started on Sunday.  Since then it has progressively been getting worse and has some redness and pain.  He has not had any systemic symptoms such as fever, chills, sweats or bodyaches.  Pain is localized to the axilla and the visualized picture below.  He has not had any drainage.  Sunday started - progressively getting worse  I and D.    Review of Systems is noted in the HPI, as appropriate  Objective:   BP 90/70 (BP Location: Left Arm, Patient Position: Sitting, Cuff Size: Normal)   Pulse 78   Temp 97.8 F (36.6 C) (Temporal)   Ht 5\' 11"  (1.803 m)   Wt 176 lb 4 oz (79.9 kg)   SpO2 97%   BMI 24.58 kg/m   GEN: No acute distress; alert,appropriate. PULM: Breathing comfortably in no respiratory distress PSYCH: Normally interactive.   The area as seen below is quite tender to palpation of the surrounding skin is also tender to palpation With compression there is a tiny bit of pus that is expressed at the most fluctuant area    Laboratory and Imaging Data:  Assessment and Plan:     ICD-10-CM   1. Abscess of right axilla  L02.411      Patient has a skin infection and abscess in right axilla.  It is tender to palpation, believe this needs to be surgically drained.  We will incise and drain the abscess today, and I also want to place him on  antibiotics, so I am going to additionally make sure he has coverage for MRSA.  I&D Indication: suspect abscess Pt complaints of: erythema, pain, swelling Location: R axilla Size: 2.5 cm Verbal informed consent obtained.  Pt aware of risks not limited to but including infection, bleeding, damage to near by organs. Prep: chloraprep Anesthesia: 1%lidocaine with epi, good effect Incision made with #11 blade Would explored and loculations removed Amount expressed: 3 mL Tolerated well Routine postprocedure instructions d/w pt- remove packing in 24-48h, keep area clean and bandaged, follow up if concerns/spreading erythema/pain.   Medication Management during today's office visit: Meds ordered this encounter  Medications   doxycycline (VIBRA-TABS) 100 MG tablet    Sig: Take 1 tablet (100 mg total) by mouth 2 (two) times daily.    Dispense:  20 tablet    Refill:  0   Medications Discontinued During This Encounter  Medication Reason   Magnesium Oxide -Mg Supplement 200 MG TABS Dose change    Orders placed today for conditions managed today: No orders of the defined types were placed in this encounter.   Disposition: No follow-ups on file.  Dragon [employer] One speech-to-text software was used for transcription in this dictation.  Possible transcriptional errors can occur using Animal nutritionist.   Signed,  Elpidio Galea. Jacquan Savas, MD   Outpatient Encounter  Medications as of 01/16/2024  Medication Sig   Alirocumab (PRALUENT) 150 MG/ML SOAJ Inject 1 mL (150 mg total) into the skin every 14 (fourteen) days.   aspirin EC 81 MG tablet Take 81 mg by mouth daily.   atorvastatin (LIPITOR) 40 MG tablet TAKE 1 TABLET BY MOUTH EVERY DAY   clopidogrel (PLAVIX) 75 MG tablet TAKE 1 TABLET BY MOUTH EVERY DAY   doxycycline (VIBRA-TABS) 100 MG tablet Take 1 tablet (100 mg total) by mouth 2 (two) times daily.   FARXIGA 10 MG TABS tablet TAKE 1 TABLET BY MOUTH DAILY BEFORE BREAKFAST.   lisinopril  (ZESTRIL) 5 MG tablet TAKE 1 TABLET (5 MG TOTAL) BY MOUTH DAILY.   Magnesium 250 MG TABS Take 1 tablet by mouth daily.   metFORMIN (GLUCOPHAGE) 500 MG tablet TAKE 2 TABLETS (1,000 MG TOTAL) BY MOUTH 2 (TWO) TIMES DAILY WITH A MEAL.   metoprolol succinate (TOPROL-XL) 25 MG 24 hr tablet TAKE 1 TABLET BY MOUTH TWICE A DAY   [DISCONTINUED] Magnesium Oxide -Mg Supplement 200 MG TABS Take 1 tablet (200 mg total) by mouth daily. (Patient taking differently: Take 250 mg by mouth daily.)   No facility-administered encounter medications on file as of 01/16/2024.

## 2024-01-19 LAB — WOUND CULTURE
MICRO NUMBER:: 16226595
SPECIMEN QUALITY:: ADEQUATE

## 2024-01-21 ENCOUNTER — Encounter: Payer: Self-pay | Admitting: Family Medicine

## 2024-01-27 ENCOUNTER — Other Ambulatory Visit: Payer: Self-pay | Admitting: Nurse Practitioner

## 2024-01-27 DIAGNOSIS — K219 Gastro-esophageal reflux disease without esophagitis: Secondary | ICD-10-CM

## 2024-02-26 ENCOUNTER — Encounter: Payer: Self-pay | Admitting: Internal Medicine

## 2024-02-26 ENCOUNTER — Ambulatory Visit: Payer: Medicare HMO | Attending: Internal Medicine | Admitting: Internal Medicine

## 2024-02-26 VITALS — BP 120/80 | HR 90 | Ht 71.0 in | Wt 176.1 lb

## 2024-02-26 DIAGNOSIS — I2510 Atherosclerotic heart disease of native coronary artery without angina pectoris: Secondary | ICD-10-CM | POA: Diagnosis not present

## 2024-02-26 DIAGNOSIS — I10 Essential (primary) hypertension: Secondary | ICD-10-CM | POA: Diagnosis not present

## 2024-02-26 DIAGNOSIS — E785 Hyperlipidemia, unspecified: Secondary | ICD-10-CM

## 2024-02-26 DIAGNOSIS — I255 Ischemic cardiomyopathy: Secondary | ICD-10-CM

## 2024-02-26 DIAGNOSIS — I5032 Chronic diastolic (congestive) heart failure: Secondary | ICD-10-CM

## 2024-02-26 DIAGNOSIS — E1169 Type 2 diabetes mellitus with other specified complication: Secondary | ICD-10-CM

## 2024-02-26 NOTE — Progress Notes (Signed)
 Cardiology Office Note:  .   Date:  02/26/2024  ID:  John Mathis, DOB 1961-03-23, MRN [id number] PCP: Dorothe Gaster, NP  [facility] Providers Cardiologist:  Sammy Crisp, MD     History of Present Illness: .   John Mathis is a 63 y.o. male with history of CAD with report of 14 prior stent placements while living in [place] , ischemic cardiomyopathy with LVEF of 30-35% by echo in 2017 but found to be 50-55% in 10/2017, hypertension, hyperlipidemia, diabetes mellitus, and Lyme disease, who presents for follow-up of coronary artery disease.  He was last seen in January by Slater Duncan for follow-up hyperlipidemia.  He also participates in the [employer] study.  He was doing well from a heart standpoint but complained of some injection site discomfort with alirocumab .  He also stopped semaglutide  that was previously prescribed for management of his DM due to abdominal pain.  He was advised to continue alirocumab  injections every 3 weeks.  Today, Mr. Dusenbery reports that he has been doing fairly well.  He has been struggling some with seasonal allergies leading to some chest congestion.  This happens every year in the spring.  He denies chest pain, palpitations, lightheadedness, and edema.  He continues to walk 2 miles a day despite chronic right knee pain, for which she is wearing a brace today.  ROS: See HPI  Studies Reviewed: John Mathis   EKG Interpretation Date/Time:  Wednesday February 26 2024 10:08:10 EDT Ventricular Rate:  90 PR Interval:  136 QRS Duration:  80 QT Interval:  350 QTC Calculation: 428 R Axis:   46  Text Interpretation: Normal sinus rhythm Possible Inferior infarct , age undetermined When compared with ECG of 25-Mar-2018 11:05, Abnormal ECG No significant change was found Confirmed by John Mathis, John Mathis [phone]) on 02/26/2024 1:45:46 PM    TTE (11/18/17): Mildly dilated LV with normal wall thickness.  LVEF 50-55% with normal wall motion and diastolic function.   Normal RV size and function.  TTE (01/18/16, [facility], [place], [place]): Normal LV size.  LVEF 30-35% with inferior hypokinesis.  Trace aortic and mild mitral regurgitation.  Trace to mild tricuspid regurgitation.  Normal RV size and function.  Risk Assessment/Calculations:             Physical Exam:   VS:  BP 120/80 (BP Location: Left Arm, Patient Position: Sitting, Cuff Size: Normal)   Pulse 90   Ht 5\' 11"  (1.803 m)   Wt 176 lb 2 oz (79.9 kg)   SpO2 98%   BMI 24.56 kg/m    Wt Readings from Last 3 Encounters:  02/26/24 176 lb 2 oz (79.9 kg)  01/16/24 176 lb 4 oz (79.9 kg)  01/09/24 176 lb 11.2 oz (80.2 kg)    General:  NAD. Neck: No JVD or HJR. Lungs: Clear to auscultation bilaterally without wheezes or crackles. Heart: Regular rate and rhythm without murmurs, rubs, or gallops. Abdomen: Soft, nontender, nondistended. Extremities: No lower extremity edema.  ASSESSMENT AND PLAN: .    Coronary artery disease: No angina reported.  Continue secondary prevention with current regimen of aspirin, atorvastatin , and alirocumab .  Heart failure with improved ejection fraction due to ischemic cardiomyopathy: [name] appears euvolemic with NYHA class I-II symptoms.  Continue current regimen of dapagliflozin , lisinopril , and metoprolol  succinate.  Hypertension: Diastolic blood pressure borderline today though other recent readings have been better.  Continue current regimen of lisinopril  and metoprolol  succinate.  Hyperlipidemia associated with type 2 diabetes mellitus:  Continue alirocumab  and atorvastatin  under the direction of Dr. Maximo Spar.  I have provided John Mathis with a new lab requisition to have the previously ordered lipid NMR drawn at his convenience (should be done fasting).  Ongoing management of DM per John Mathis.    Dispo: Return to clinic in 1 year.  Signed, Sammy Crisp, MD

## 2024-02-26 NOTE — Patient Instructions (Signed)
 Medication Instructions:  Your physician recommends that you continue on your current medications as directed. Please refer to the Current Medication list given to you today.   *If you need a refill on your cardiac medications before your next appointment, please call your pharmacy*  Lab Work: No labs ordered today   Testing/Procedures: No test ordered today   Follow-Up: At [facility], you and your health needs are our priority.  As part of our continuing mission to provide you with exceptional heart care, our providers are all part of one team.  This team includes your primary Cardiologist (physician) and Advanced Practice Providers or APPs (Physician Assistants and Nurse Practitioners) who all work together to provide you with the care you need, when you need it.  Your next appointment:   1 year(s)  Provider:   You may see Yvonne Kendall, MD or one of the following Advanced Practice Providers on your designated Care Team:   Nicolasa Ducking, NP Ames Dura, PA-C Eula Listen, PA-C Cadence Grayson, PA-C Charlsie Quest, NP Carlos Levering, NP    We recommend signing up for the patient portal called "MyChart".  Sign up information is provided on this After Visit Summary.  MyChart is used to connect with patients for Virtual Visits (Telemedicine).  Patients are able to view lab/test results, encounter notes, upcoming appointments, etc.  Non-urgent messages can be sent to your provider as well.   To learn more about what you can do with MyChart, go to [email or web address].

## 2024-03-06 LAB — HM DIABETES EYE EXAM

## 2024-04-23 ENCOUNTER — Other Ambulatory Visit: Payer: Self-pay | Admitting: Internal Medicine

## 2024-04-24 ENCOUNTER — Other Ambulatory Visit: Payer: Self-pay | Admitting: Internal Medicine

## 2024-04-30 ENCOUNTER — Other Ambulatory Visit: Payer: Self-pay | Admitting: Nurse Practitioner

## 2024-04-30 DIAGNOSIS — E119 Type 2 diabetes mellitus without complications: Secondary | ICD-10-CM

## 2024-05-19 ENCOUNTER — Telehealth: Payer: Self-pay | Admitting: Nurse Practitioner

## 2024-05-19 NOTE — Telephone Encounter (Signed)
 Patient dropped off ppwk for DMV form to be completed by provider placed in box at front desk

## 2024-05-21 NOTE — Telephone Encounter (Signed)
Form has been completed and placed in the outgoing MA box

## 2024-05-21 NOTE — Telephone Encounter (Signed)
 Left detailed voicemail for patient to call the office back if any concerns or questions.

## 2024-05-21 NOTE — Telephone Encounter (Signed)
 Ppw has been placed in pcp box for review.

## 2024-05-22 NOTE — Telephone Encounter (Signed)
 Patient arrived in office, and picked form

## 2024-07-01 ENCOUNTER — Telehealth: Payer: Self-pay | Admitting: Internal Medicine

## 2024-07-01 ENCOUNTER — Encounter: Payer: Self-pay | Admitting: *Deleted

## 2024-07-01 DIAGNOSIS — Z006 Encounter for examination for normal comparison and control in clinical research program: Secondary | ICD-10-CM

## 2024-07-01 NOTE — Telephone Encounter (Signed)
   Pre-operative Risk Assessment    Patient Name: Kenna Kirn  DOB: 1961-05-08 MRN: [id number]   Date of last office visit: unknown Date of next office visit: unknown  Request for Surgical Clearance    Procedure:  Dental Extraction - Amount of Teeth to be Pulled:  cleaning, filling and crown   Date of Surgery:  Clearance TBD                                Surgeon:  Dr. Debby Socks Group or Practice Name:  [facility] Phone number:  [phone] Fax number:  [phone]   Type of Clearance Requested:   - Medical    Type of Anesthesia:  Local    Additional requests/questions:    SignedBerwyn LELON Sprung   07/01/2024, 11:24 AM

## 2024-07-01 NOTE — Research (Signed)
 Text sent to John Mathis to inform him that he received the study drug while he was in the study. Also encouraged him to follow up with Dr End related to his TGs. Thanked him for being in the study. Dr End informed of above.

## 2024-07-01 NOTE — Telephone Encounter (Signed)
   Patient Name: John Mathis  DOB: 1961/03/18 MRN: [id number]  Primary Cardiologist: Lonni Hanson, MD  Chart reviewed as part of pre-operative protocol coverage.   IF SIMPLE EXTRACTION/CLEANINGS: Simple dental extractions (i.e. 1-2 teeth) and cleanings are considered low risk procedures per guidelines and generally do not require any specific cardiac clearance. It is also generally accepted that for simple extractions and dental cleanings, there is no need to interrupt blood thinner therapy.   SBE prophylaxis is not required for the patient from a cardiac standpoint.  I will route this recommendation to the requesting party via Epic fax function and remove from pre-op pool.  Please call with questions.  Rollo FABIENE Louder, PA-C 07/01/2024, 11:43 AM

## 2024-07-31 ENCOUNTER — Other Ambulatory Visit: Payer: Self-pay | Admitting: Nurse Practitioner

## 2024-07-31 DIAGNOSIS — E119 Type 2 diabetes mellitus without complications: Secondary | ICD-10-CM

## 2024-07-31 NOTE — Telephone Encounter (Signed)
 Overdue for DM2 follow up visit.  Please schedule in the next 30 days

## 2024-08-30 ENCOUNTER — Other Ambulatory Visit: Payer: Self-pay | Admitting: Nurse Practitioner

## 2024-09-03 ENCOUNTER — Other Ambulatory Visit: Payer: Self-pay | Admitting: Nurse Practitioner

## 2024-09-03 DIAGNOSIS — E119 Type 2 diabetes mellitus without complications: Secondary | ICD-10-CM

## 2024-09-04 ENCOUNTER — Telehealth: Payer: Self-pay | Admitting: *Deleted

## 2024-09-04 ENCOUNTER — Ambulatory Visit: Admitting: Nurse Practitioner

## 2024-09-04 VITALS — BP 114/76 | HR 75 | Temp 98.0°F | Ht 71.0 in | Wt 176.4 lb

## 2024-09-04 DIAGNOSIS — I10 Essential (primary) hypertension: Secondary | ICD-10-CM | POA: Diagnosis not present

## 2024-09-04 DIAGNOSIS — Z125 Encounter for screening for malignant neoplasm of prostate: Secondary | ICD-10-CM | POA: Diagnosis not present

## 2024-09-04 DIAGNOSIS — Z720 Tobacco use: Secondary | ICD-10-CM

## 2024-09-04 DIAGNOSIS — Z7984 Long term (current) use of oral hypoglycemic drugs: Secondary | ICD-10-CM

## 2024-09-04 DIAGNOSIS — E119 Type 2 diabetes mellitus without complications: Secondary | ICD-10-CM | POA: Diagnosis not present

## 2024-09-04 DIAGNOSIS — Z126 Encounter for screening for malignant neoplasm of bladder: Secondary | ICD-10-CM

## 2024-09-04 LAB — POCT GLYCOSYLATED HEMOGLOBIN (HGB A1C): Hemoglobin A1C: 8.5 % — AB (ref 4.0–5.6)

## 2024-09-04 LAB — COMPREHENSIVE METABOLIC PANEL WITH GFR
ALT: 19 U/L (ref 0–53)
AST: 15 U/L (ref 0–37)
Albumin: 4.7 g/dL (ref 3.5–5.2)
Alkaline Phosphatase: 111 U/L (ref 39–117)
BUN: 14 mg/dL (ref 6–23)
CO2: 30 meq/L (ref 19–32)
Calcium: 10 mg/dL (ref 8.4–10.5)
Chloride: 100 meq/L (ref 96–112)
Creatinine, Ser: 0.88 mg/dL (ref 0.40–1.50)
GFR: 91.85 mL/min (ref >60.00)
Glucose, Bld: 216 mg/dL — ABNORMAL HIGH (ref 70–99)
Potassium: 4 meq/L (ref 3.5–5.1)
Sodium: 139 meq/L (ref 135–145)
Total Bilirubin: 1 mg/dL (ref 0.2–1.2)
Total Protein: 6.9 g/dL (ref 6.0–8.3)

## 2024-09-04 LAB — CBC
HCT: 54 % — ABNORMAL HIGH (ref 39.0–52.0)
Hemoglobin: 18.9 g/dL (ref 13.0–17.0)
MCHC: 35 g/dL (ref 30.0–36.0)
MCV: 90.5 fl (ref 78.0–100.0)
Platelets: 188 K/uL (ref 150.0–400.0)
RBC: 5.97 Mil/uL — ABNORMAL HIGH (ref 4.22–5.81)
RDW: 13.4 % (ref 11.5–15.5)
WBC: 7.5 K/uL (ref 4.0–10.5)

## 2024-09-04 LAB — MICROALBUMIN / CREATININE URINE RATIO
Creatinine,U: 46.3 mg/dL
Microalb Creat Ratio: UNDETERMINED mg/g (ref 0.0–30.0)
Microalb, Ur: <0.7 mg/dL

## 2024-09-04 LAB — LIPID PANEL
Cholesterol: 146 mg/dL (ref 0–200)
HDL: 34.5 mg/dL — ABNORMAL LOW (ref >39.00)
LDL Cholesterol: 38 mg/dL (ref 0–99)
NonHDL: 111.96
Total CHOL/HDL Ratio: 4
Triglycerides: 371 mg/dL — ABNORMAL HIGH (ref 0.0–149.0)
VLDL: 74.2 mg/dL — ABNORMAL HIGH (ref 0.0–40.0)

## 2024-09-04 LAB — URINALYSIS, MICROSCOPIC ONLY: RBC / HPF: NONE SEEN (ref 0–?)

## 2024-09-04 LAB — TSH: TSH: 1.81 u[IU]/mL (ref 0.35–5.50)

## 2024-09-04 LAB — PSA, MEDICARE: PSA: 0.73 ng/mL (ref 0.10–4.00)

## 2024-09-04 MED ORDER — LANCETS MISC: 1.0000 | Freq: Every day | 1 refills | Status: AC

## 2024-09-04 MED ORDER — LANCET DEVICE MISC: 1.0000 | 0 refills | Status: AC

## 2024-09-04 MED ORDER — BLOOD GLUCOSE MONITORING SUPPL DEVI: 1.0000 | 0 refills | Status: AC

## 2024-09-04 MED ORDER — GLIPIZIDE 5 MG PO TABS: 5.0000 mg | ORAL_TABLET | Freq: Two times a day (BID) | ORAL | 0 refills | Status: AC

## 2024-09-04 MED ORDER — BLOOD GLUCOSE TEST VI STRP: 1.0000 | ORAL_STRIP | Freq: Every day | 1 refills | Status: AC

## 2024-09-04 NOTE — Progress Notes (Signed)
 Established Patient Office Visit  Subjective   Patient ID: John Mathis, male    DOB: [birth year]  Age: 63 y.o. MRN: [id number]  Chief Complaint  Patient presents with   Diabetes     Discussed the use of AI scribe software for clinical note transcription with the patient, who gave verbal consent to proceed.  History of Present Illness John Mathis is a 63 year old male with type 2 diabetes who presents for follow-up on blood sugar management.  He does not monitor his blood sugar levels at home and has not experienced any symptoms of hypoglycemia. He maintains a diet of three meals a day with snacks, which he describes as 'same as always.'  He is currently taking Farxiga  10 mg and metformin , with a regimen of two tablets in the morning and one in the evening, totaling 1500 mg daily. He occasionally misses doses, particularly when traveling, as he did recently when he forgot his medication during a trip to [place] .  His last hemoglobin A1c was 6.5% in December of the previous year.  No recent fever, chills, chest pain, or shortness of breath, and he reports normal bowel movements. He has not received a flu shot and recalls having the flu once approximately 26 years ago when his last child was born.    Review of Systems  Constitutional:  Negative for chills and fever.  Respiratory:  Negative for shortness of breath.   Cardiovascular:  Negative for chest pain.      Objective:     BP 114/76   Pulse 75   Temp 98 F (36.7 C) (Oral)   Ht 5' 11 (1.803 m)   Wt 176 lb 6.4 oz (80 kg)   SpO2 98%   BMI 24.60 kg/m  BP Readings from Last 3 Encounters:  09/04/24 114/76  02/26/24 120/80  01/16/24 90/70   Wt Readings from Last 3 Encounters:  09/04/24 176 lb 6.4 oz (80 kg)  02/26/24 176 lb 2 oz (79.9 kg)  01/16/24 176 lb 4 oz (79.9 kg)   SpO2 Readings from Last 3 Encounters:  09/04/24 98%  02/26/24 98%  01/16/24 97%      Physical Exam Vitals and nursing note  reviewed.  Constitutional:      Appearance: Normal appearance.  Cardiovascular:     Rate and Rhythm: Normal rate and regular rhythm.     Heart sounds: Normal heart sounds.  Pulmonary:     Effort: Pulmonary effort is normal.     Breath sounds: Normal breath sounds.  Neurological:     Mental Status: He is alert.      Results for orders placed or performed in visit on 09/04/24  POCT glycosylated hemoglobin (Hb A1C)  Result Value Ref Range   Hemoglobin A1C 8.5 (A) 4.0 - 5.6 %   HbA1c POC (<> result, manual entry)     HbA1c, POC (prediabetic range)     HbA1c, POC (controlled diabetic range)        The ASCVD Risk score (Arnett DK, et al., 2019) failed to calculate for the following reasons:   Risk score cannot be calculated because patient has a medical history suggesting prior/existing ASCVD    Assessment & Plan:   Problem List Items Addressed This Visit       Cardiovascular and Mediastinum   Essential hypertension   Relevant Orders   TSH     Endocrine   Type 2 diabetes mellitus without complication, without long-term current use of insulin (HCC) -  Primary   Relevant Medications   glipiZIDE (GLUCOTROL) 5 MG tablet   Blood Glucose Monitoring Suppl DEVI   Glucose Blood (BLOOD GLUCOSE TEST STRIPS) STRP   Lancet Device MISC   Lancets MISC   Other Relevant Orders   POCT glycosylated hemoglobin (Hb A1C) (Completed)   CBC   Comprehensive metabolic panel with GFR   Lipid panel   Microalbumin / creatinine urine ratio     Other   Tobacco abuse   Relevant Orders   Urine Microscopic   Other Visit Diagnoses       Screening for prostate cancer       Relevant Orders   PSA, Medicare     Screening for bladder cancer       Relevant Orders   Urine Microscopic      Assessment and Plan Assessment & Plan Type 2 diabetes mellitus without complication Suboptimal glycemic control with increased Hemoglobin A1c from 6.5% to 8.5%. On maximum doses of Farxiga  and Metformin .  Discussed adding new oral medication due to preference to avoid injectables. - Prescribed new oral medication, glipizide 5 mg twice daily with food. - Provided glucose monitoring supplies for hypoglycemia. - Ordered blood tests to assess current status. - Scheduled follow-up in three months to reassess glycemic control.  Return in about 3 months (around 12/05/2024) for DM recheck.    Adina Crandall, NP

## 2024-09-04 NOTE — Telephone Encounter (Signed)
 Noted history of the same. He is followed by hematology  FYI to Hematology

## 2024-09-04 NOTE — Telephone Encounter (Signed)
 Jasmine at [employer] called with Critical lab results.  Pt has a critically high hemoglobin at 18.9  Results given to PCP

## 2024-09-04 NOTE — Patient Instructions (Signed)
 Nice to see you today Follow up with me in 3 months, sooner if you need me

## 2024-09-07 ENCOUNTER — Telehealth: Payer: Self-pay | Admitting: Oncology

## 2024-09-07 NOTE — Telephone Encounter (Signed)
 Per in basket-(MD/phlebotomy this week)   I called the pt to get him scheduled, no answer so I left a vm for him to return my call.

## 2024-09-08 ENCOUNTER — Ambulatory Visit: Payer: Self-pay | Admitting: Nurse Practitioner

## 2024-09-11 ENCOUNTER — Encounter: Payer: Self-pay | Admitting: *Deleted

## 2024-09-16 ENCOUNTER — Telehealth: Payer: Self-pay | Admitting: Pharmacy Technician

## 2024-09-16 NOTE — Telephone Encounter (Signed)
   Pharmacy Patient Advocate Encounter   Received notification from CoverMyMeds that prior authorization for praluent  is required/requested.   Insurance verification completed.   The patient is insured through [employer].   Per test claim: PA required; PA submitted to above mentioned insurance via Latent Key/confirmation #/EOC [id number] Status is pending

## 2024-09-16 NOTE — Telephone Encounter (Signed)
 Pharmacy Patient Advocate Encounter  Received notification from [facility] that Prior Authorization for praluent  has been APPROVED from 09/16/24 to 09/16/25   PA #/Case ID/Reference #: [id number]

## 2024-10-07 ENCOUNTER — Ambulatory Visit: Payer: Medicare HMO

## 2024-10-07 VITALS — Ht 71.0 in | Wt 176.0 lb

## 2024-10-07 DIAGNOSIS — Z0000 Encounter for general adult medical examination without abnormal findings: Secondary | ICD-10-CM | POA: Diagnosis not present

## 2024-10-07 NOTE — Progress Notes (Addendum)
 Chief Complaint  Patient presents with   Medicare Wellness     Subjective:   John Mathis is a 63 y.o. male who presents for a Medicare Annual Wellness Visit.  Visit info / Clinical Intake: Medicare Wellness Visit Type:: Subsequent Annual Wellness Visit Persons participating in visit and providing information:: patient Medicare Wellness Visit Mode:: Telephone If telephone:: video declined Since this visit was completed virtually, some vitals may be partially provided or unavailable. Missing vitals are due to the limitations of the virtual format.: Unable to obtain vitals - no equipment If Telephone or Video please confirm:: I connected with patient using audio/video enable telemedicine. I verified patient identity with two identifiers, discussed telehealth limitations, and patient agreed to proceed. Patient Location:: home Provider Location:: home office Interpreter Needed?: No Pre-visit prep was completed: yes AWV questionnaire completed by patient prior to visit?: no Living arrangements:: (!) lives alone Patient's Overall Health Status Rating: good Typical amount of pain: none Does pain affect daily life?: no Are you currently prescribed opioids?: no  Dietary Habits and Nutritional Risks How many meals a day?: 3 Eats fruit and vegetables daily?: (!) no Most meals are obtained by: preparing own meals In the last 2 weeks, have you had any of the following?: none Diabetic:: (!) yes Any non-healing wounds?: no How often do you check your BS?: 2 Would you like to be referred to a Nutritionist or for Diabetic Management? : no  Functional Status Activities of Daily Living (to include ambulation/medication): Independent Ambulation: Independent with device- listed below Home Assistive Devices/Equipment: [place] (far walks) Medication Administration: Independent Home Management (perform basic housework or laundry): Independent Manage your own finances?: yes Primary  transportation is: driving Concerns about vision?: no *vision screening is required for WTM* Concerns about hearing?: no  Fall Screening Falls in the past year?: 0 Number of falls in past year: 0 Was there an injury with Fall?: 0 Fall Risk Category Calculator: 0 Patient Fall Risk Level: Low Fall Risk  Fall Risk Patient at Risk for Falls Due to: No Fall Risks Fall risk Follow up: Education provided; Falls prevention discussed; Falls evaluation completed  Home and Transportation Safety: All rugs have non-skid backing?: N/A, no rugs All stairs or steps have railings?: yes Grab bars in the bathtub or shower?: yes Have non-skid surface in bathtub or shower?: (!) no Good home lighting?: (!) no Regular seat belt use?: yes Hospital stays in the last year:: no  Cognitive Assessment Difficulty concentrating, remembering, or making decisions? : no Will 6CIT or Mini Cog be Completed: yes What year is it?: 0 points What month is it?: 0 points Give patient an address phrase to remember (5 components): [address] [place]  About what time is it?: 0 points Count backwards from 20 to 1: 0 points Say the months of the year in reverse: 0 points Repeat the address phrase from earlier: 0 points 6 CIT Score: 0 points  Advance Directives (For Healthcare) Does Patient Have a Medical Advance Directive?: No Would patient like information on creating a medical advance directive?: No - Patient declined  Reviewed/Updated  Reviewed/Updated: Reviewed All (Medical, Surgical, Family, Medications, Allergies, Care Teams, Patient Goals)   Allergies (verified) Other, Icosapent  ethyl (epa ethyl ester) (fish), and Ivp dye [iodinated contrast media]   Current Medications (verified) Outpatient Encounter Medications as of 10/07/2024  Medication Sig   Alirocumab  (PRALUENT ) 150 MG/ML SOAJ Inject 1 mL (150 mg total) into the skin every 14 (fourteen) days.   aspirin EC 81  MG tablet Take 81 mg by  mouth daily.   atorvastatin  (LIPITOR) 40 MG tablet TAKE 1 TABLET BY MOUTH EVERY DAY   Blood Glucose Monitoring Suppl DEVI 1 each by Does not apply route as directed. Dispense based on patient and insurance preference. Use up to four times daily as directed. (FOR ICD-10 E10.9, E11.9).   clopidogrel  (PLAVIX ) 75 MG tablet TAKE 1 TABLET BY MOUTH EVERY DAY   dapagliflozin  propanediol (FARXIGA ) 10 MG TABS tablet TAKE 1 TABLET BY MOUTH DAILY BEFORE BREAKFAST.   glipiZIDE  (GLUCOTROL ) 5 MG tablet Take 1 tablet (5 mg total) by mouth 2 (two) times daily before a meal.   Glucose Blood (BLOOD GLUCOSE TEST STRIPS) STRP 1 each by Does not apply route daily. Dispense based on patient and insurance preference. Use up to four times daily as directed. (FOR ICD-10 E10.9, E11.9).   Lancet Device MISC 1 each by Does not apply route as directed. Dispense based on patient and insurance preference. Use up to four times daily as directed. (FOR ICD-10 E10.9, E11.9).   Lancets MISC 1 each by Does not apply route daily. Dispense based on patient and insurance preference. Use up to four times daily as directed. (FOR ICD-10 E10.9, E11.9).   lisinopril  (ZESTRIL ) 5 MG tablet TAKE 1 TABLET (5 MG TOTAL) BY MOUTH DAILY.   Magnesium  250 MG TABS Take 1 tablet by mouth daily.   metFORMIN  (GLUCOPHAGE ) 500 MG tablet TAKE 2 TABLETS (1,000 MG TOTAL) BY MOUTH 2 (TWO) TIMES DAILY WITH A MEAL.   metoprolol  succinate (TOPROL -XL) 25 MG 24 hr tablet TAKE 1 TABLET BY MOUTH TWICE A DAY   No facility-administered encounter medications on file as of 10/07/2024.    History: Past Medical History:  Diagnosis Date   Arthritis    CAD (coronary artery disease)    a. S/p multiple PCIs (report of 14 prior stents in [place]   CHF (congestive heart failure) (HCC)    Depression    Diabetes mellitus without complication (HCC)    Diverticulitis    Familial hyperlipidemia    a. 02/2019 seen in lipid clinic-->Praluent  started.   Hypertension    Ischemic  cardiomyopathy    a. 01/18/2016 Echo [facility] [place]): Nl LV size. EF 30-35% w/ inf HK. Trace AI, mild MR. Trace to mild TR. Nl RV size/fxn; b. 11/18/2017 Echo: Mildly dil LV w/ nl wall thickness. LVEF 50-55% w/ no rwma. Nl RV size/fxn.   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   Myocardial infarction (HCC)    X2   Past Surgical History:  Procedure Laterality Date   CARDIAC CATHETERIZATION  2012   x4 stents   CARDIAC CATHETERIZATION  2014   x6 stents   CARDIAC CATHETERIZATION  2014   x2 stents   HERNIA REPAIR     JOINT REPLACEMENT     partial then full   KNEE SURGERY Right    SHOULDER ARTHROSCOPY WITH OPEN ROTATOR CUFF REPAIR AND DISTAL CLAVICLE ACROMINECTOMY Left 05/22/2018   Procedure: SHOULDER ARTHROSCOPY WITH OPEN ROTATOR CUFF REPAIR, DISTAL CLAVICLE EXCISION, AND SUBACROMINAL DECOMPRESSION;  Surgeon: Marchia Drivers, MD;  Location: [facility] ORS;  Service: Orthopedics;  Laterality: Left;   SMALL INTESTINE SURGERY     TONSILLECTOMY     Family History  Problem Relation Age of Onset   Hyperlipidemia Mother    Other Father        parasite infection   Mental illness Maternal Grandmother    Social History   Occupational  History   Not on file  Tobacco Use   Smoking status: Every Day    Current packs/day: 1.25    Average packs/day: 1.3 packs/day for 43.0 years (53.8 ttl pk-yrs)    Types: Cigarettes   Smokeless tobacco: Never  Vaping Use   Vaping status: Never Used  Substance and Sexual Activity   Alcohol use: No   Drug use: No   Sexual activity: Not on file   Tobacco Counseling Ready to quit: Not Answered Counseling given: Not Answered  SDOH Screenings   Food Insecurity: No Food Insecurity (10/07/2024)  Housing: Unknown (10/07/2024)  Transportation Needs: No Transportation Needs (10/07/2024)  Utilities: Not At Risk (10/07/2024)  Alcohol Screen: Low Risk  (10/07/2024)  Depression (PHQ2-9): Low Risk  (10/07/2024)  Financial Resource Strain: Low  Risk  (10/07/2024)  Physical Activity: Sufficiently Active (10/07/2024)  Social Connections: Socially Isolated (10/07/2024)  Stress: No Stress Concern Present (10/07/2024)  Tobacco Use: High Risk (10/07/2024)  Health Literacy: Adequate Health Literacy (10/07/2024)   See flowsheets for full screening details  Depression Screen PHQ 2 & 9 Depression Scale- Over the past 2 weeks, how often have you been bothered by any of the following problems? Little interest or pleasure in doing things: 0 Feeling down, depressed, or hopeless (PHQ Adolescent also includes...irritable): 0 PHQ-2 Total Score: 0 Trouble falling or staying asleep, or sleeping too much: 0 Feeling tired or having little energy: 0 Poor appetite or overeating (PHQ Adolescent also includes...weight loss): 0 Feeling bad about yourself - or that you are a failure or have let yourself or your family down: 0 Trouble concentrating on things, such as reading the newspaper or watching television (PHQ Adolescent also includes...like school work): 0 Moving or speaking so slowly that other people could have noticed. Or the opposite - being so fidgety or restless that you have been moving around a lot more than usual: 0 Thoughts that you would be better off dead, or of hurting yourself in some way: 0 PHQ-9 Total Score: 0 If you checked off any problems, how difficult have these problems made it for you to do your work, take care of things at home, or get along with other people?: Not difficult at all     Goals Addressed             This Visit's Progress    Patient Stated   On track    Would like to maintain current routine            Objective:    Today's Vitals   10/07/24 1503  Weight: 176 lb (79.8 kg)  Height: 5' 11 (1.803 m)   Body mass index is 24.55 kg/m.  Hearing/Vision screen Vision Screening - Comments:: UTD w/visits to [employer] Immunizations and Health Maintenance Health Maintenance  Topic Date Due    DTaP/Tdap/Td (1 - Tdap) Never done   Colonoscopy  Never done   Zoster Vaccines- Shingrix (1 of 2) Never done   Pneumococcal Vaccine: 50+ Years (2 of 2 - PCV) 06/14/2020   COVID-19 Vaccine (3 - 2025-26 season) 06/29/2024   FOOT EXAM  09/30/2024   Influenza Vaccine  01/26/2025 (Originally 05/29/2024)   Lung Cancer Screening  11/25/2024   HEMOGLOBIN A1C  03/04/2025   OPHTHALMOLOGY EXAM  03/06/2025   Diabetic kidney evaluation - eGFR measurement  09/04/2025   Diabetic kidney evaluation - Urine ACR  09/04/2025   Medicare Annual Wellness (AWV)  10/07/2025   Hepatitis C Screening  Completed   HIV Screening  Completed   Hepatitis B Vaccines 19-59 Average Risk  Aged Out   HPV VACCINES  Aged Out   Meningococcal B Vaccine  Aged Out        Assessment/Plan:  This is a routine wellness examination for [facility].  Patient Care Team: Wendee Lynwood HERO, NP as PCP - General (Pain Medicine) End, Lonni, MD as PCP - Cardiology (Cardiology) Portia Fireman, OD [facility])  I have personally reviewed and noted the following in the patients chart:   Medical and social history Use of alcohol, tobacco or illicit drugs  Current medications and supplements including opioid prescriptions. Functional ability and status Nutritional status Physical activity Advanced directives List of other physicians Hospitalizations, surgeries, and ER visits in previous 12 months Vitals Screenings to include cognitive, depression, and falls Referrals and appointments  No orders of the defined types were placed in this encounter.  In addition, I have reviewed and discussed with patient certain preventive protocols, quality metrics, and best practice recommendations. A written personalized care plan for preventive services as well as general preventive health recommendations were provided to patient.   Erminio LITTIE Saris, LPN   [id number]    After Visit Summary: (MyChart) Due to this being a telephonic visit,  the after visit summary with patients personalized plan was offered to patient via MyChart   Nurse Notes: No voiced or noted concerns at this time Patient advised to keep follow-up appointment with PCP (March 2026) HM Addressed: pt declines vaccines recommended

## 2024-10-07 NOTE — Patient Instructions (Signed)
 [name],  Thank you for taking the time for your Medicare Wellness Visit. I appreciate your continued commitment to your health goals. Please review the care plan we discussed, and feel free to reach out if I can assist you further.  Please note that Annual Wellness Visits do not include a physical exam. Some assessments may be limited, especially if the visit was conducted virtually. If needed, we may recommend an in-person follow-up with your provider.  Ongoing Care Seeing your primary care provider every 3 to 6 months helps us  monitor your health and provide consistent, personalized care.   Referrals If a referral was made during today's visit and you haven't received any updates within two weeks, please contact the referred provider directly to check on the status.  Recommended Screenings:  Health Maintenance  Topic Date Due   DTaP/Tdap/Td vaccine (1 - Tdap) Never done   Colon Cancer Screening  Never done   Zoster (Shingles) Vaccine (1 of 2) Never done   Pneumococcal Vaccine for age over 98 (2 of 2 - PCV) 06/14/2020   COVID-19 Vaccine (3 - 2025-26 season) 06/29/2024   Medicare Annual Wellness Visit  10/06/2024   Complete foot exam   09/30/2024   Flu Shot  01/26/2025*   Screening for Lung Cancer  11/25/2024   Hemoglobin A1C  03/04/2025   Eye exam for diabetics  03/06/2025   Yearly kidney function blood test for diabetes  09/04/2025   Yearly kidney health urinalysis for diabetes  09/04/2025   Hepatitis C Screening  Completed   HIV Screening  Completed   Hepatitis B Vaccine  Aged Out   HPV Vaccine  Aged Out   Meningitis B Vaccine  Aged Out  *Topic was postponed. The date shown is not the original due date.       10/07/2024    3:10 PM  Advanced Directives  Does Patient Have a Medical Advance Directive? No    Vision: Annual vision screenings are recommended for early detection of glaucoma, cataracts, and diabetic retinopathy. These exams can also reveal signs of chronic  conditions such as diabetes and high blood pressure.  Dental: Annual dental screenings help detect early signs of oral cancer, gum disease, and other conditions linked to overall health, including heart disease and diabetes.

## 2025-01-06 ENCOUNTER — Encounter: Admitting: Nurse Practitioner
